# Patient Record
Sex: Female | Born: 1980 | Race: White | Hispanic: No | Marital: Single | Smoking: Former smoker
Health system: Southern US, Community
[De-identification: ages and names within clinical notes are randomized; demographics above are authoritative.]

## PROBLEM LIST (undated history)

## (undated) DIAGNOSIS — E669 Obesity, unspecified: Secondary | ICD-10-CM

## (undated) DIAGNOSIS — F419 Anxiety disorder, unspecified: Secondary | ICD-10-CM

## (undated) DIAGNOSIS — M1990 Unspecified osteoarthritis, unspecified site: Secondary | ICD-10-CM

## (undated) DIAGNOSIS — J449 Chronic obstructive pulmonary disease, unspecified: Secondary | ICD-10-CM

## (undated) DIAGNOSIS — J189 Pneumonia, unspecified organism: Secondary | ICD-10-CM

## (undated) DIAGNOSIS — E119 Type 2 diabetes mellitus without complications: Secondary | ICD-10-CM

## (undated) DIAGNOSIS — F319 Bipolar disorder, unspecified: Secondary | ICD-10-CM

## (undated) DIAGNOSIS — S2239XA Fracture of one rib, unspecified side, initial encounter for closed fracture: Secondary | ICD-10-CM

## (undated) DIAGNOSIS — F32A Depression, unspecified: Secondary | ICD-10-CM

## (undated) DIAGNOSIS — F329 Major depressive disorder, single episode, unspecified: Secondary | ICD-10-CM

## (undated) DIAGNOSIS — J439 Emphysema, unspecified: Secondary | ICD-10-CM

## (undated) HISTORY — PX: TUBAL LIGATION: SHX77

## (undated) HISTORY — PX: CHOLECYSTECTOMY: SHX55

---
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
## 2001-05-28 ENCOUNTER — Inpatient Hospital Stay (HOSPITAL_COMMUNITY): Admission: AD | Admit: 2001-05-28 | Discharge: 2001-05-28 | Payer: Self-pay | Admitting: *Deleted

## 2001-05-29 ENCOUNTER — Encounter: Admission: RE | Admit: 2001-05-29 | Discharge: 2001-05-29 | Payer: Self-pay | Admitting: *Deleted

## 2001-09-07 ENCOUNTER — Encounter: Admission: RE | Admit: 2001-09-07 | Discharge: 2001-09-07 | Payer: Self-pay | Admitting: Obstetrics and Gynecology

## 2001-10-22 ENCOUNTER — Inpatient Hospital Stay (HOSPITAL_COMMUNITY): Admission: AD | Admit: 2001-10-22 | Discharge: 2001-10-22 | Payer: Self-pay | Admitting: *Deleted

## 2001-11-26 ENCOUNTER — Ambulatory Visit (HOSPITAL_COMMUNITY): Admission: RE | Admit: 2001-11-26 | Discharge: 2001-11-26 | Payer: Self-pay | Admitting: *Deleted

## 2002-01-15 ENCOUNTER — Encounter: Admission: RE | Admit: 2002-01-15 | Discharge: 2002-01-15 | Payer: Self-pay | Admitting: *Deleted

## 2002-01-28 ENCOUNTER — Inpatient Hospital Stay (HOSPITAL_COMMUNITY): Admission: AD | Admit: 2002-01-28 | Discharge: 2002-01-28 | Payer: Self-pay | Admitting: *Deleted

## 2002-02-04 ENCOUNTER — Ambulatory Visit (HOSPITAL_COMMUNITY): Admission: RE | Admit: 2002-02-04 | Discharge: 2002-02-04 | Payer: Self-pay | Admitting: *Deleted

## 2002-03-07 ENCOUNTER — Encounter (INDEPENDENT_AMBULATORY_CARE_PROVIDER_SITE_OTHER): Payer: Self-pay

## 2002-03-07 ENCOUNTER — Other Ambulatory Visit: Admission: RE | Admit: 2002-03-07 | Discharge: 2002-03-07 | Payer: Self-pay | Admitting: *Deleted

## 2002-03-07 ENCOUNTER — Encounter: Admission: RE | Admit: 2002-03-07 | Discharge: 2002-03-07 | Payer: Self-pay | Admitting: Obstetrics and Gynecology

## 2002-04-12 ENCOUNTER — Inpatient Hospital Stay (HOSPITAL_COMMUNITY): Admission: AD | Admit: 2002-04-12 | Discharge: 2002-04-12 | Payer: Self-pay | Admitting: Family Medicine

## 2002-04-22 ENCOUNTER — Encounter (INDEPENDENT_AMBULATORY_CARE_PROVIDER_SITE_OTHER): Payer: Self-pay | Admitting: Specialist

## 2002-04-22 ENCOUNTER — Inpatient Hospital Stay (HOSPITAL_COMMUNITY): Admission: AD | Admit: 2002-04-22 | Discharge: 2002-04-25 | Payer: Self-pay | Admitting: Family Medicine

## 2002-04-28 ENCOUNTER — Inpatient Hospital Stay (HOSPITAL_COMMUNITY): Admission: AD | Admit: 2002-04-28 | Discharge: 2002-04-28 | Payer: Self-pay | Admitting: Obstetrics and Gynecology

## 2002-04-30 ENCOUNTER — Inpatient Hospital Stay (HOSPITAL_COMMUNITY): Admission: AD | Admit: 2002-04-30 | Discharge: 2002-04-30 | Payer: Self-pay | Admitting: Obstetrics and Gynecology

## 2002-11-14 ENCOUNTER — Encounter (INDEPENDENT_AMBULATORY_CARE_PROVIDER_SITE_OTHER): Payer: Self-pay | Admitting: *Deleted

## 2002-11-14 ENCOUNTER — Encounter: Admission: RE | Admit: 2002-11-14 | Discharge: 2002-11-14 | Payer: Self-pay | Admitting: Obstetrics and Gynecology

## 2002-11-14 ENCOUNTER — Other Ambulatory Visit: Admission: RE | Admit: 2002-11-14 | Discharge: 2002-11-14 | Payer: Self-pay | Admitting: Obstetrics and Gynecology

## 2002-11-14 ENCOUNTER — Encounter (INDEPENDENT_AMBULATORY_CARE_PROVIDER_SITE_OTHER): Payer: Self-pay

## 2002-11-22 ENCOUNTER — Emergency Department (HOSPITAL_COMMUNITY): Admission: EM | Admit: 2002-11-22 | Discharge: 2002-11-22 | Payer: Self-pay | Admitting: Emergency Medicine

## 2002-12-09 ENCOUNTER — Ambulatory Visit (HOSPITAL_COMMUNITY): Admission: RE | Admit: 2002-12-09 | Discharge: 2002-12-09 | Payer: Self-pay | Admitting: Orthopedic Surgery

## 2003-06-03 ENCOUNTER — Inpatient Hospital Stay (HOSPITAL_COMMUNITY): Admission: AD | Admit: 2003-06-03 | Discharge: 2003-06-03 | Payer: Self-pay | Admitting: *Deleted

## 2003-06-06 ENCOUNTER — Inpatient Hospital Stay (HOSPITAL_COMMUNITY): Admission: AD | Admit: 2003-06-06 | Discharge: 2003-06-06 | Payer: Self-pay | Admitting: *Deleted

## 2003-07-16 ENCOUNTER — Emergency Department (HOSPITAL_COMMUNITY): Admission: EM | Admit: 2003-07-16 | Discharge: 2003-07-16 | Payer: Self-pay | Admitting: Emergency Medicine

## 2003-09-04 ENCOUNTER — Encounter (INDEPENDENT_AMBULATORY_CARE_PROVIDER_SITE_OTHER): Payer: Self-pay | Admitting: Specialist

## 2003-09-04 ENCOUNTER — Encounter: Admission: RE | Admit: 2003-09-04 | Discharge: 2003-09-04 | Payer: Self-pay | Admitting: Obstetrics and Gynecology

## 2003-10-04 ENCOUNTER — Emergency Department (HOSPITAL_COMMUNITY): Admission: EM | Admit: 2003-10-04 | Discharge: 2003-10-04 | Payer: Self-pay | Admitting: Emergency Medicine

## 2004-01-04 ENCOUNTER — Emergency Department (HOSPITAL_COMMUNITY): Admission: EM | Admit: 2004-01-04 | Discharge: 2004-01-04 | Payer: Self-pay | Admitting: Family Medicine

## 2004-01-12 ENCOUNTER — Emergency Department (HOSPITAL_COMMUNITY): Admission: EM | Admit: 2004-01-12 | Discharge: 2004-01-12 | Payer: Self-pay | Admitting: Family Medicine

## 2005-03-15 ENCOUNTER — Ambulatory Visit: Payer: Self-pay | Admitting: *Deleted

## 2005-03-15 ENCOUNTER — Encounter (INDEPENDENT_AMBULATORY_CARE_PROVIDER_SITE_OTHER): Payer: Self-pay | Admitting: *Deleted

## 2005-04-27 ENCOUNTER — Other Ambulatory Visit: Admission: RE | Admit: 2005-04-27 | Discharge: 2005-04-27 | Payer: Self-pay | Admitting: Obstetrics & Gynecology

## 2005-04-27 ENCOUNTER — Ambulatory Visit: Payer: Self-pay | Admitting: Obstetrics & Gynecology

## 2005-05-12 ENCOUNTER — Ambulatory Visit: Payer: Self-pay | Admitting: Family Medicine

## 2005-05-13 ENCOUNTER — Ambulatory Visit (HOSPITAL_COMMUNITY): Admission: RE | Admit: 2005-05-13 | Discharge: 2005-05-13 | Payer: Self-pay | Admitting: *Deleted

## 2005-05-30 ENCOUNTER — Ambulatory Visit (HOSPITAL_COMMUNITY): Admission: RE | Admit: 2005-05-30 | Discharge: 2005-05-30 | Payer: Self-pay | Admitting: Family Medicine

## 2005-05-30 ENCOUNTER — Ambulatory Visit: Payer: Self-pay | Admitting: Family Medicine

## 2005-05-30 ENCOUNTER — Encounter (INDEPENDENT_AMBULATORY_CARE_PROVIDER_SITE_OTHER): Payer: Self-pay | Admitting: Specialist

## 2005-06-23 ENCOUNTER — Ambulatory Visit: Payer: Self-pay | Admitting: Obstetrics and Gynecology

## 2005-07-07 ENCOUNTER — Emergency Department (HOSPITAL_COMMUNITY): Admission: EM | Admit: 2005-07-07 | Discharge: 2005-07-07 | Payer: Self-pay | Admitting: Family Medicine

## 2005-10-31 ENCOUNTER — Inpatient Hospital Stay (HOSPITAL_COMMUNITY): Admission: AD | Admit: 2005-10-31 | Discharge: 2005-10-31 | Payer: Self-pay | Admitting: Gynecology

## 2005-11-16 ENCOUNTER — Emergency Department (HOSPITAL_COMMUNITY): Admission: EM | Admit: 2005-11-16 | Discharge: 2005-11-16 | Payer: Self-pay | Admitting: Emergency Medicine

## 2006-02-09 ENCOUNTER — Ambulatory Visit: Payer: Self-pay | Admitting: Obstetrics & Gynecology

## 2006-02-09 ENCOUNTER — Encounter: Payer: Self-pay | Admitting: Obstetrics and Gynecology

## 2006-02-26 ENCOUNTER — Emergency Department (HOSPITAL_COMMUNITY): Admission: EM | Admit: 2006-02-26 | Discharge: 2006-02-26 | Payer: Self-pay | Admitting: Family Medicine

## 2006-07-15 ENCOUNTER — Emergency Department (HOSPITAL_COMMUNITY): Admission: EM | Admit: 2006-07-15 | Discharge: 2006-07-15 | Payer: Self-pay | Admitting: Emergency Medicine

## 2006-07-20 ENCOUNTER — Ambulatory Visit: Payer: Self-pay | Admitting: Internal Medicine

## 2006-08-23 ENCOUNTER — Encounter: Payer: Self-pay | Admitting: Obstetrics and Gynecology

## 2006-08-23 ENCOUNTER — Ambulatory Visit: Payer: Self-pay | Admitting: Obstetrics and Gynecology

## 2006-09-22 ENCOUNTER — Emergency Department (HOSPITAL_COMMUNITY): Admission: EM | Admit: 2006-09-22 | Discharge: 2006-09-22 | Payer: Self-pay | Admitting: Emergency Medicine

## 2006-10-27 ENCOUNTER — Inpatient Hospital Stay (HOSPITAL_COMMUNITY): Admission: AD | Admit: 2006-10-27 | Discharge: 2006-10-27 | Payer: Self-pay | Admitting: Gynecology

## 2007-01-01 ENCOUNTER — Ambulatory Visit: Payer: Self-pay | Admitting: Internal Medicine

## 2007-02-07 ENCOUNTER — Ambulatory Visit: Payer: Self-pay | Admitting: Internal Medicine

## 2007-02-07 LAB — CONVERTED CEMR LAB
ALT: 12 units/L (ref 0–35)
AST: 11 units/L (ref 0–37)
Albumin: 4.4 g/dL (ref 3.5–5.2)
Alkaline Phosphatase: 69 units/L (ref 39–117)
BUN: 8 mg/dL (ref 6–23)
Basophils Absolute: 0 10*3/uL (ref 0.0–0.1)
Basophils Relative: 0 % (ref 0–1)
CO2: 24 meq/L (ref 19–32)
Calcium: 9.2 mg/dL (ref 8.4–10.5)
Chloride: 107 meq/L (ref 96–112)
Cholesterol: 145 mg/dL (ref 0–200)
Creatinine, Ser: 0.69 mg/dL (ref 0.40–1.20)
Eosinophils Absolute: 0.3 10*3/uL (ref 0.0–0.7)
Eosinophils Relative: 3 % (ref 0–5)
Glucose, Bld: 93 mg/dL (ref 70–99)
HCT: 42.3 % (ref 36.0–46.0)
HDL: 39 mg/dL — ABNORMAL LOW (ref >39)
Hemoglobin: 13.4 g/dL (ref 12.0–15.0)
LDL Cholesterol: 90 mg/dL (ref 0–99)
Lymphocytes Relative: 35 % (ref 12–46)
Lymphs Abs: 3.3 10*3/uL (ref 0.7–4.0)
MCHC: 31.7 g/dL (ref 30.0–36.0)
MCV: 90.2 fL (ref 78.0–100.0)
Monocytes Absolute: 0.6 10*3/uL (ref 0.1–1.0)
Monocytes Relative: 7 % (ref 3–12)
Neutro Abs: 5.3 10*3/uL (ref 1.7–7.7)
Neutrophils Relative %: 56 % (ref 43–77)
Platelets: 321 10*3/uL (ref 150–400)
Potassium: 4.3 meq/L (ref 3.5–5.3)
RBC: 4.69 M/uL (ref 3.87–5.11)
RDW: 13.9 % (ref 11.5–15.5)
Sodium: 140 meq/L (ref 135–145)
TSH: 2.118 microintl units/mL (ref 0.350–5.50)
Total Bilirubin: 0.5 mg/dL (ref 0.3–1.2)
Total CHOL/HDL Ratio: 3.7
Total Protein: 6.7 g/dL (ref 6.0–8.3)
Triglycerides: 79 mg/dL (ref <150)
VLDL: 16 mg/dL (ref 0–40)
WBC: 9.5 10*3/uL (ref 4.0–10.5)

## 2007-02-21 ENCOUNTER — Emergency Department (HOSPITAL_COMMUNITY): Admission: EM | Admit: 2007-02-21 | Discharge: 2007-02-21 | Payer: Self-pay | Admitting: Family Medicine

## 2007-05-31 ENCOUNTER — Ambulatory Visit: Payer: Self-pay | Admitting: Internal Medicine

## 2007-08-22 ENCOUNTER — Ambulatory Visit: Payer: Self-pay | Admitting: Internal Medicine

## 2007-11-09 ENCOUNTER — Encounter: Payer: Self-pay | Admitting: Obstetrics & Gynecology

## 2007-11-09 ENCOUNTER — Ambulatory Visit: Payer: Self-pay | Admitting: Obstetrics & Gynecology

## 2007-11-09 LAB — CONVERTED CEMR LAB

## 2007-11-14 ENCOUNTER — Emergency Department (HOSPITAL_COMMUNITY): Admission: EM | Admit: 2007-11-14 | Discharge: 2007-11-14 | Payer: Self-pay | Admitting: Family Medicine

## 2007-11-15 ENCOUNTER — Ambulatory Visit: Payer: Self-pay | Admitting: Internal Medicine

## 2008-03-27 ENCOUNTER — Emergency Department (HOSPITAL_COMMUNITY): Admission: EM | Admit: 2008-03-27 | Discharge: 2008-03-27 | Payer: Self-pay | Admitting: Emergency Medicine

## 2008-04-09 ENCOUNTER — Ambulatory Visit: Payer: Self-pay | Admitting: Internal Medicine

## 2008-04-15 ENCOUNTER — Ambulatory Visit: Payer: Self-pay | Admitting: Diagnostic Radiology

## 2008-04-15 ENCOUNTER — Emergency Department (HOSPITAL_BASED_OUTPATIENT_CLINIC_OR_DEPARTMENT_OTHER): Admission: EM | Admit: 2008-04-15 | Discharge: 2008-04-15 | Payer: Self-pay | Admitting: Emergency Medicine

## 2008-05-02 ENCOUNTER — Ambulatory Visit: Payer: Self-pay | Admitting: Family Medicine

## 2008-05-02 ENCOUNTER — Encounter: Payer: Self-pay | Admitting: Obstetrics and Gynecology

## 2008-05-02 LAB — CONVERTED CEMR LAB
ALT: <8 units/L (ref 0–35)
AST: 9 units/L (ref 0–37)
Albumin: 4.3 g/dL (ref 3.5–5.2)
Alkaline Phosphatase: 57 units/L (ref 39–117)
BUN: 5 mg/dL — ABNORMAL LOW (ref 6–23)
CO2: 21 meq/L (ref 19–32)
Calcium: 9 mg/dL (ref 8.4–10.5)
Chloride: 110 meq/L (ref 96–112)
Creatinine, Ser: 0.64 mg/dL (ref 0.40–1.20)
Glucose, Bld: 81 mg/dL (ref 70–99)
HCT: 37.7 % (ref 36.0–46.0)
HCV Ab: NEGATIVE
Hemoglobin: 12.1 g/dL (ref 12.0–15.0)
MCHC: 32.1 g/dL (ref 30.0–36.0)
MCV: 88.7 fL (ref 78.0–100.0)
Platelets: 292 10*3/uL (ref 150–400)
Potassium: 4 meq/L (ref 3.5–5.3)
RBC: 4.25 M/uL (ref 3.87–5.11)
RDW: 14.3 % (ref 11.5–15.5)
Sodium: 140 meq/L (ref 135–145)
TSH: 1.504 microintl units/mL (ref 0.350–4.500)
Total Bilirubin: 0.5 mg/dL (ref 0.3–1.2)
Total Protein: 6.3 g/dL (ref 6.0–8.3)
WBC: 9.1 10*3/uL (ref 4.0–10.5)

## 2008-05-05 ENCOUNTER — Ambulatory Visit (HOSPITAL_COMMUNITY): Admission: RE | Admit: 2008-05-05 | Discharge: 2008-05-05 | Payer: Self-pay | Admitting: Family Medicine

## 2008-05-20 ENCOUNTER — Ambulatory Visit: Payer: Self-pay | Admitting: Internal Medicine

## 2008-05-20 LAB — CONVERTED CEMR LAB
ALT: <8 units/L (ref 0–35)
AST: 9 units/L (ref 0–37)
Albumin: 4.3 g/dL (ref 3.5–5.2)
Alkaline Phosphatase: 52 units/L (ref 39–117)
BUN: 7 mg/dL (ref 6–23)
Basophils Absolute: 0 10*3/uL (ref 0.0–0.1)
Basophils Relative: 0 % (ref 0–1)
CO2: 22 meq/L (ref 19–32)
Calcium: 9.1 mg/dL (ref 8.4–10.5)
Chloride: 110 meq/L (ref 96–112)
Creatinine, Ser: 0.66 mg/dL (ref 0.40–1.20)
Eosinophils Absolute: 0.2 10*3/uL (ref 0.0–0.7)
Eosinophils Relative: 2 % (ref 0–5)
Glucose, Bld: 87 mg/dL (ref 70–99)
HCT: 37.7 % (ref 36.0–46.0)
Hemoglobin: 12.7 g/dL (ref 12.0–15.0)
Lymphocytes Relative: 30 % (ref 12–46)
Lymphs Abs: 3.1 10*3/uL (ref 0.7–4.0)
MCHC: 33.7 g/dL (ref 30.0–36.0)
MCV: 88.7 fL (ref 78.0–100.0)
Monocytes Absolute: 0.8 10*3/uL (ref 0.1–1.0)
Monocytes Relative: 7 % (ref 3–12)
Neutro Abs: 6.5 10*3/uL (ref 1.7–7.7)
Neutrophils Relative %: 61 % (ref 43–77)
Platelets: 245 10*3/uL (ref 150–400)
Potassium: 4.1 meq/L (ref 3.5–5.3)
RBC: 4.25 M/uL (ref 3.87–5.11)
RDW: 13.8 % (ref 11.5–15.5)
Sodium: 141 meq/L (ref 135–145)
Total Bilirubin: 0.6 mg/dL (ref 0.3–1.2)
Total Protein: 6.5 g/dL (ref 6.0–8.3)
WBC: 10.6 10*3/uL — ABNORMAL HIGH (ref 4.0–10.5)

## 2008-08-06 ENCOUNTER — Ambulatory Visit: Payer: Self-pay | Admitting: Internal Medicine

## 2008-10-29 ENCOUNTER — Telehealth (INDEPENDENT_AMBULATORY_CARE_PROVIDER_SITE_OTHER): Payer: Self-pay | Admitting: *Deleted

## 2008-10-30 ENCOUNTER — Ambulatory Visit: Payer: Self-pay | Admitting: Internal Medicine

## 2009-01-25 ENCOUNTER — Emergency Department (HOSPITAL_COMMUNITY): Admission: EM | Admit: 2009-01-25 | Discharge: 2009-01-25 | Payer: Self-pay | Admitting: Family Medicine

## 2009-03-17 ENCOUNTER — Ambulatory Visit: Payer: Self-pay | Admitting: Internal Medicine

## 2009-06-07 ENCOUNTER — Emergency Department (HOSPITAL_BASED_OUTPATIENT_CLINIC_OR_DEPARTMENT_OTHER): Admission: EM | Admit: 2009-06-07 | Discharge: 2009-06-07 | Payer: Self-pay | Admitting: Emergency Medicine

## 2009-06-09 ENCOUNTER — Telehealth (INDEPENDENT_AMBULATORY_CARE_PROVIDER_SITE_OTHER): Payer: Self-pay | Admitting: *Deleted

## 2009-06-09 ENCOUNTER — Emergency Department (HOSPITAL_BASED_OUTPATIENT_CLINIC_OR_DEPARTMENT_OTHER): Admission: EM | Admit: 2009-06-09 | Discharge: 2009-06-09 | Payer: Self-pay | Admitting: Emergency Medicine

## 2009-08-27 ENCOUNTER — Ambulatory Visit: Payer: Self-pay | Admitting: Internal Medicine

## 2009-09-22 ENCOUNTER — Emergency Department (HOSPITAL_BASED_OUTPATIENT_CLINIC_OR_DEPARTMENT_OTHER): Admission: EM | Admit: 2009-09-22 | Discharge: 2009-09-22 | Payer: Self-pay | Admitting: Emergency Medicine

## 2009-09-22 ENCOUNTER — Ambulatory Visit: Payer: Self-pay | Admitting: Diagnostic Radiology

## 2009-10-17 ENCOUNTER — Emergency Department (HOSPITAL_BASED_OUTPATIENT_CLINIC_OR_DEPARTMENT_OTHER): Admission: EM | Admit: 2009-10-17 | Discharge: 2009-10-17 | Payer: Self-pay | Admitting: Emergency Medicine

## 2009-10-17 ENCOUNTER — Ambulatory Visit: Payer: Self-pay | Admitting: Diagnostic Radiology

## 2009-12-08 ENCOUNTER — Ambulatory Visit: Payer: Self-pay | Admitting: Diagnostic Radiology

## 2009-12-08 ENCOUNTER — Emergency Department (HOSPITAL_BASED_OUTPATIENT_CLINIC_OR_DEPARTMENT_OTHER): Admission: EM | Admit: 2009-12-08 | Discharge: 2009-12-08 | Payer: Self-pay | Admitting: Emergency Medicine

## 2010-02-14 ENCOUNTER — Encounter: Payer: Self-pay | Admitting: Internal Medicine

## 2010-02-23 NOTE — Progress Notes (Signed)
Summary: triage/diarrhea  Phone Note Call from Patient   Caller: Patient Reason for Call: Talk to Nurse Summary of Call: patient states she went to [facility] ED on Sunday and they diagnosed a middle ear infection, pharynitis and upper lung resp. infection. They put her on amoxicillin. Her stomach started cramping on Sunday and then she started watery diarrhea 4-5 times a day. We do not have any appointments left today of tomorrow.Marland KitchenMarland KitchenDiscussed with Dr. Reche Dixon...patient can stop amoxicillin and see how she does for a couple of days.Marland KitchenMarland KitchenHe feels she is probably experiencing a virus.Marland KitchenMarland KitchenPatient advised, and states they had told her at ED it was a bacterial infection.Marland KitchenMarland KitchenAdvised patient she can either try Dr. Neita Carp suggestions or if not comfortable with that go to UC and we will give them authorization for her medicaid... Initial call taken by: Conchita Paris,  Jun 09, 2009 9:44 AM

## 2010-03-17 ENCOUNTER — Ambulatory Visit: Payer: Self-pay | Admitting: Family Medicine

## 2010-04-06 LAB — RAPID STREP SCREEN (MED CTR MEBANE ONLY): Streptococcus, Group A Screen (Direct): NEGATIVE

## 2010-04-07 ENCOUNTER — Ambulatory Visit: Payer: Self-pay | Admitting: Family Medicine

## 2010-04-08 LAB — URINALYSIS, ROUTINE W REFLEX MICROSCOPIC
Bilirubin Urine: NEGATIVE
Glucose, UA: NEGATIVE mg/dL
Hgb urine dipstick: NEGATIVE
Ketones, ur: NEGATIVE mg/dL
Nitrite: NEGATIVE
Protein, ur: NEGATIVE mg/dL
Specific Gravity, Urine: 1.004 — ABNORMAL LOW (ref 1.005–1.030)
Urobilinogen, UA: 0.2 mg/dL (ref 0.0–1.0)
pH: 6 (ref 5.0–8.0)

## 2010-04-08 LAB — PREGNANCY, URINE: Preg Test, Ur: NEGATIVE

## 2010-04-11 LAB — POCT URINALYSIS DIP (DEVICE)
Bilirubin Urine: NEGATIVE
Glucose, UA: NEGATIVE mg/dL
Hgb urine dipstick: NEGATIVE
Ketones, ur: NEGATIVE mg/dL
Nitrite: NEGATIVE
Protein, ur: NEGATIVE mg/dL
Specific Gravity, Urine: 1.02 (ref 1.005–1.030)
Urobilinogen, UA: 0.2 mg/dL (ref 0.0–1.0)
pH: 6 (ref 5.0–8.0)

## 2010-04-12 LAB — RAPID STREP SCREEN (MED CTR MEBANE ONLY): Streptococcus, Group A Screen (Direct): NEGATIVE

## 2010-05-05 LAB — POCT PREGNANCY, URINE: Preg Test, Ur: NEGATIVE

## 2010-05-21 ENCOUNTER — Emergency Department (HOSPITAL_COMMUNITY)
Admission: EM | Admit: 2010-05-21 | Discharge: 2010-05-21 | Disposition: A | Discharge status: Home / Self Care | Payer: Medicaid Other | Attending: Emergency Medicine | Admitting: Emergency Medicine

## 2010-05-21 DIAGNOSIS — F329 Major depressive disorder, single episode, unspecified: Secondary | ICD-10-CM | POA: Insufficient documentation

## 2010-05-21 DIAGNOSIS — F411 Generalized anxiety disorder: Secondary | ICD-10-CM | POA: Insufficient documentation

## 2010-05-21 DIAGNOSIS — R45851 Suicidal ideations: Secondary | ICD-10-CM | POA: Insufficient documentation

## 2010-05-21 DIAGNOSIS — F29 Unspecified psychosis not due to a substance or known physiological condition: Secondary | ICD-10-CM | POA: Insufficient documentation

## 2010-05-21 DIAGNOSIS — F39 Unspecified mood [affective] disorder: Secondary | ICD-10-CM

## 2010-05-21 DIAGNOSIS — F3289 Other specified depressive episodes: Secondary | ICD-10-CM | POA: Insufficient documentation

## 2010-05-21 LAB — DIFFERENTIAL
Basophils Relative: 0 % (ref 0–1)
Eosinophils Absolute: 0.3 10*3/uL (ref 0.0–0.7)
Eosinophils Relative: 2 % (ref 0–5)
Lymphs Abs: 4.2 10*3/uL — ABNORMAL HIGH (ref 0.7–4.0)
Monocytes Relative: 8 % (ref 3–12)

## 2010-05-21 LAB — URINALYSIS, ROUTINE W REFLEX MICROSCOPIC
Glucose, UA: NEGATIVE mg/dL
Hgb urine dipstick: NEGATIVE
Protein, ur: NEGATIVE mg/dL
Specific Gravity, Urine: 1.024 (ref 1.005–1.030)
pH: 5.5 (ref 5.0–8.0)

## 2010-05-21 LAB — BASIC METABOLIC PANEL
Calcium: 9.2 mg/dL (ref 8.4–10.5)
GFR calc Af Amer: >60 mL/min (ref >60)
GFR calc non Af Amer: >60 mL/min (ref >60)
Sodium: 141 mEq/L (ref 135–145)

## 2010-05-21 LAB — RAPID URINE DRUG SCREEN, HOSP PERFORMED
Amphetamines: NOT DETECTED
Tetrahydrocannabinol: NOT DETECTED

## 2010-05-21 LAB — CBC
HCT: 38.1 % (ref 36.0–46.0)
Hemoglobin: 12.9 g/dL (ref 12.0–15.0)
MCHC: 33.9 g/dL (ref 30.0–36.0)
RBC: 4.3 MIL/uL (ref 3.87–5.11)

## 2010-05-21 LAB — POCT PREGNANCY, URINE: Preg Test, Ur: NEGATIVE

## 2010-06-08 NOTE — Group Therapy Note (Signed)
[name], [name]           ACCOUNT NO.:  [id number]   MEDICAL RECORD NO.:  [id number]          PATIENT TYPE:  WOC   LOCATION:  [facility]                   FACILITY:  [facility]   PHYSICIAN:  Odie Sera, D.O.    DATE OF BIRTH:  10-13-1980   DATE OF SERVICE:  05/02/2008                                  CLINIC NOTE   REASON FOR VISIT:  Heavy, painful menstrual cycles.   HISTORY OF PRESENT ILLNESS:  Ms. Masullo is a 30 year old gravida 5,  para 3-0-2-3 who has had a bilateral tubal ligation procedure who  complaints of very heavy menstrual cycles typically lasting 7 days  associated with heavy bleeding clots as well as mild-to-moderate  cramping.  She notes this month, however, she has actually had 2 cycles  both similar in terms of duration and amount of bleeding.  She relates  that she feels fatigued quite regularly.  She also notes that she has a  history of approximately of 100-pound weight loss over the last 2 years  that has fairly been without significant efforts.  She denies doing any  vigorous exercise.  She notes that she has just had a decreased appetite  and only eat approximately once daily.   PAST MEDICAL HISTORY:  Significant for anxiety disorder.   PAST SURGICAL HISTORY:  Tubal sterilization.   PAST OBSTETRICAL HISTORY:  She has 3 term deliveries and 2 miscarriages.   PAST GYN HISTORY:  She has history of abnormal high-grade Pap smear with  a history of colposcopy.  Her last Pap smears have been negative.   SOCIAL HISTORY:  She is a smoker.  Occasional alcohol.  Denies drug use.   ALLERGIES:  No known drug allergies.   MEDICATIONS:  She takes Xanax 1 mg p.o. daily.   PHYSICAL EXAMINATION:  VITAL SIGNS:  Blood pressure of 111/78, heart  rate of 91, respiratory rate of 20, and temperature 97.8.  She is 5 feet  7 inches tall and weight is 166 pounds.  GENERAL:  She is healthy-appearing in no acute distress.  HEENT:  Oral exam is unremarkable except for  mildly enlarged right  tonsil.  There are no exudates or discharge noted from the tonsils.  NECK:  Supple.  She has no lymphadenopathy.  Her thyroid is normal in  size and no nodules are noted.  LUNGS:  Clear to auscultation.  CARDIOVASCULAR:  Regular rate without murmur.  ABDOMEN:  Soft.  She has some right upper quadrant tenderness to  palpation and the liver is palpated approximately 2 cm below the costal  margin.  She has normoactive bowel sounds.  There is no rebound or  guarding.  GU:  She has normal external female genitalia.  Her vaginal mucosa is  normal.  She has some mild amount of blood in the vault and obtained a  little bit of blood from the cervical os.  On bimanual exam, she has  normal-sized uterus.  No adnexal masses were palpated.  She did have  some tenderness with palpation of the right adnexa.  NEURO:  The patient has a flat affect.  She appears to have a depressed  mood.  Her mentation is normal.  Appearance is normal and her speech  flow is also normal.   ASSESSMENT AND PLAN:  1. Menometrorrhagia.  It is unclear the etiology of this; however, it      is potentially related to shift in estrogen related to her      significant weight loss.  However, thyroid abnormality could also      be a cause.  I opted the patient oral contraceptives as a method of      improving her bleeding and cramps associated with that.  She was      concerned about the weight gain associated with this; however, she      seemed willing to give this a try.  2. Fatigue.  We will check a CBC as well as a TSH today for further      evaluation of this.  However, I have a high suspicion that her      fatigue is most likely related to depression given my conversation      with the patient and her history of anxiety.  3. Anxiety disorder and most likely depression.  My interaction and      evaluation of her today, I feel that she probably has depression as      well as her anxiety.  I have asked  her if she has ever tried      medication like Zoloft or Paxil.  She relates she has been on for 2      days and then self-discontinued them because she did not like the      way that feel.  The high suspicion is that she most likely has a      combination of depression and anxiety disorder as well also      contribute to her fatigue.  She takes Xanax 1 mg daily, probably is      not the best therapy for this type of disorder.  However, I will      defer to her primary care Makensie Mulhall for further evaluation and      treatment of that.  Additionally, if her thyroid is markedly      abnormal that potentially could be contributing to her anxiety and      depression as well.  Labs would be drawn today a CBC, a CMP, TSH,      and a hepatitis C testing per the patient's request.  She will      return in 2 weeks for review of those results.  Additionally, she      would be given a prescription for Sprintec, oral contraceptive      pills, and I do not anticipate that there will be a marked      improvement 2 weeks, however, after 1 or 2 months, she will most      likely see some improvement.           ______________________________  Odie Sera, D.O.     MC/MEDQ  D:  05/02/2008  T:  05/03/2008  Job:  [id number]

## 2010-06-08 NOTE — Group Therapy Note (Signed)
[name], [name]           ACCOUNT NO.:  [id number]   MEDICAL RECORD NO.:  [id number]          PATIENT TYPE:  WOC   LOCATION:  [facility]                   FACILITY:  [facility]   PHYSICIAN:  Allie Bossier, MD        DATE OF BIRTH:  08-27-1980   DATE OF SERVICE:  11/09/2007                                  CLINIC NOTE   REASON FOR VISIT:  Complete physical exam with Pap.   HISTORY OF PRESENT ILLNESS:  Ms. Twaddell is a 30 year old G3, P3  female who comes in today for annual physical exam and Pap smear.  Of  note, she has lost approximately 60 pounds in the last year using diet  and exercise alone.  She also desires a sexually transmitted disease  check today including HIV and RPR.  She has tubal ligation for  contraception.  She endorses having regular periods.  Her last menstrual  period started on October 14, 2007, and she has no complaints today.   PAST MEDICAL HISTORY:  Anxiety.   MEDICATIONS:  Xanax 1 mg p.o. daily.   ALLERGIES:  No known drug allergies.   FAMILY HISTORY:  Significant for breast cancer in her grandmother and  cervical cancer in her mother.   SOCIAL HISTORY:  She does live with her husband and three daughters.  She is a smoker, occasional alcohol, and denies drugs.   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  Her last pap smear was negative.  She is just here  for followup.   PHYSICAL EXAMINATION:  VITAL SIGNS:  Temperature 98.2, pulse 89,  respirations 16, blood pressure 106/73, weight today is 184.8 pounds,  and height is 5 feet 8 inches tall.  GENERAL:  She is a alert, well-developed, well-nourished, well-hydrated  female in no acute distress.  NECK:  Supple with no masses appreciated.  CVS:  Regular rate and rhythm without murmur, rub, or gallop with 2+  pulses and 2+ radial and DP pulses bilaterally.  PULMONARY:  Lungs are clear to auscultation bilaterally without wheeze  or crackle and she  has normal work of breathing.  ABDOMEN:  Soft, nontender, and nondistended with positive bowel sounds.  GU:  Normal external genitalia. Vagina is pink and moist with normal  rugae.  Cervix is multiparous cervix visualized in the midline with no  obvious lesions and no friability.  Pap smear was obtained.  Perineal  exam revealed normal size of uterus and adnexa without mass or  tenderness.  EXTREMITIES:  Show no cyanosis, clubbing, or edema.  SKIN:  Shows no suspicious lesions.   ASSESSMENT AND PLAN:  This is a 30 year old female who is here for  annual physical exam and sexually transmitted disease check today.  1. Physical exam, Pap smear was obtained.  The patient was informed      that the results will be sent to her in the mail.  Rest of her      physical exam was normal, nothing  worrisome seen on exam today.  2. Sexually transmitted disease checking.  We will get  GC and      Chlamydia as well as draw HIV and RPR screening.  The patient will      be informed of each results.  She should follow up in 1 year for      her next physical exam.  She has had multiple normal Pap smear      since her last abnormal one.  She may now obtain annual pap smear.     ______________________________  Ardeen Garland, MD    ______________________________  Allie Bossier, MD    LM/MEDQ  D:  11/09/2007  T:  11/10/2007  Job:  [id number]

## 2010-06-11 NOTE — Op Note (Signed)
NAME:  [name], [name]                     ACCOUNT NO.:  [id number]   MEDICAL RECORD NO.:  [id number]                   PATIENT TYPE:  INP   LOCATION:  9198                                 [facility]   PHYSICIAN:  Phil D. Okey Dupre, M.D.                  DATE OF BIRTH:  04-Feb-1980   DATE OF PROCEDURE:  04/22/2002  DATE OF DISCHARGE:                                 OPERATIVE REPORT   PROCEDURE:  1. Low transverse cesarean section.  2. Bilateral tubal ligation, modified Pomeroy.   PREOPERATIVE DIAGNOSES:  1. Repeat cesarean section.  2. Term pregnancy.  3. Voluntary sterilization.   POSTOPERATIVE DIAGNOSES:  1. Repeat cesarean section.  2. Term pregnancy.  3. Voluntary sterilization.   SURGEON:  Javier Glazier. Okey Dupre, M.D.   ANESTHESIA:  Spinal.   PROCEDURE:  Under satisfactory spinal anesthesia with the patient in dorsal  supine position, a Foley catheter in the urinary bladder, the abdomen was  prepped and draped in the usual sterile manner and entered through a  transverse low abdominal incision through a previous surgical scar  approximately 4 cm above the symphysis pubis and extending for a total  length of 16 cm.  The abdomen was entered by layers.  On entering the  peritoneal cavity the visceroperitoneum and anterior surface of the uterus  opened transversely by sharp dissection.  Bladder pushed away from the lower  uterine segment which was entered by sharp and blunt dissection and from an  LOT presentation baby was easily delivered.  Female infant with Apgar 8 and  9.  Cord doubly clamped, divided, baby handed to pediatrician.  The placenta  was spontaneously removed and the uterus explored, closed with a continuous  running locked 0 Vicryl on an atraumatic needle.  Each fallopian tube was  then grasped in the mid portion and a hemostat placed through an avascular  portion of the meso beneath the tube and a 0 plain catgut suture was pulled  through this and tied around  the distal and proximal ends of the tube to  form a loop approximately 2 cm above the tie.  A second tie was placed just  below the first aforementioned tie and cut short and the section of tube  above the ties was excised and sent for pathological diagnosis.  The ends of  the tube within the ties were coagulated with hot cautery.  There seemed to  be a small hematoma forming in the meso beneath the tube on the left side  and a 0 suture was placed in that area to cut off the bleeding.  The area  was observed.  No further bleeding was noted and the fascia closed with  continuous running alternating locked 0 Vicryl on an atraumatic needle.  The  patient then transferred to  recovery room in satisfactory condition.  A dry sterile dressing was  applied.  Total blood loss during the  procedure was 700 mL.  The patient  tolerated the procedure well.  A Foley catheter was draining clear amber  urine at the end of the procedure.  Pathology was sent placenta as well as  two portions of fallopian tubes.                                               Phil D. Okey Dupre, M.D.    PDR/MEDQ  D:  04/22/2002  T:  04/22/2002  Job:  [id number]

## 2010-06-11 NOTE — Group Therapy Note (Signed)
NAMEMCKYNNA, VANLOAN           ACCOUNT NO.:  [id number]   MEDICAL RECORD NO.:  [id number]          PATIENT TYPE:  WOC   LOCATION:  [facility]                   FACILITY:  [facility]   PHYSICIAN:  Carolanne Grumbling, M.D.   DATE OF BIRTH:  [birth year]   DATE OF SERVICE:                                    CLINIC NOTE   A 30 year old G5, P3-0-2-3 here for a Pap smear.  The patient's mother was  recently diagnosed with cervical cancer that required her to have a  hysterectomy and it sounds like she had staging and one of her pelvic lymph  nodes were also removed.  The patient has a long history of abnormal Pap  smears.  According to review of the record, it looks her earliest abnormal 1  was in 2000, repeat 1 in 2001 showed inflammatory changes.  She also had a  colposcopy in 2003 that showed CIN-2 and a LEEP was ordered, but never done  because she was pregnant.  Her next Pap smear in 2003 showed low-grade cells  suspicious for high-grade cells.  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   The patient also complains of some menorrhagia and reports that she has a  period monthly  __________  and she is having to wear a pad and a tampon  simultaneously.  She changes her tampon approximately every 20 minutes.   PHYSICAL EXAMINATION:  VITAL SIGNS:  Temperature 98.6, blood pressure  116/80, pulse 109 and weight 248.8.  ABDOMEN:  Obese, nontender and nondistended.  GENITOURINARY:  Normal external genitalia.  The cervix is parous and no  lesions noted.  The uterus was within normal limits.  Adnexa free of masses.   IMPRESSION:  1.  Cervical dysplasia.  2.  Menorrhagia.  3.  Obesity.  4.  Question of history of hypothyroidism.   PLAN:  A Pap smear done today.  The patient will be notified of the results.  Encouraged her to consider getting the HPV vaccine since she is still a  candidate.  She can call the health department to find  out if this is  available.  We will check a TSH because she reports that was diagnosed with  hypothyroidism since her last visit, but she is out of her medicine.  This  could also attribute to her heavy vaginal bleeding, but it may also just be  simply because she is the estrogen effect of her fat tissue.           ______________________________  Carolanne Grumbling, M.D.     TW/MEDQ  D:  03/15/2005  T:  03/15/2005  Job:  [id number]

## 2010-06-11 NOTE — Op Note (Signed)
[facility]  Patient:    [name], [name]                  MRN: [id number] Proc. Date: 08/10/99 Adm. Date:  [id number] Attending:  Michaelle Copas                           Operative Report  PREOPERATIVE DIAGNOSIS:  Intrauterine pregnancy at term and breech in active labor.  POSTOPERATIVE DIAGNOSIS:  Intrauterine pregnancy at term and breech in active labor.  OPERATION:  Low transverse cesarean delivery.  SURGEON:  Conni Elliot, M.D.  ASSISTANT:  Talmage Nap, M.D.  OPERATIVE FINDINGS:  7 pound 3 ounce female with Apgars of 8 and 9.  Cord pH 7.33.  ANESTHESIA:  Spinal.  DESCRIPTION OF PROCEDURE:  After patient was given spinal anesthetic, patient supine left lateral tilt position, abdomen was prepped and draped in sterile fashion.  A low Pfannenstiel incision was made through the skin, subcutaneous, fascia, peritoneum was entered, bladder flap made, ____________ uterine incision made.  ____________ delivered breech ____________  presentation without difficulty and there was no intraoperative fetal head.  Cord doubly clamped and cut. Baby handed neonatologist in attendance.  Placenta ____________ Uterus, bladder flap, anterior peritoneum, fascia and skin closed ____________ .  ESTIMATED BLOOD LOSS:  Less than 800 cc. DD:  08/10/99 TD:  08/12/99 Job: [id number]

## 2010-06-11 NOTE — Group Therapy Note (Signed)
   NAME:  [name], [name]                     ACCOUNT NO.:  [id number]   MEDICAL RECORD NO.:  [id number]                   PATIENT TYPE:  OUT   LOCATION:  [facility]                           FACILITY:  [facility]   PHYSICIAN:  Tinnie Gens, MD                     DATE OF BIRTH:  [birth year]   DATE OF SERVICE:  11/14/2002                                    CLINIC NOTE   CHIEF COMPLAINT:  Abnormal Pap smear and amenorrhea.   HISTORY OF PRESENT ILLNESS:  The patient is a 30 year old gravida 2, para 2  who is status post C-section and BTL in March of this year.  Prior to that  time she had high-grade dysplasia on a Pap smear and a colposcopy biopsy.  She had been previously scheduled for a LEEP and came in and was pregnant at  the time.  So her LEEP was cancelled.  She had another abnormal Pap with low-  grade dysplasia and a colposcopy that suggested CIN 1 to 2 changes with no  biopsy done prior to delivery.  At this point, she returns and does need  follow up for her abnormal Pap.  She has not had a Pap since delivery.   The patient also complains today of amenorrhea over the last two months.  She does not know why she has not had a period.  She has had her tubes tied.  She has a negative urine pregnancy test this afternoon.   The patient was complaining of dysuria, vaginal itching and irritation, and  white discharge.   PHYSICAL EXAMINATION:  VITAL SIGNS:  Weight 257, blood pressure 115/74.  GENERAL:  She is an obese female in no acute distress.  GENITOURINARY:  She has normal external female genitalia.  The vagina has a  curdy white discharge consistent with yeast infection.  The cervix is parous  and the squamocolumnar junction is verified.  Acetic acid is placed.  Several acetowhite areas are noted, biopsies are taken, and an ECC as well.   IMPRESSION:  1. Abnormal Pap.  2. Amenorrhea unclear etiology.  3. Yeast vaginitis.   PLAN:  1. Pap and colposcopy biopsy results to  pathology.  2. Check TSH.  3. Diflucan 150 mg one p.o. daily x1 with one refill.  4. Return in two weeks for pathology results.                                               Tinnie Gens, MD    TP/MEDQ  D:  11/14/2002  T:  11/14/2002  Job:  [id number]

## 2010-06-11 NOTE — Discharge Summary (Signed)
[facility]  Patient:    [name], [name]                  MRN: [id number] Adm. Date:  [id number] Disc. Date: [id number] Attending:  Michaelle Copas Dictator:   Zella Ball, M.D.                           Discharge Summary  DATE OF BIRTH:                    July 19, 1980  ADMISSION DIAGNOSIS:              Term pregnancy with breach position.  DISCHARGE DIAGNOSIS:              Term pregnancy delivered by low transverse cesarean section.  PRESENTING HISTORY:               A 30 year old, G4, P1-0-2-1, at 37 weeks 5 days is sent from clinic for one night of uterine contractions without rupture of membranes as well as a nonreactive nonstress test in the clinic. There was also concern about breach presentation during the pregnancy because patients last ultrasound at 26-1/2 weeks was in breach presentation.  The patient was found to have a nonreactive nonstress test in the MAU, however, a normal BPP.  The patient was also found to have a cervix that was 2 cm dilated, 30% effaced, and fetal head of -3.  However, given the patients complaints of contraction and possible cervical change as well as breach presentation, the patient was admitted for observation to labor and delivery.  PROCEDURES:                       Low transverse C section performed 1:30 a.m. on August 11, 1999.  HOSPITAL COURSE:                  The patient was admitted to labor and delivery and was started on Unasyn 3 g IV q.6h. due to her GBS positive status.  The patient was taken back for low transverse C section due to the breach position of the fetus and was delivered of a viable female infant with Apgars of 8/9 at one and five minutes, respectively.  This report of the cesarean section was previously dictated by Dr. Gavin Potters.  The patients hospital recovery course was complicated by a temperature to 101.2 on postoperative day #2 as well as concomitant complaint of severe  headache and photophobia.  The patients temperature was believed due to endometritis, and the patient was continued on postoperative Unasyn dosing for 48 hours.  The headache and photophobia were believed to be a post spinal anesthesia headache.  After her fever, the patient recovered well and was afebrile for 48 hours prior to discharge.  The patient was give a shot of Depo-Provera prior to discharge and was bottle feeding her infant.  PERTINENT LABORATORY DATA:        The patient was GC and chlamydia negative, hepatitis B surface antigen negative, rubella immune, VDRL negative,  and was GBS positive.  Hemoglobin and creatinine on July 22, one day postoperative, showed hemoglobin 12.1 and hematocrit 35.3.  DISPOSITION:                      The patient was discharged to home.  DISCHARGE MEDICATIONS:  The patient was discharged on b.i.d. Augmentin for a total of a 14-day course of antibiotic treatment for endometritis and was also given ibuprofen 600 mg for pain control.  FOLLOWUP:                         The patient was instructed to follow up at the MAU in two days for staple removal as well as a six-week followup at [facility]. DD:  08/15/99 TD:  08/18/99 Job: [id number]

## 2010-06-11 NOTE — Discharge Summary (Signed)
   NAME:  [name], [name]                     ACCOUNT NO.:  [id number]   MEDICAL RECORD NO.:  [id number]                   PATIENT TYPE:  INP   LOCATION:  9113                                 [facility]ILITY[facility]   PHYSICIAN:  Phil D. Okey Dupre, M.D.                  DATE OF BIRTH:  11-Apr-1980   DATE OF ADMISSION:  04/22/2002  DATE OF DISCHARGE:  04/24/2002                                 DISCHARGE SUMMARY   REASON FOR HOSPITALIZATION:  A 30 year old gravida 5, para 3-0-2-3 with a 39  week intrauterine pregnancy presented for repeat cesarean section and  bilateral tubal ligation.  Cesarean section was performed on the day of  admission through previous surgical scar in low transverse abdomen.  The  baby had Apgars of 8 and 9.  Each fallopian tube was then tied in the usual  Pomeroy method.  The patient had an unremarkable postoperative course and  was discharged on third postoperative day.  She was bottle feeding.  Discharge instructions as to activity, follow-up, and diet were given to the  patient.  Skin staples were removed before discharge.  The wound was clean.  Physical examination was normal.  The patient will be followed up in the  maternal health in six weeks.                                               Phil D. Okey Dupre, M.D.    PDR/MEDQ  D:  05/17/2002  T:  05/17/2002  Job:  [id number]

## 2010-06-11 NOTE — Group Therapy Note (Signed)
NAME:  [name], [name]                     ACCOUNT NO.:  [id number]   MEDICAL RECORD NO.:  [id number]                   PATIENT TYPE:  OUT   LOCATION:  [facility]                           FACILITY:  [facility]   PHYSICIAN:  Tinnie Gens, MD                     DATE OF BIRTH:  1980/07/31   DATE OF SERVICE:  09/04/2003                                    CLINIC NOTE   CHIEF COMPLAINT:  Abnormal Pap.   HISTORY OF PRESENT ILLNESS:  The patient is a 30 year old gravida 2 para 2  who has a history of abnormal Pap smears.  She has low-grade on every one  with a negative colposcopic biopsy.  She is here today for follow-up on  that.   The patient also complains today of burning with urination, vaginal  discharge she reports has been happening since a condom broke during sex  several weeks ago.  She reports that she would like STD testing for all STDs  today.   PHYSICAL EXAMINATION:  GENERAL:  The patient is an obese female in no acute  distress.  VITAL SIGNS:  As noted in the chart.  GENITOURINARY:  She has normal external female genitalia.  The vagina is  rugated and pink.  The cervix is without lesion.  The uterus is small,  anteverted.  The adnexa are bilaterally tender.  Exam is significantly  limited by body habitus.   PLAN:  1. Repeat Pap smear today, follow up in 6 months.  2. STD check to include GC, chlamydia, HIV, and syphilis.                                               Tinnie Gens, MD    TP/MEDQ  D:  09/04/2003  T:  09/04/2003  Job:  [id number]

## 2010-06-11 NOTE — Op Note (Signed)
NAMELEVON, PENNING           ACCOUNT NO.:  [id number]   MEDICAL RECORD NO.:  [id number]          PATIENT TYPE:  AMB   LOCATION:  [facility]                           FACILITY:  [facility]   PHYSICIAN:  Tanya S. Shawnie Pons, M.D.   DATE OF BIRTH:  19-Oct-1980   DATE OF PROCEDURE:  05/30/2005  DATE OF DISCHARGE:                                 OPERATIVE REPORT   PREOPERATIVE DIAGNOSIS:  __________ .   POSTOPERATIVE DIAGNOSIS:  __________ .   PROCEDURE:  LEEP conization.   SURGEON:  Shelbie Proctor. Shawnie Pons, M.D.   ASSISTANT:  None.   ANESTHESIA:  MAC and local.   SPECIMENS:  Cervical cone.   ESTIMATED BLOOD LOSS:  50 cc.   REASON FOR PROCEDURE:  Briefly, the patient is a 30 year old para 3 who is  status post BTL, who has __________  and desired definitive treatment with a  LEEP conization.  She was unable to tolerate the procedure in the office and  was scheduled for the operating room.   PROCEDURE:  Patient was taken to the OR.  She was placed in dorsal lithotomy  with Allen stirrups.  She was prepped and draped in the usual sterile  fashion.  __________  with a Bovie pad.  A Teflon-coated speculum inside the  vagina.  The cervix was visualized.  Acetic acid was applied, and colposcopy  was performed.  There was acetyl white area between 11 and 1 o'clock.  The  edges could not be seen up into the endocervical canal.  The rest of the  __________  looked well.  A paracervical block was then performed with 10 cc  of 1% lidocaine with epinephrine.  A LEEP conization was done with a Fisher  cone biopsy excisor, a size large shallow.  The bed of the conization was  cauterized with electrocautery, and hemostasis was obtained using Monsel  solution.  At the end of the procedure, all instruments were removed from  the vagina, and hemostasis was good.  All instrument and lap counts were  correct x2.  The patient was awakened and taken to the recovery room in  stable condition.     ______________________________  Shelbie Proctor Shawnie Pons, M.D.     TSP/MEDQ  D:  05/30/2005  T:  05/31/2005  Job:  [id number]

## 2010-09-06 ENCOUNTER — Ambulatory Visit: Payer: Medicaid Other | Admitting: Family Medicine

## 2010-11-04 LAB — WET PREP, GENITAL
Trich, Wet Prep: NONE SEEN
Yeast Wet Prep HPF POC: NONE SEEN

## 2010-11-04 LAB — URINE MICROSCOPIC-ADD ON

## 2010-11-04 LAB — CBC
MCV: 87.4
Platelets: 309
RDW: 13.9
WBC: 16.6 — ABNORMAL HIGH

## 2010-11-04 LAB — URINALYSIS, ROUTINE W REFLEX MICROSCOPIC
Bilirubin Urine: NEGATIVE
Ketones, ur: NEGATIVE
Nitrite: NEGATIVE
Urobilinogen, UA: 0.2
pH: 5

## 2010-11-04 LAB — POCT PREGNANCY, URINE
Operator id: 113551
Preg Test, Ur: NEGATIVE

## 2010-11-04 LAB — GC/CHLAMYDIA PROBE AMP, GENITAL: Chlamydia, DNA Probe: NEGATIVE

## 2011-01-29 ENCOUNTER — Encounter: Payer: Self-pay | Admitting: *Deleted

## 2011-01-29 ENCOUNTER — Emergency Department (HOSPITAL_COMMUNITY)
Admission: EM | Admit: 2011-01-29 | Discharge: 2011-01-29 | Disposition: A | Discharge status: Home / Self Care | Payer: Medicaid Other | Attending: Emergency Medicine | Admitting: Emergency Medicine

## 2011-01-29 DIAGNOSIS — F411 Generalized anxiety disorder: Secondary | ICD-10-CM | POA: Insufficient documentation

## 2011-01-29 DIAGNOSIS — J4489 Other specified chronic obstructive pulmonary disease: Secondary | ICD-10-CM | POA: Insufficient documentation

## 2011-01-29 DIAGNOSIS — J449 Chronic obstructive pulmonary disease, unspecified: Secondary | ICD-10-CM | POA: Insufficient documentation

## 2011-01-29 DIAGNOSIS — R259 Unspecified abnormal involuntary movements: Secondary | ICD-10-CM | POA: Insufficient documentation

## 2011-01-29 DIAGNOSIS — F410 Panic disorder [episodic paroxysmal anxiety] without agoraphobia: Secondary | ICD-10-CM | POA: Insufficient documentation

## 2011-01-29 HISTORY — DX: Anxiety disorder, unspecified: F41.9

## 2011-01-29 HISTORY — DX: Chronic obstructive pulmonary disease, unspecified: J44.9

## 2011-01-29 MED ORDER — CLONAZEPAM 0.5 MG PO TABS
1.0000 mg | ORAL_TABLET | Freq: Every day | ORAL | Status: DC
Start: 2011-01-29 — End: 2011-01-29
  Administered 2011-01-29: 1 mg via ORAL
  Filled 2011-01-29: qty 2

## 2011-01-29 MED ORDER — LORAZEPAM 1 MG PO TABS
1.0000 mg | ORAL_TABLET | Freq: Once | ORAL | Status: AC
  Administered 2011-01-29: 1 mg via ORAL
  Filled 2011-01-29: qty 1

## 2011-01-29 MED ORDER — CLONAZEPAM 0.5 MG PO TABS: 1.0000 mg | ORAL_TABLET | Freq: Two times a day (BID) | ORAL | Status: AC | PRN

## 2011-01-29 NOTE — ED Provider Notes (Signed)
History     CSN: [id number]  Arrival date & time 01/29/11  7829   First MD Initiated Contact with Patient 01/29/11 [phone]      Chief Complaint  Patient presents with  . Anxiety    (Consider location/radiation/quality/duration/timing/severity/associated sxs/prior treatment) Patient is a 31 y.o. female presenting with anxiety. The history is provided by the patient. No language interpreter was used.  Anxiety This is a recurrent problem. The current episode started yesterday. The problem occurs constantly. The problem has been gradually worsening. Pertinent negatives include no abdominal pain, chest pain, coughing, fever, headaches, nausea, neck pain, numbness or weakness. The symptoms are aggravated by nothing. She has tried nothing for the symptoms. The treatment provided moderate relief.   31 year old patient presents via EMS to the ER today with complaint of anxiety attack. States that she ran out of her Klonopin and trazodone 2 days ago and has been nervous and having panic attacks since. States that she has been restless and trembling. And trembling presently and hyperventilating.  Past Medical History  Diagnosis Date  . COPD (chronic obstructive pulmonary disease)   . Asthma   . Anxiety     History reviewed. No pertinent past surgical history.  History reviewed. No pertinent family history.  History  Substance Use Topics  . Smoking status: Not on file  . Smokeless tobacco: Not on file  . Alcohol Use:     OB History    Grav Para Term Preterm Abortions TAB SAB Ect Mult Living                  Review of Systems  Constitutional: Negative for fever.  HENT: Negative for neck pain.   Respiratory: Negative for cough.   Cardiovascular: Negative for chest pain.  Gastrointestinal: Negative for nausea and abdominal pain.  Neurological: Negative for weakness, numbness and headaches.  All other systems reviewed and are negative.    Allergies  Review of patient's allergies  indicates no known allergies.  Home Medications   Current Outpatient Rx  Name Route Sig Dispense Refill  . ALBUTEROL SULFATE HFA 108 (90 BASE) MCG/ACT IN AERS Inhalation Inhale 2 puffs into the lungs every 6 (six) hours as needed. For shortness of breath.     . ARIPIPRAZOLE 10 MG PO TABS Oral Take 10 mg by mouth daily.     Marland Kitchen CLONAZEPAM 1 MG PO TABS Oral Take 1 mg by mouth 2 (two) times daily as needed. For anxiety.    . ESCITALOPRAM OXALATE 20 MG PO TABS Oral Take 20 mg by mouth daily.      Marland Kitchen GABAPENTIN 300 MG PO CAPS Oral Take 300 mg by mouth 3 (three) times daily.      . TRAZODONE HCL PO Oral Take 1 tablet by mouth at bedtime.        BP 113/43  Pulse 79  Temp(Src) 97.3 F (36.3 C) (Oral)  Resp 16  SpO2 100%  Physical Exam  Nursing note and vitals reviewed. Constitutional: She is oriented to person, place, and time. She appears well-developed and well-nourished. She appears distressed.  HENT:  Head: Normocephalic.  Eyes: Pupils are equal, round, and reactive to light.  Neck: Normal range of motion.  Cardiovascular: Normal rate.   Pulmonary/Chest: Effort normal and breath sounds normal.  Abdominal: Soft. She exhibits no distension. There is no tenderness. There is no rebound.  Musculoskeletal: Normal range of motion.  Neurological: She is alert and oriented to person, place, and time.  Skin: Skin is  warm and dry. No erythema.  Psychiatric: She has a normal mood and affect.    ED Course  Procedures (including critical care time)  Labs Reviewed - No data to display No results found.   No diagnosis found.    MDM  Patient presents today with severe anxiety and panic attack. She ran out of her Klonopin and trazodone 2 days ago. We will give her prescription for the Klonopin and trazodone and she will followup on the 14th that top priority with Dr. Henreitta Leber. She received 1 mg of Ativan and 1 mg of Klonopin in the ER today with good results and she would like to go home now.           Jethro Bastos, NP 01/29/11 [phone]

## 2011-01-29 NOTE — ED Notes (Signed)
Patient is resting comfortably. 

## 2011-01-29 NOTE — ED Notes (Signed)
Vital signs stable. 

## 2011-01-29 NOTE — ED Notes (Signed)
Patient denies pain and is resting comfortably.  

## 2011-01-29 NOTE — ED Notes (Signed)
MD at bedside / P.A. @ BEDSIDE

## 2011-01-29 NOTE — ED Notes (Signed)
EMS called to home.  Found patient sitting on couch.  She is complaining of anxiety due to out of medication Patient is stable and has no complaints of pain.

## 2011-01-30 NOTE — ED Provider Notes (Signed)
Medical screening examination/treatment/procedure(s) were performed by non-physician practitioner and as supervising physician I was immediately available for consultation/collaboration. Marissa Rawl Y.   [name]. Jaedin Regina, MD 01/30/11 1610

## 2012-05-06 ENCOUNTER — Emergency Department (HOSPITAL_BASED_OUTPATIENT_CLINIC_OR_DEPARTMENT_OTHER)
Admission: EM | Admit: 2012-05-06 | Discharge: 2012-05-06 | Disposition: A | Discharge status: Home / Self Care | Payer: Medicaid Other | Attending: Emergency Medicine | Admitting: Emergency Medicine

## 2012-05-06 ENCOUNTER — Emergency Department (HOSPITAL_BASED_OUTPATIENT_CLINIC_OR_DEPARTMENT_OTHER): Payer: Medicaid Other

## 2012-05-06 ENCOUNTER — Encounter (HOSPITAL_BASED_OUTPATIENT_CLINIC_OR_DEPARTMENT_OTHER): Payer: Self-pay

## 2012-05-06 DIAGNOSIS — J4489 Other specified chronic obstructive pulmonary disease: Secondary | ICD-10-CM | POA: Insufficient documentation

## 2012-05-06 DIAGNOSIS — Z87891 Personal history of nicotine dependence: Secondary | ICD-10-CM | POA: Insufficient documentation

## 2012-05-06 DIAGNOSIS — R042 Hemoptysis: Secondary | ICD-10-CM | POA: Insufficient documentation

## 2012-05-06 DIAGNOSIS — F411 Generalized anxiety disorder: Secondary | ICD-10-CM | POA: Insufficient documentation

## 2012-05-06 DIAGNOSIS — R110 Nausea: Secondary | ICD-10-CM | POA: Insufficient documentation

## 2012-05-06 DIAGNOSIS — J449 Chronic obstructive pulmonary disease, unspecified: Secondary | ICD-10-CM | POA: Insufficient documentation

## 2012-05-06 DIAGNOSIS — R079 Chest pain, unspecified: Secondary | ICD-10-CM | POA: Insufficient documentation

## 2012-05-06 DIAGNOSIS — Z79899 Other long term (current) drug therapy: Secondary | ICD-10-CM | POA: Insufficient documentation

## 2012-05-06 DIAGNOSIS — Z8709 Personal history of other diseases of the respiratory system: Secondary | ICD-10-CM | POA: Insufficient documentation

## 2012-05-06 HISTORY — DX: Emphysema, unspecified: J43.9

## 2012-05-06 LAB — CBC WITH DIFFERENTIAL/PLATELET
Eosinophils Relative: 2 % (ref 0–5)
Hemoglobin: 12.7 g/dL (ref 12.0–15.0)
Lymphocytes Relative: 21 % (ref 12–46)
Lymphs Abs: 2.5 10*3/uL (ref 0.7–4.0)
MCV: 90.1 fL (ref 78.0–100.0)
Platelets: 276 10*3/uL (ref 150–400)
RBC: 4.26 MIL/uL (ref 3.87–5.11)
WBC: 11.9 10*3/uL — ABNORMAL HIGH (ref 4.0–10.5)

## 2012-05-06 LAB — COMPREHENSIVE METABOLIC PANEL
ALT: 41 U/L — ABNORMAL HIGH (ref 0–35)
Alkaline Phosphatase: 69 U/L (ref 39–117)
CO2: 25 mEq/L (ref 19–32)
Calcium: 8.5 mg/dL (ref 8.4–10.5)
GFR calc Af Amer: >90 mL/min (ref >90)
GFR calc non Af Amer: >90 mL/min (ref >90)
Glucose, Bld: 99 mg/dL (ref 70–99)
Potassium: 4 mEq/L (ref 3.5–5.1)
Sodium: 138 mEq/L (ref 135–145)

## 2012-05-06 MED ORDER — IOHEXOL 350 MG/ML SOLN
100.0000 mL | Freq: Once | INTRAVENOUS | Status: AC | PRN
  Administered 2012-05-06: 100 mL via INTRAVENOUS

## 2012-05-06 MED ORDER — ONDANSETRON 4 MG PO TBDP
4.0000 mg | ORAL_TABLET | Freq: Once | ORAL | Status: AC
  Administered 2012-05-06: 4 mg via ORAL
  Filled 2012-05-06: qty 1

## 2012-05-06 NOTE — ED Notes (Signed)
Pt states that she has copd and emphysema pt states that she woke up coughing up blood today.  Pt states that it was a lot of blood and a slight amount of phlegm

## 2012-05-06 NOTE — ED Provider Notes (Addendum)
History     CSN: [id number]  Arrival date & time 05/06/12  1146   First MD Initiated Contact with Patient 05/06/12 1201      Chief Complaint  Patient presents with  . Cough    (Consider location/radiation/quality/duration/timing/severity/associated sxs/prior treatment) Patient is a 32 y.o. female presenting with cough.  Cough Cough characteristics:  Productive (this morning had multiple episodes of hemoptysis (6 in total) with frank blood and one episode with dark clot) Sputum characteristics:  Bloody Severity:  Moderate Onset quality:  Sudden Timing:  Intermittent Progression:  Unchanged Chronicity:  New Smoker: prior smoker quit 1 year ago.   Relieved by:  Nothing Worsened by:  Nothing tried Ineffective treatments:  None tried Associated symptoms: chest pain   Associated symptoms: no fever, no rash, no rhinorrhea, no shortness of breath, no sinus congestion and no wheezing   Associated symptoms comment:  Mild amt of chest pain last night that resolved.  No pain now but mild nausea Risk factors: no recent infection     Past Medical History  Diagnosis Date  . COPD (chronic obstructive pulmonary disease)   . Asthma   . Anxiety   . Emphysema     History reviewed. No pertinent past surgical history.  History reviewed. No pertinent family history.  History  Substance Use Topics  . Smoking status: Former Smoker    Quit date: 05/07/2011  . Smokeless tobacco: Not on file  . Alcohol Use: No    OB History   Grav Para Term Preterm Abortions TAB SAB Ect Mult Living                  Review of Systems  Constitutional: Negative for fever.  HENT: Negative for nosebleeds, congestion and rhinorrhea.   Respiratory: Positive for cough. Negative for shortness of breath and wheezing.   Cardiovascular: Positive for chest pain.  Skin: Negative for rash.  All other systems reviewed and are negative.    Allergies  Review of patient's allergies indicates no known  allergies.  Home Medications   Current Outpatient Rx  Name  Route  Sig  Dispense  Refill  . albuterol (PROVENTIL HFA;VENTOLIN HFA) 108 (90 BASE) MCG/ACT inhaler   Inhalation   Inhale 2 puffs into the lungs every 6 (six) hours as needed. For shortness of breath.          . ARIPiprazole (ABILIFY) 10 MG tablet   Oral   Take 10 mg by mouth daily.          . clonazePAM (KLONOPIN) 0.5 MG tablet   Oral   Take 2 tablets (1 mg total) by mouth 2 (two) times daily as needed for anxiety (take 1mg  every am and .5mg  every pm ).   15 tablet   0   . clonazePAM (KLONOPIN) 1 MG tablet   Oral   Take 1 mg by mouth 2 (two) times daily as needed. For anxiety.         Marland Kitchen escitalopram (LEXAPRO) 20 MG tablet   Oral   Take 20 mg by mouth daily.           Marland Kitchen gabapentin (NEURONTIN) 300 MG capsule   Oral   Take 300 mg by mouth 3 (three) times daily.           . Propranolol HCl (INDERAL PO)   Oral   Take by mouth daily.         . TRAZODONE HCL PO   Oral   Take 1  tablet by mouth at bedtime.             BP 126/81  Pulse 95  Temp(Src) 98.5 F (36.9 C) (Oral)  Resp 20  Ht 5\' 7"  (1.702 m)  Wt 255 lb 9.6 oz (115.939 kg)  BMI 40.02 kg/m2  SpO2 97%  LMP 04/29/2012  Physical Exam  Nursing note and vitals reviewed. Constitutional: She is oriented to person, place, and time. She appears well-developed and well-nourished. No distress.  Morbidly obese  HENT:  Head: Normocephalic and atraumatic.  Mouth/Throat: Oropharynx is clear and moist.  Eyes: Conjunctivae and EOM are normal. Pupils are equal, round, and reactive to light.  Neck: Normal range of motion. Neck supple.  Cardiovascular: Normal rate, regular rhythm and intact distal pulses.   No murmur heard. Pulmonary/Chest: Effort normal and breath sounds normal. No respiratory distress. She has no wheezes. She has no rales.  Abdominal: Soft. She exhibits no distension. There is no tenderness. There is no rebound and no guarding.   Musculoskeletal: Normal range of motion. She exhibits no edema and no tenderness.  Neurological: She is alert and oriented to person, place, and time.  Skin: Skin is warm and dry. No rash noted. No erythema.  Psychiatric: She has a normal mood and affect. Her behavior is normal.    ED Course  Procedures (including critical care time)  Labs Reviewed  CBC WITH DIFFERENTIAL - Abnormal; Notable for the following:    WBC 11.9 (*)    Neutro Abs 7.9 (*)    Monocytes Absolute 1.2 (*)    All other components within normal limits  COMPREHENSIVE METABOLIC PANEL - Abnormal; Notable for the following:    Albumin 3.3 (*)    ALT 41 (*)    Total Bilirubin 0.2 (*)    All other components within normal limits  D-DIMER, QUANTITATIVE - Abnormal; Notable for the following:    D-Dimer, Quant 0.75 (*)    All other components within normal limits   Dg Chest 2 View  05/06/2012  *RADIOLOGY REPORT*  Clinical Data: COPD. Hemoptysis.  CHEST - 2 VIEW  Comparison: PA and lateral chest 12/08/2009.  Findings: The lungs are clear.  Heart size is upper normal.  No pneumothorax or pleural effusion.  IMPRESSION: No acute disease.   Original Report Authenticated By: Holley Dexter, M.D.    Ct Angio Chest Pe W/cm &/or Wo Cm  05/06/2012  *RADIOLOGY REPORT*  Clinical Data: Hemoptysis.  Elevated D-dimer.  Leukocytosis with white blood count of 11,900.  Current history of COPD, emphysema, and asthma.  CT ANGIOGRAPHY CHEST  Technique:  Multidetector CT imaging of the chest using the standard protocol during bolus administration of intravenous contrast. Multiplanar reconstructed images including MIPs were obtained and reviewed to evaluate the vascular anatomy.  Contrast: OMNIPAQUE IOHEXOL 350 MG/ML SOLN  Comparison: None.  Findings: Contrast opacification of the pulmonary arteries is good. No filling defects within either main pulmonary artery or their branches in either lung to suggest pulmonary embolism.  Heart size  normal.  No visible coronary atherosclerosis.  No visible atherosclerosis involving the thoracic or upper abdominal aorta or their visualized proximal branches.  No pericardial effusion.  Expected dependent atelectasis posteriorly in the lower lobes. Lungs otherwise clear without localized airspace consolidation, interstitial disease, or parenchymal nodules or masses.  No pleural effusions.  Central airways patent without significant bronchial wall thickening.  No significant mediastinal, hilar, or axillary lymphadenopathy. Visualized lower thyroid gland normal in appearance.  Visualized extreme upper abdomen  unremarkable.  Bone window images demonstrate thoracic scoliosis convex right.  IMPRESSION:  1.  No evidence pulmonary embolism. 2.  No acute cardiopulmonary disease.  Expected dependent atelectasis posteriorly in the lower lobes.   Original Report Authenticated By: Hulan Saas, M.D.      1. Hemoptysis, unspecified       MDM    Pt here due to hemoptysis today x6 after waking up without other sx.  States no vomiting blood all from coughing and no recent infection, epistaxis or URI/congestion.  States she would feel fine today other than the bleeding.  No hx of similar.  Prior smoker but quit 1 year ago and hx of COPD but no wheezing.  Low suspicion for infection today.  Low risk well's but will send dimer.  CBC< CMP pending.  CXR pending.  VS wnl and O2 sats wnl.  States she has some mild CP last night but none currently and no hx of abd pain.  1:02 PM Dimer elevated and will get CT.  2:44 PM CT neg.  Will d/c home with non-specific hemoptysis     Gwyneth Sprout, MD 05/06/12 1302  Gwyneth Sprout, MD 05/06/12 1443  Gwyneth Sprout, MD 05/06/12 1444

## 2012-05-23 ENCOUNTER — Emergency Department (HOSPITAL_BASED_OUTPATIENT_CLINIC_OR_DEPARTMENT_OTHER): Payer: Medicaid Other

## 2012-05-23 ENCOUNTER — Emergency Department (HOSPITAL_BASED_OUTPATIENT_CLINIC_OR_DEPARTMENT_OTHER)
Admission: EM | Admit: 2012-05-23 | Discharge: 2012-05-23 | Disposition: A | Discharge status: Home / Self Care | Payer: Medicaid Other | Attending: Emergency Medicine | Admitting: Emergency Medicine

## 2012-05-23 ENCOUNTER — Encounter (HOSPITAL_BASED_OUTPATIENT_CLINIC_OR_DEPARTMENT_OTHER): Payer: Self-pay

## 2012-05-23 DIAGNOSIS — J4489 Other specified chronic obstructive pulmonary disease: Secondary | ICD-10-CM | POA: Insufficient documentation

## 2012-05-23 DIAGNOSIS — S199XXA Unspecified injury of neck, initial encounter: Secondary | ICD-10-CM | POA: Insufficient documentation

## 2012-05-23 DIAGNOSIS — Y92410 Unspecified street and highway as the place of occurrence of the external cause: Secondary | ICD-10-CM | POA: Insufficient documentation

## 2012-05-23 DIAGNOSIS — M549 Dorsalgia, unspecified: Secondary | ICD-10-CM

## 2012-05-23 DIAGNOSIS — J449 Chronic obstructive pulmonary disease, unspecified: Secondary | ICD-10-CM | POA: Insufficient documentation

## 2012-05-23 DIAGNOSIS — S4980XA Other specified injuries of shoulder and upper arm, unspecified arm, initial encounter: Secondary | ICD-10-CM | POA: Insufficient documentation

## 2012-05-23 DIAGNOSIS — Z8709 Personal history of other diseases of the respiratory system: Secondary | ICD-10-CM | POA: Insufficient documentation

## 2012-05-23 DIAGNOSIS — Z79899 Other long term (current) drug therapy: Secondary | ICD-10-CM | POA: Insufficient documentation

## 2012-05-23 DIAGNOSIS — S0993XA Unspecified injury of face, initial encounter: Secondary | ICD-10-CM | POA: Insufficient documentation

## 2012-05-23 DIAGNOSIS — IMO0002 Reserved for concepts with insufficient information to code with codable children: Secondary | ICD-10-CM | POA: Insufficient documentation

## 2012-05-23 DIAGNOSIS — Z87891 Personal history of nicotine dependence: Secondary | ICD-10-CM | POA: Insufficient documentation

## 2012-05-23 DIAGNOSIS — M542 Cervicalgia: Secondary | ICD-10-CM

## 2012-05-23 DIAGNOSIS — F411 Generalized anxiety disorder: Secondary | ICD-10-CM | POA: Insufficient documentation

## 2012-05-23 DIAGNOSIS — S46909A Unspecified injury of unspecified muscle, fascia and tendon at shoulder and upper arm level, unspecified arm, initial encounter: Secondary | ICD-10-CM | POA: Insufficient documentation

## 2012-05-23 DIAGNOSIS — Y9389 Activity, other specified: Secondary | ICD-10-CM | POA: Insufficient documentation

## 2012-05-23 MED ORDER — HYDROCODONE-ACETAMINOPHEN 5-325 MG PO TABS: 2.0000 | ORAL_TABLET | ORAL | Status: DC | PRN

## 2012-05-23 MED ORDER — OXYCODONE-ACETAMINOPHEN 5-325 MG PO TABS
1.0000 | ORAL_TABLET | Freq: Once | ORAL | Status: AC
  Administered 2012-05-23: 1 via ORAL
  Filled 2012-05-23 (×2): qty 1

## 2012-05-23 NOTE — ED Notes (Signed)
Care assumed
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
## 2012-05-23 NOTE — ED Provider Notes (Signed)
History/physical exam/procedure(s) were performed by non-physician practitioner and as supervising physician I was immediately available for consultation/collaboration. I have reviewed all notes and am in agreement with care and plan.   Kamica Florance S Camilia Caywood, MD 05/23/12 1401 
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
## 2012-05-28 ENCOUNTER — Ambulatory Visit: Payer: Medicaid Other | Admitting: Advanced Practice Midwife

## 2012-06-20 ENCOUNTER — Ambulatory Visit: Payer: Medicaid Other | Admitting: Obstetrics and Gynecology

## 2012-07-05 ENCOUNTER — Ambulatory Visit: Payer: Medicaid Other | Admitting: Obstetrics & Gynecology

## 2012-07-12 ENCOUNTER — Other Ambulatory Visit: Payer: Self-pay | Admitting: Specialist

## 2012-07-12 DIAGNOSIS — M545 Low back pain: Secondary | ICD-10-CM

## 2012-07-23 ENCOUNTER — Other Ambulatory Visit: Payer: Medicaid Other

## 2012-07-25 ENCOUNTER — Ambulatory Visit
Admission: RE | Admit: 2012-07-25 | Discharge: 2012-07-25 | Disposition: A | Discharge status: Home / Self Care | Payer: Medicaid Other | Source: Ambulatory Visit | Attending: Specialist | Admitting: Specialist

## 2012-07-25 DIAGNOSIS — M545 Low back pain: Secondary | ICD-10-CM

## 2012-07-27 ENCOUNTER — Emergency Department (HOSPITAL_BASED_OUTPATIENT_CLINIC_OR_DEPARTMENT_OTHER): Payer: Medicaid Other

## 2012-07-27 ENCOUNTER — Encounter (HOSPITAL_BASED_OUTPATIENT_CLINIC_OR_DEPARTMENT_OTHER): Payer: Self-pay | Admitting: *Deleted

## 2012-07-27 ENCOUNTER — Emergency Department (HOSPITAL_BASED_OUTPATIENT_CLINIC_OR_DEPARTMENT_OTHER)
Admission: EM | Admit: 2012-07-27 | Discharge: 2012-07-27 | Disposition: A | Discharge status: Home / Self Care | Payer: Medicaid Other | Attending: Emergency Medicine | Admitting: Emergency Medicine

## 2012-07-27 DIAGNOSIS — S93409A Sprain of unspecified ligament of unspecified ankle, initial encounter: Secondary | ICD-10-CM | POA: Insufficient documentation

## 2012-07-27 DIAGNOSIS — J4489 Other specified chronic obstructive pulmonary disease: Secondary | ICD-10-CM | POA: Insufficient documentation

## 2012-07-27 DIAGNOSIS — S92301A Fracture of unspecified metatarsal bone(s), right foot, initial encounter for closed fracture: Secondary | ICD-10-CM

## 2012-07-27 DIAGNOSIS — S8261XA Displaced fracture of lateral malleolus of right fibula, initial encounter for closed fracture: Secondary | ICD-10-CM

## 2012-07-27 DIAGNOSIS — S8263XA Displaced fracture of lateral malleolus of unspecified fibula, initial encounter for closed fracture: Secondary | ICD-10-CM | POA: Insufficient documentation

## 2012-07-27 DIAGNOSIS — J449 Chronic obstructive pulmonary disease, unspecified: Secondary | ICD-10-CM | POA: Insufficient documentation

## 2012-07-27 DIAGNOSIS — Y939 Activity, unspecified: Secondary | ICD-10-CM | POA: Insufficient documentation

## 2012-07-27 DIAGNOSIS — S93402A Sprain of unspecified ligament of left ankle, initial encounter: Secondary | ICD-10-CM

## 2012-07-27 DIAGNOSIS — Z87891 Personal history of nicotine dependence: Secondary | ICD-10-CM | POA: Insufficient documentation

## 2012-07-27 DIAGNOSIS — Z79899 Other long term (current) drug therapy: Secondary | ICD-10-CM | POA: Insufficient documentation

## 2012-07-27 DIAGNOSIS — Y9289 Other specified places as the place of occurrence of the external cause: Secondary | ICD-10-CM | POA: Insufficient documentation

## 2012-07-27 DIAGNOSIS — F411 Generalized anxiety disorder: Secondary | ICD-10-CM | POA: Insufficient documentation

## 2012-07-27 DIAGNOSIS — S92309A Fracture of unspecified metatarsal bone(s), unspecified foot, initial encounter for closed fracture: Secondary | ICD-10-CM | POA: Insufficient documentation

## 2012-07-27 DIAGNOSIS — W108XXA Fall (on) (from) other stairs and steps, initial encounter: Secondary | ICD-10-CM | POA: Insufficient documentation

## 2012-07-27 MED ORDER — HYDROCODONE-ACETAMINOPHEN 5-325 MG PO TABS
2.0000 | ORAL_TABLET | Freq: Once | ORAL | Status: AC
  Administered 2012-07-27: 2 via ORAL
  Filled 2012-07-27: qty 2

## 2012-07-27 MED ORDER — OXYCODONE-ACETAMINOPHEN 5-325 MG PO TABS: 2.0000 | ORAL_TABLET | ORAL | Status: DC | PRN
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
## 2012-07-27 NOTE — ED Notes (Signed)
I applied cam walker to patient's right foot. Patient barely able to tolerate application of cam walker, patient started crying and clinging to spouse. Patient states she cannot tolerate crutch fitting or training due to pain of both ankles. I notified nurse and taking patient out by wheelchair.

## 2012-07-27 NOTE — ED Notes (Signed)
Spoke with Pt. When in to check her vitals and explained to her she was very sleepy with the meds I had already given to her.  Pt. Asking for a med stronger than the vicodin.

## 2012-07-27 NOTE — ED Notes (Signed)
Pt. Has positive pedal pulses bilat.

## 2012-07-27 NOTE — ED Provider Notes (Signed)
History    CSN: [id number] Arrival date & time 07/27/12  1545  First MD Initiated Contact with Patient 07/27/12 1621     Chief Complaint  Patient presents with  . Fall   (Consider location/radiation/quality/duration/timing/severity/associated sxs/prior Treatment) Patient is a 32 y.o. female presenting with fall. The history is provided by the patient. No language interpreter was used.  Fall This is a new problem. The current episode started today. The problem occurs constantly. The problem has been gradually worsening. Associated symptoms include joint swelling. Nothing aggravates the symptoms. She has tried nothing for the symptoms. The treatment provided mild relief.  Pt fell down steps  And turned right ankle, left ankle and right foot  Past Medical History  Diagnosis Date  . COPD (chronic obstructive pulmonary disease)   . Asthma   . Anxiety   . Emphysema    Past Surgical History  Procedure Laterality Date  . Cholecystectomy     No family history on file. History  Substance Use Topics  . Smoking status: Former Smoker    Quit date: 05/07/2011  . Smokeless tobacco: Not on file  . Alcohol Use: No   OB History   Grav Para Term Preterm Abortions TAB SAB Ect Mult Living                 Review of Systems  Musculoskeletal: Positive for joint swelling.  Skin: Positive for wound.  All other systems reviewed and are negative.    Allergies  Review of patient's allergies indicates no known allergies.  Home Medications   Current Outpatient Rx  Name  Route  Sig  Dispense  Refill  . albuterol (PROVENTIL HFA;VENTOLIN HFA) 108 (90 BASE) MCG/ACT inhaler   Inhalation   Inhale 2 puffs into the lungs every 6 (six) hours as needed. For shortness of breath.          . EXPIRED: clonazePAM (KLONOPIN) 0.5 MG tablet   Oral   Take 2 tablets (1 mg total) by mouth 2 (two) times daily as needed for anxiety (take 1mg  every am and .5mg  every pm ).   15 tablet   0   . clonazePAM  (KLONOPIN) 1 MG tablet   Oral   Take 1 mg by mouth 2 (two) times daily as needed. For anxiety.         Marland Kitchen escitalopram (LEXAPRO) 20 MG tablet   Oral   Take 20 mg by mouth daily.           Marland Kitchen gabapentin (NEURONTIN) 300 MG capsule   Oral   Take 300 mg by mouth 3 (three) times daily.           Marland Kitchen HYDROcodone-acetaminophen (NORCO/VICODIN) 5-325 MG per tablet   Oral   Take 2 tablets by mouth every 4 (four) hours as needed for pain.   10 tablet   0   . Lurasidone HCl (LATUDA PO)   Oral   Take by mouth.         . Propranolol HCl (INDERAL PO)   Oral   Take by mouth daily.         . TRAZODONE HCL PO   Oral   Take 1 tablet by mouth at bedtime.            BP 122/77  Temp(Src) 99.1 F (37.3 C) (Oral)  Resp 26  Wt 248 lb (112.492 kg)  BMI 37.72 kg/m2  SpO2 98%  LMP 07/13/2012 Physical Exam  Nursing note and vitals reviewed.  Constitutional: She is oriented to person, place, and time. She appears well-developed and well-nourished.  HENT:  Head: Normocephalic.  Musculoskeletal: She exhibits tenderness.  Swelling left ankle,  Abrasion left leg,  Right ankle swollen,  Swollen right foot,   Healing abrasion right knee,  Neurological: She is alert and oriented to person, place, and time. She has normal reflexes.  Skin: Skin is warm.  Psychiatric: She has a normal mood and affect.    ED Course  Procedures (including critical care time) Labs Reviewed - No data to display Dg Ankle Complete Left  07/27/2012   *RADIOLOGY REPORT*  Clinical Data: Fall  LEFT ANKLE COMPLETE - 3+ VIEW  Comparison: None.  Findings: There is no evidence of fracture or dislocation.  There is no evidence of arthropathy or other focal bone abnormality. Soft tissues are unremarkable.  IMPRESSION: Negative exam.   Original Report Authenticated By: Signa Kell, M.D.   Dg Ankle Complete Right  07/27/2012   *RADIOLOGY REPORT*  Clinical Data: Fall, ankle laceration bilaterally  RIGHT ANKLE - COMPLETE 3+  VIEW  Comparison: 09/22/2009  Findings: Significant soft tissue swelling diffusely. Ankle mortise intact. Minimally displaced fracture at tip of lateral malleolus. No additional fracture, dislocation, or bone destruction.  IMPRESSION: Minimally displaced fracture at tip of lateral malleolus.   Original Report Authenticated By: Ulyses Southward, M.D.   1. Fx metatarsal, right, closed, initial encounter   2. Fracture of right ankle, lateral malleolus, closed, initial encounter   3. Sprain of left ankle, initial encounter     MDM  RN reports pt difficult to arouse after pain medication,   On re exam vitals normal,  Pt sleepy but alert  Xray of foot shows 5th metatarsal fx,   Pt placed in ace for left ankle,  Walker for right and crutches.   Pt advised to follow up with Dr. Rennis Chris.  Pt request stronger pain medication.   I discussed with pt and she admits to taking klonopin.  (Pt's speech is slurred)   I asked about alcohol.   Pt denies.  Pt advised not to take any more klonopin while taking narcotic pain medication.   Pt advised of possible respiratory suppression.    Lonia Skinner [place], PA-C 07/27/12 1949

## 2012-07-27 NOTE — ED Provider Notes (Signed)
Medical screening examination/treatment/procedure(s) were performed by non-physician practitioner and as supervising physician I was immediately available for consultation/collaboration.  Geoffery Lyons, MD 07/27/12 2000

## 2012-07-27 NOTE — ED Notes (Signed)
Slipped going down steps and injuried her left ankle and right ankle and foot. Abrasion to her left lower leg and posterior ankle.

## 2012-08-01 ENCOUNTER — Other Ambulatory Visit: Payer: Medicaid Other

## 2012-08-08 ENCOUNTER — Other Ambulatory Visit: Payer: Medicaid Other

## 2012-08-15 ENCOUNTER — Ambulatory Visit: Admission: RE | Admit: 2012-08-15 | Payer: Medicaid Other | Source: Ambulatory Visit

## 2012-08-15 ENCOUNTER — Ambulatory Visit
Admission: RE | Admit: 2012-08-15 | Discharge: 2012-08-15 | Disposition: A | Discharge status: Home / Self Care | Payer: Medicaid Other | Source: Ambulatory Visit | Attending: Specialist | Admitting: Specialist

## 2012-08-15 ENCOUNTER — Other Ambulatory Visit: Payer: Self-pay | Admitting: Specialist

## 2012-08-15 DIAGNOSIS — M545 Low back pain: Secondary | ICD-10-CM

## 2012-10-08 ENCOUNTER — Emergency Department (HOSPITAL_BASED_OUTPATIENT_CLINIC_OR_DEPARTMENT_OTHER)
Admission: EM | Admit: 2012-10-08 | Discharge: 2012-10-08 | Disposition: A | Discharge status: Other Acute Inpatient Hospital | Payer: Medicaid Other

## 2012-10-24 ENCOUNTER — Encounter (HOSPITAL_BASED_OUTPATIENT_CLINIC_OR_DEPARTMENT_OTHER): Payer: Self-pay | Admitting: Emergency Medicine

## 2012-10-24 ENCOUNTER — Emergency Department (HOSPITAL_BASED_OUTPATIENT_CLINIC_OR_DEPARTMENT_OTHER)
Admission: EM | Admit: 2012-10-24 | Discharge: 2012-10-24 | Disposition: A | Discharge status: Home / Self Care | Payer: Medicaid Other | Attending: Emergency Medicine | Admitting: Emergency Medicine

## 2012-10-24 DIAGNOSIS — J438 Other emphysema: Secondary | ICD-10-CM | POA: Insufficient documentation

## 2012-10-24 DIAGNOSIS — F411 Generalized anxiety disorder: Secondary | ICD-10-CM | POA: Insufficient documentation

## 2012-10-24 DIAGNOSIS — N644 Mastodynia: Secondary | ICD-10-CM | POA: Insufficient documentation

## 2012-10-24 DIAGNOSIS — Z87891 Personal history of nicotine dependence: Secondary | ICD-10-CM | POA: Insufficient documentation

## 2012-10-24 DIAGNOSIS — Z79899 Other long term (current) drug therapy: Secondary | ICD-10-CM | POA: Insufficient documentation

## 2012-10-24 DIAGNOSIS — J45909 Unspecified asthma, uncomplicated: Secondary | ICD-10-CM | POA: Insufficient documentation

## 2012-10-24 NOTE — ED Provider Notes (Signed)
Medical screening examination/treatment/procedure(s) were performed by non-physician practitioner and as supervising physician I was immediately available for consultation/collaboration.  Layla Maw Lennyn Gange, DO 10/24/12 1515

## 2012-10-24 NOTE — ED Provider Notes (Signed)
CSN: [id number]     Arrival date & time 10/24/12  1403 History   First MD Initiated Contact with Patient 10/24/12 1413     Chief Complaint  Patient presents with  . Breast Pain    R   (Consider location/radiation/quality/duration/timing/severity/associated sxs/prior Treatment) HPI Comments: Pt states that she has been having right breast pain for the last week:has taken ibuprofen without relief:denies redness or drainage:denies similar history  The history is provided by the patient. No language interpreter was used.    Past Medical History  Diagnosis Date  . COPD (chronic obstructive pulmonary disease)   . Asthma   . Anxiety   . Emphysema    Past Surgical History  Procedure Laterality Date  . Cholecystectomy     History reviewed. No pertinent family history. History  Substance Use Topics  . Smoking status: Former Smoker    Quit date: 05/07/2011  . Smokeless tobacco: Not on file  . Alcohol Use: No   OB History   Grav Para Term Preterm Abortions TAB SAB Ect Mult Living                 Review of Systems  Constitutional: Negative.   Respiratory: Negative.   Cardiovascular: Negative.     Allergies  Review of patient's allergies indicates no known allergies.  Home Medications   Current Outpatient Rx  Name  Route  Sig  Dispense  Refill  . albuterol (PROVENTIL HFA;VENTOLIN HFA) 108 (90 BASE) MCG/ACT inhaler   Inhalation   Inhale 2 puffs into the lungs every 6 (six) hours as needed. For shortness of breath.          . EXPIRED: clonazePAM (KLONOPIN) 0.5 MG tablet   Oral   Take 2 tablets (1 mg total) by mouth 2 (two) times daily as needed for anxiety (take 1mg  every am and .5mg  every pm ).   15 tablet   0   . clonazePAM (KLONOPIN) 1 MG tablet   Oral   Take 1 mg by mouth 2 (two) times daily as needed. For anxiety.         Marland Kitchen escitalopram (LEXAPRO) 20 MG tablet   Oral   Take 20 mg by mouth daily.           Marland Kitchen gabapentin (NEURONTIN) 300 MG capsule   Oral   Take 300 mg by mouth 3 (three) times daily.           . Lurasidone HCl (LATUDA PO)   Oral   Take by mouth.         . Propranolol HCl (INDERAL PO)   Oral   Take by mouth daily.         . TRAZODONE HCL PO   Oral   Take 1 tablet by mouth at bedtime.            BP 115/91  Pulse 85  Temp(Src) 98.7 F (37.1 C) (Oral)  Resp 18  Ht 5\' 8"  (1.727 m)  Wt 215 lb (97.523 kg)  BMI 32.7 kg/m2  SpO2 98%  LMP 10/23/2012 Physical Exam  Nursing note and vitals reviewed. Constitutional: She is oriented to person, place, and time. She appears well-developed and well-nourished.  Cardiovascular: Normal rate and regular rhythm.   Pulmonary/Chest: Effort normal and breath sounds normal.  Genitourinary:  No redness drainage or abnormal masses felt in the right breast  Musculoskeletal: Normal range of motion.  Neurological: She is alert and oriented to person, place, and time.  Skin: Skin  is warm and dry.    ED Course  Procedures (including critical care time) Labs Review Labs Reviewed - No data to display Imaging Review No results found.  MDM   1. Breast pain in female    No abnormality noted:pt just finished her cycle   Teressa Lower, NP 10/24/12 1444

## 2012-10-24 NOTE — ED Notes (Signed)
Pt c/o R breast pain. Taking ibuprofen x 1 week w/o relief.

## 2012-11-09 ENCOUNTER — Ambulatory Visit: Payer: Medicaid Other | Admitting: Obstetrics & Gynecology

## 2012-12-31 ENCOUNTER — Encounter (HOSPITAL_BASED_OUTPATIENT_CLINIC_OR_DEPARTMENT_OTHER): Payer: Self-pay | Admitting: Emergency Medicine

## 2012-12-31 ENCOUNTER — Emergency Department (HOSPITAL_BASED_OUTPATIENT_CLINIC_OR_DEPARTMENT_OTHER)
Admission: EM | Admit: 2012-12-31 | Discharge: 2012-12-31 | Disposition: A | Discharge status: Home / Self Care | Payer: Medicaid Other | Attending: Emergency Medicine | Admitting: Emergency Medicine

## 2012-12-31 DIAGNOSIS — Z23 Encounter for immunization: Secondary | ICD-10-CM | POA: Insufficient documentation

## 2012-12-31 DIAGNOSIS — F329 Major depressive disorder, single episode, unspecified: Secondary | ICD-10-CM | POA: Insufficient documentation

## 2012-12-31 DIAGNOSIS — J4489 Other specified chronic obstructive pulmonary disease: Secondary | ICD-10-CM | POA: Insufficient documentation

## 2012-12-31 DIAGNOSIS — F3289 Other specified depressive episodes: Secondary | ICD-10-CM | POA: Insufficient documentation

## 2012-12-31 DIAGNOSIS — F411 Generalized anxiety disorder: Secondary | ICD-10-CM | POA: Insufficient documentation

## 2012-12-31 DIAGNOSIS — Z87891 Personal history of nicotine dependence: Secondary | ICD-10-CM | POA: Insufficient documentation

## 2012-12-31 DIAGNOSIS — S301XXA Contusion of abdominal wall, initial encounter: Secondary | ICD-10-CM | POA: Insufficient documentation

## 2012-12-31 DIAGNOSIS — Y929 Unspecified place or not applicable: Secondary | ICD-10-CM | POA: Insufficient documentation

## 2012-12-31 DIAGNOSIS — S31109A Unspecified open wound of abdominal wall, unspecified quadrant without penetration into peritoneal cavity, initial encounter: Secondary | ICD-10-CM | POA: Insufficient documentation

## 2012-12-31 DIAGNOSIS — J449 Chronic obstructive pulmonary disease, unspecified: Secondary | ICD-10-CM | POA: Insufficient documentation

## 2012-12-31 DIAGNOSIS — Z79899 Other long term (current) drug therapy: Secondary | ICD-10-CM | POA: Insufficient documentation

## 2012-12-31 DIAGNOSIS — Y939 Activity, unspecified: Secondary | ICD-10-CM | POA: Insufficient documentation

## 2012-12-31 DIAGNOSIS — W540XXA Bitten by dog, initial encounter: Secondary | ICD-10-CM | POA: Insufficient documentation

## 2012-12-31 DIAGNOSIS — IMO0002 Reserved for concepts with insufficient information to code with codable children: Secondary | ICD-10-CM | POA: Insufficient documentation

## 2012-12-31 HISTORY — DX: Depression, unspecified: F32.A

## 2012-12-31 HISTORY — DX: Major depressive disorder, single episode, unspecified: F32.9

## 2012-12-31 MED ORDER — RABIES VACCINE, PCEC IM SUSR
1.0000 mL | Freq: Once | INTRAMUSCULAR | Status: AC
  Administered 2012-12-31: 1 mL via INTRAMUSCULAR
  Filled 2012-12-31: qty 1

## 2012-12-31 MED ORDER — ACETAMINOPHEN 500 MG PO TABS
1000.0000 mg | ORAL_TABLET | Freq: Once | ORAL | Status: AC
  Administered 2012-12-31: 1000 mg via ORAL
  Filled 2012-12-31: qty 2

## 2012-12-31 MED ORDER — AMOXICILLIN-POT CLAVULANATE 875-125 MG PO TABS: 1.0000 | ORAL_TABLET | Freq: Two times a day (BID) | ORAL | Status: DC

## 2012-12-31 MED ORDER — RABIES IMMUNE GLOBULIN 150 UNIT/ML IM INJ
INJECTION | INTRAMUSCULAR | Status: AC
  Administered 2012-12-31: 2250 [IU] via INTRAMUSCULAR
  Filled 2012-12-31: qty 12

## 2012-12-31 MED ORDER — TETANUS-DIPHTH-ACELL PERTUSSIS 5-2.5-18.5 LF-MCG/0.5 IM SUSP
0.5000 mL | Freq: Once | INTRAMUSCULAR | Status: AC
  Administered 2012-12-31: 0.5 mL via INTRAMUSCULAR
  Filled 2012-12-31: qty 0.5

## 2012-12-31 MED ORDER — RABIES IMMUNE GLOBULIN 150 UNIT/ML IM INJ
20.0000 [IU]/kg | INJECTION | Freq: Once | INTRAMUSCULAR | Status: AC
  Filled 2012-12-31: qty 4

## 2012-12-31 NOTE — ED Notes (Signed)
Animal bite to right side of abdomen.  Unknown dog.

## 2012-12-31 NOTE — ED Provider Notes (Signed)
CSN: [id number]     Arrival date & time 12/31/12  1106 History   First MD Initiated Contact with Patient 12/31/12 1124     Chief Complaint  Patient presents with  . Animal Bite   (Consider location/radiation/quality/duration/timing/severity/associated sxs/prior Treatment) Patient is a 32 y.o. female presenting with animal bite.  Animal Bite Contact animal:  Dog Animal bite location: right abdomen. Time since incident:  1 hour Pain details:    Quality:  Sharp   Severity:  Moderate   Timing:  Constant   Progression:  Unchanged Incident location: Pt pulled over to the side of the road to get an address from her trunk. Provoked: unprovoked   Animal's rabies vaccination status:  Unknown Animal in possession: no   Tetanus status:  Unknown Relieved by:  Nothing Worsened by:  Nothing tried Ineffective treatments:  None tried Associated symptoms: swelling   Associated symptoms: no fever and no numbness     Past Medical History  Diagnosis Date  . COPD (chronic obstructive pulmonary disease)   . Asthma   . Anxiety   . Emphysema   . Depression    Past Surgical History  Procedure Laterality Date  . Cholecystectomy     No family history on file. History  Substance Use Topics  . Smoking status: Former Smoker    Quit date: 05/07/2011  . Smokeless tobacco: Not on file  . Alcohol Use: No   OB History   Grav Para Term Preterm Abortions TAB SAB Ect Mult Living                 Review of Systems  Constitutional: Negative for fever.  Neurological: Negative for numbness.  All other systems reviewed and are negative.    Allergies  Review of patient's allergies indicates no known allergies.  Home Medications   Current Outpatient Rx  Name  Route  Sig  Dispense  Refill  . albuterol (PROVENTIL HFA;VENTOLIN HFA) 108 (90 BASE) MCG/ACT inhaler   Inhalation   Inhale 2 puffs into the lungs every 6 (six) hours as needed. For shortness of breath.          . EXPIRED: clonazePAM  (KLONOPIN) 0.5 MG tablet   Oral   Take 2 tablets (1 mg total) by mouth 2 (two) times daily as needed for anxiety (take 1mg  every am and .5mg  every pm ).   15 tablet   0   . clonazePAM (KLONOPIN) 1 MG tablet   Oral   Take 1 mg by mouth 2 (two) times daily as needed. For anxiety.         Marland Kitchen escitalopram (LEXAPRO) 20 MG tablet   Oral   Take 20 mg by mouth daily.           Marland Kitchen gabapentin (NEURONTIN) 300 MG capsule   Oral   Take 300 mg by mouth 3 (three) times daily.           . Lurasidone HCl (LATUDA PO)   Oral   Take by mouth.         . Propranolol HCl (INDERAL PO)   Oral   Take by mouth daily.         . TRAZODONE HCL PO   Oral   Take 1 tablet by mouth at bedtime.            BP 113/88  Pulse 104  Temp(Src) 98.2 F (36.8 C) (Oral)  Resp 20  Ht 5\' 7"  (1.702 m)  Wt 250 lb (113.399  kg)  BMI 39.15 kg/m2  SpO2 98%  LMP 11/27/2012 Physical Exam  Nursing note and vitals reviewed. Constitutional: She is oriented to person, place, and time. She appears well-developed and well-nourished. No distress.  HENT:  Head: Normocephalic and atraumatic.  Eyes: Conjunctivae are normal. No scleral icterus.  Neck: Neck supple.  Cardiovascular: Normal rate and intact distal pulses.   Pulmonary/Chest: Effort normal. No stridor. No respiratory distress.  Abdominal: Normal appearance. She exhibits no distension. There is no tenderness. There is no rigidity, no rebound and no guarding.  Several abrasions and mild ecchymosis on skin overlying pannus of right lower abdomen.    Neurological: She is alert and oriented to person, place, and time.  Skin: Skin is warm and dry. No rash noted.  Psychiatric: She has a normal mood and affect. Her behavior is normal.    ED Course  Procedures (including critical care time) Labs Review Labs Reviewed - No data to display Imaging Review No results found.  EKG Interpretation   None       MDM   1. Dog bite, initial encounter    Pt  bit by an unknown dog in an unfamiliar neighborhood.  Unprovoked attack.  She will require rabies vaccination series.  Will also update tDap.  Wounds do not require any repair.  Will give short dose of augmentin for prophylaxis.      Candyce Churn, MD 12/31/12 1415

## 2013-03-15 ENCOUNTER — Emergency Department (HOSPITAL_BASED_OUTPATIENT_CLINIC_OR_DEPARTMENT_OTHER)
Admission: EM | Admit: 2013-03-15 | Discharge: 2013-03-15 | Disposition: A | Discharge status: Home / Self Care | Payer: Medicaid Other | Attending: Emergency Medicine | Admitting: Emergency Medicine

## 2013-03-15 ENCOUNTER — Emergency Department (HOSPITAL_BASED_OUTPATIENT_CLINIC_OR_DEPARTMENT_OTHER): Payer: Medicaid Other

## 2013-03-15 ENCOUNTER — Encounter (HOSPITAL_BASED_OUTPATIENT_CLINIC_OR_DEPARTMENT_OTHER): Payer: Self-pay | Admitting: Emergency Medicine

## 2013-03-15 DIAGNOSIS — F411 Generalized anxiety disorder: Secondary | ICD-10-CM | POA: Insufficient documentation

## 2013-03-15 DIAGNOSIS — Z8739 Personal history of other diseases of the musculoskeletal system and connective tissue: Secondary | ICD-10-CM | POA: Insufficient documentation

## 2013-03-15 DIAGNOSIS — F3130 Bipolar disorder, current episode depressed, mild or moderate severity, unspecified: Secondary | ICD-10-CM | POA: Insufficient documentation

## 2013-03-15 DIAGNOSIS — J40 Bronchitis, not specified as acute or chronic: Secondary | ICD-10-CM

## 2013-03-15 DIAGNOSIS — J438 Other emphysema: Secondary | ICD-10-CM | POA: Insufficient documentation

## 2013-03-15 DIAGNOSIS — F17200 Nicotine dependence, unspecified, uncomplicated: Secondary | ICD-10-CM | POA: Insufficient documentation

## 2013-03-15 DIAGNOSIS — J45901 Unspecified asthma with (acute) exacerbation: Secondary | ICD-10-CM | POA: Insufficient documentation

## 2013-03-15 DIAGNOSIS — Z79899 Other long term (current) drug therapy: Secondary | ICD-10-CM | POA: Insufficient documentation

## 2013-03-15 HISTORY — DX: Unspecified osteoarthritis, unspecified site: M19.90

## 2013-03-15 HISTORY — DX: Bipolar disorder, unspecified: F31.9

## 2013-03-15 MED ORDER — PREDNISONE 20 MG PO TABS: 40.0000 mg | ORAL_TABLET | Freq: Every day | ORAL | Status: DC

## 2013-03-15 MED ORDER — AZITHROMYCIN 250 MG PO TABS: 250.0000 mg | ORAL_TABLET | Freq: Every day | ORAL | Status: DC

## 2013-03-15 MED ORDER — HYDROCODONE-HOMATROPINE 5-1.5 MG/5ML PO SYRP: 5.0000 mL | ORAL_SOLUTION | Freq: Four times a day (QID) | ORAL | Status: DC | PRN

## 2013-03-15 NOTE — Discharge Instructions (Signed)
Bronchitis °Bronchitis is swelling (inflammation) of the air tubes leading to your lungs (bronchi). This causes mucus and a cough. If the swelling gets bad, you may have trouble breathing. °HOME CARE  °· Rest. °· Drink enough fluids to keep your pee (urine) clear or pale yellow (unless you have a condition where you have to watch how much you drink). °· Only take medicine as told by your doctor. If you were given antibiotic medicines, finish them even if you start to feel better. °· Avoid smoke, irritating chemicals, and strong smells. These make the problem worse. Quit smoking if you smoke. This helps your lungs heal faster. °· Use a cool mist humidifier. Change the water in the humidifier every day. You can also sit in the bathroom with hot shower running for 5 10 minutes. Keep the door closed. °· See your health care provider as told. °· Wash your hands often. °GET HELP IF: °Your problems do not get better after 1 week. °GET HELP RIGHT AWAY IF:  °· Your fever gets worse. °· You have chills. °· Your chest hurts. °· Your problems breathing get worse. °· You have blood in your mucus. °· You pass out (faint). °· You feel lightheaded. °· You have a bad headache. °· You throw up (vomit) again and again. °MAKE SURE YOU: °· Understand these instructions. °· Will watch your condition. °· Will get help right away if you are not doing well or get worse. °Document Released: 06/29/2007 Document Revised: 10/31/2012 Document Reviewed: 09/04/2012 °[employer]® Patient Information ©2014 [employer]. ° °

## 2013-03-15 NOTE — ED Provider Notes (Addendum)
CSN: [id number]     Arrival date & time 03/15/13  1054 History   First MD Initiated Contact with Patient 03/15/13 1118     Chief Complaint  Patient presents with  . Cough  . Hemoptysis  . Nasal Congestion  . Fever  . Emesis     (Consider location/radiation/quality/duration/timing/severity/associated sxs/prior Treatment) Patient is a 33 y.o. female presenting with cough, fever, and vomiting. The history is provided by the patient.  Cough Cough characteristics:  Productive Sputum characteristics:  Yellow, green and bloody Severity:  Moderate Onset quality:  Gradual Duration:  6 days Timing:  Constant Progression:  Worsening Chronicity:  New Smoker: yes   Context: upper respiratory infection   Relieved by:  Nothing Worsened by:  Lying down and activity Ineffective treatments:  Beta-agonist inhaler and cough suppressants Associated symptoms: chills, fever, myalgias, rhinorrhea, shortness of breath, sinus congestion and wheezing   Wheezing:    Severity:  Mild   Onset quality:  Gradual   Timing:  Intermittent   Progression:  Resolved Risk factors: no recent infection and no recent travel   Risk factors comment:  All 3 of her children with recent illness that is similar Fever Associated symptoms: chills, cough, myalgias, rhinorrhea and vomiting   Emesis Associated symptoms: chills and myalgias     Past Medical History  Diagnosis Date  . COPD (chronic obstructive pulmonary disease)   . Asthma   . Anxiety   . Emphysema   . Depression   . Bipolar 1 disorder   . Arthritis    Past Surgical History  Procedure Laterality Date  . Cholecystectomy     No family history on file. History  Substance Use Topics  . Smoking status: Current Every Day Smoker -- 0.50 packs/day    Types: Cigarettes    Last Attempt to Quit: 05/07/2011  . Smokeless tobacco: Not on file  . Alcohol Use: No   OB History   Grav Para Term Preterm Abortions TAB SAB Ect Mult Living                  Review of Systems  Constitutional: Positive for fever and chills.  HENT: Positive for rhinorrhea.   Respiratory: Positive for cough, shortness of breath and wheezing.   Gastrointestinal: Positive for vomiting.       Post-tussive emesis only  Musculoskeletal: Positive for myalgias.  All other systems reviewed and are negative.      Allergies  Review of patient's allergies indicates no known allergies.  Home Medications   Current Outpatient Rx  Name  Route  Sig  Dispense  Refill  . albuterol (PROVENTIL HFA;VENTOLIN HFA) 108 (90 BASE) MCG/ACT inhaler   Inhalation   Inhale 2 puffs into the lungs every 6 (six) hours as needed. For shortness of breath.          Marland Kitchen amoxicillin-clavulanate (AUGMENTIN) 875-125 MG per tablet   Oral   Take 1 tablet by mouth every 12 (twelve) hours.   6 tablet   0   . EXPIRED: clonazePAM (KLONOPIN) 0.5 MG tablet   Oral   Take 2 tablets (1 mg total) by mouth 2 (two) times daily as needed for anxiety (take 1mg  every am and .5mg  every pm ).   15 tablet   0   . clonazePAM (KLONOPIN) 1 MG tablet   Oral   Take 1 mg by mouth 2 (two) times daily as needed. For anxiety.         Marland Kitchen escitalopram (LEXAPRO) 20 MG  tablet   Oral   Take 20 mg by mouth daily.           Marland Kitchen gabapentin (NEURONTIN) 300 MG capsule   Oral   Take 300 mg by mouth 3 (three) times daily.           . Lurasidone HCl (LATUDA PO)   Oral   Take by mouth.         . Propranolol HCl (INDERAL PO)   Oral   Take by mouth daily.         . TRAZODONE HCL PO   Oral   Take 1 tablet by mouth at bedtime.            BP 124/85  Pulse 108  Temp(Src) 98.7 F (37.1 C) (Oral)  Resp 20  Ht 5\' 7"  (1.702 m)  Wt 250 lb (113.399 kg)  BMI 39.15 kg/m2  SpO2 100%  LMP 02/24/2013 Physical Exam  Nursing note and vitals reviewed. Constitutional: She is oriented to person, place, and time. She appears well-developed and well-nourished. No distress.  HENT:  Head: Normocephalic and  atraumatic.  Right Ear: Tympanic membrane and ear canal normal.  Left Ear: Tympanic membrane and ear canal normal.  Nose: Mucosal edema and rhinorrhea present.  Mouth/Throat: Oropharynx is clear and moist.  Eyes: Conjunctivae and EOM are normal. Pupils are equal, round, and reactive to light.  Neck: Normal range of motion. Neck supple.  Cardiovascular: Normal rate, regular rhythm and intact distal pulses.   No murmur heard. Pulmonary/Chest: Effort normal and breath sounds normal. No respiratory distress. She has no wheezes. She has no rales.  Abdominal: Soft. She exhibits no distension. There is no tenderness. There is no rebound and no guarding.  Musculoskeletal: Normal range of motion. She exhibits no edema and no tenderness.  Neurological: She is alert and oriented to person, place, and time.  Skin: Skin is warm and dry. No rash noted. No erythema.  Psychiatric: She has a normal mood and affect. Her behavior is normal.    ED Course  Procedures (including critical care time) Labs Review Labs Reviewed - No data to display Imaging Review Dg Chest 2 View  03/15/2013   CLINICAL DATA:  Cough, hemoptysis, fever and congestion  EXAM: CHEST  2 VIEW  COMPARISON:  CT angio chest of 05/06/2012 and chest x-ray of the same day  FINDINGS: No active infiltrate or effusion is seen. Mediastinal contours are stable, and heart size is stable. No acute bony abnormality seen, with slight thoracolumbar scoliosis again noted.  IMPRESSION: Stable chest x-ray.  No active lung disease.   Electronically Signed   By: Dwyane Dee M.D.   On: 03/15/2013 11:23    EKG Interpretation   None       MDM   Final diagnoses:  Bronchitis   Pt with symptoms consistent with viral URI and bronchitis.  Well appearing here.  No signs of breathing difficulty or wheezing but pt does have hx of COPD and has reported intermittent wheezing at home.  No signs of pharyngitis, otitis or abnormal abdominal findings.   CXR wnl and  pt to return with any further problems.  Will cover with azithro and prednisone given hx of COPD.  Hemoptysis felt to be from bronchitis and infection.  No prior hx of PE or cancer and pt had CTA of chest <1 year ago for similar sx and it was neg.  No risk factors for PE.     Gwyneth Sprout, MD 03/15/13  1134  Gwyneth SproutWhitney Jelesa Mangini, MD 03/15/13 1135

## 2013-03-15 NOTE — ED Notes (Signed)
Pt reports coughing up blood, congestion, SHOB, and fever that worsens at night since 03/09/2013.

## 2013-03-15 NOTE — ED Notes (Signed)
RRT informed and at bedside

## 2013-04-08 ENCOUNTER — Encounter: Payer: Self-pay | Admitting: Medical

## 2013-04-08 ENCOUNTER — Other Ambulatory Visit (HOSPITAL_COMMUNITY)
Admission: RE | Admit: 2013-04-08 | Discharge: 2013-04-08 | Disposition: A | Discharge status: Home / Self Care | Payer: Medicaid Other | Source: Ambulatory Visit | Attending: Medical | Admitting: Medical

## 2013-04-08 ENCOUNTER — Ambulatory Visit (INDEPENDENT_AMBULATORY_CARE_PROVIDER_SITE_OTHER): Payer: Medicaid Other | Admitting: Medical

## 2013-04-08 VITALS — BP 137/96 | HR 118 | Temp 97.8°F | Ht 67.0 in | Wt 297.1 lb

## 2013-04-08 DIAGNOSIS — Z01419 Encounter for gynecological examination (general) (routine) without abnormal findings: Secondary | ICD-10-CM | POA: Insufficient documentation

## 2013-04-08 DIAGNOSIS — Z1151 Encounter for screening for human papillomavirus (HPV): Secondary | ICD-10-CM | POA: Insufficient documentation

## 2013-04-08 DIAGNOSIS — Z113 Encounter for screening for infections with a predominantly sexual mode of transmission: Secondary | ICD-10-CM | POA: Insufficient documentation

## 2013-04-08 LAB — CBC
HCT: 39 % (ref 36.0–46.0)
HEMOGLOBIN: 13.6 g/dL (ref 12.0–15.0)
MCH: 28.7 pg (ref 26.0–34.0)
MCHC: 34.9 g/dL (ref 30.0–36.0)
MCV: 82.3 fL (ref 78.0–100.0)
Platelets: 389 10*3/uL (ref 150–400)
RBC: 4.74 MIL/uL (ref 3.87–5.11)
RDW: 14.7 % (ref 11.5–15.5)
WBC: 20.8 10*3/uL — ABNORMAL HIGH (ref 4.0–10.5)

## 2013-04-08 NOTE — Patient Instructions (Signed)

## 2013-04-08 NOTE — Progress Notes (Signed)
Subjective:    [name] is a 33 y.o. female who presents for an annual exam. The patient complains of heavy periods. Periods are regular and last ~ 8 days. The patient is sexually active. GYN screening history: last pap: approximate date 2009 and was normal. 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 She has had normal pap results x 3 since then with last pap in 2009. The patient wears seatbelts: yes. The patient participates in regular exercise: not asked. Has the patient ever been transfused or tattooed?: not asked. The patient reports that there is not domestic violence in her life.   Menstrual History: OB History   Grav Para Term Preterm Abortions TAB SAB Ect Mult Living   6 3 3  0 3 0 3 0 0 3      Patient's last menstrual period was 03/27/2013.    The following portions of the patient's history were reviewed and updated as appropriate: allergies, current medications, past family history, past medical history, past social history, past surgical history and problem list.  Review of Systems Pertinent items are noted in HPI.    Objective:     BP 137/96  Pulse 118  Temp(Src) 97.8 F (36.6 C) (Oral)  Ht 5\' 7"  (1.702 m)  Wt 297 lb 1.6 oz (134.764 kg)  BMI 46.52 kg/m2  LMP 03/27/2013 GENERAL: Well-developed, well-nourished female in no acute distress.  HEENT: Normocephalic, atraumatic. Sclerae anicteric.  LUNGS: Clear to auscultation bilaterally.  HEART: Regular rate and rhythm. BREASTS: Symmetric in size. No masses, skin changes, nipple drainage, or lymphadenopathy. ABDOMEN: Soft, nontender, nondistended. No organomegaly. Normal bowel sounds in all 4 quadrants PELVIC: Normal external female genitalia. Vagina is pink and rugated.  Normal discharge. Normal cervix contour. Pap smear and wet prep obtained. Uterus is normal in size. No adnexal mass or tenderness. Exam limited by maternal body habitus EXTREMITIES: No cyanosis, clubbing, or edema  .    Assessment:    Healthy female exam.  Menorrhagia STD testing   Plan:    1. Pap smear, GC/chlamydia, Wet prep, HIV, RPR, Hep B, Hep C and CBC today 2. Patient will be contacted with results. She is concerned that her husband may be unfaithful 3. Discussed Mirena IUD with patient for control of menorrhagia. Educational material given 4. [facility] for [facility] insertion if desired otherwise in 1 year for annual exam or sooner PRN

## 2013-04-09 ENCOUNTER — Other Ambulatory Visit: Payer: Self-pay | Admitting: Medical

## 2013-04-09 ENCOUNTER — Telehealth: Payer: Self-pay | Admitting: *Deleted

## 2013-04-09 DIAGNOSIS — A599 Trichomoniasis, unspecified: Secondary | ICD-10-CM

## 2013-04-09 LAB — HEPATITIS C ANTIBODY: HCV Ab: NEGATIVE

## 2013-04-09 LAB — WET PREP, GENITAL
CLUE CELLS WET PREP: NONE SEEN
WBC WET PREP: NONE SEEN
YEAST WET PREP: NONE SEEN

## 2013-04-09 LAB — HEPATITIS B SURFACE ANTIGEN: Hepatitis B Surface Ag: NEGATIVE

## 2013-04-09 LAB — RPR

## 2013-04-09 LAB — HIV ANTIBODY (ROUTINE TESTING W REFLEX): HIV: NONREACTIVE

## 2013-04-09 MED ORDER — METRONIDAZOLE 500 MG PO TABS: 2000.0000 mg | ORAL_TABLET | Freq: Once | ORAL | Status: DC

## 2013-04-09 NOTE — Telephone Encounter (Addendum)
Message copied by Jill SideAY, DIANE L on Tue Apr 09, 2013  9:44 AM ------      Message from: Freddi StarrETHIER, JULIE N      Created: Tue Apr 09, 2013  8:48 AM       Patient has Trichomonas. I will send Rx for Flagyl to her pharmacy. Please call patient and inform her of the diagnosis and Rx. Advise partner treatment.             Thanks!      Raynelle FanningJulie  ------ Called pt and informed her of trichomonas infection. She may pick up her prescription today. Her partner will require treatment also- he may receive from his doctor or the HD.  She should not have sex for 1 week after her treatment and for 1 week after her partner's treatment. Pt voiced understanding.

## 2013-04-17 ENCOUNTER — Telehealth: Payer: Self-pay | Admitting: *Deleted

## 2013-04-17 NOTE — Telephone Encounter (Addendum)
Pt left message requesting results.  I returned her call and left a message on her personal voice mail of all test results (Pap, GC/Chlam, HIV, Hep B, Hep C, RPR) from her recent visit on 3/16.  Pt was already aware of +trich.

## 2013-05-13 ENCOUNTER — Emergency Department (HOSPITAL_BASED_OUTPATIENT_CLINIC_OR_DEPARTMENT_OTHER): Payer: Medicaid Other

## 2013-05-13 ENCOUNTER — Encounter (HOSPITAL_BASED_OUTPATIENT_CLINIC_OR_DEPARTMENT_OTHER): Payer: Self-pay | Admitting: Emergency Medicine

## 2013-05-13 ENCOUNTER — Emergency Department (HOSPITAL_BASED_OUTPATIENT_CLINIC_OR_DEPARTMENT_OTHER)
Admission: EM | Admit: 2013-05-13 | Discharge: 2013-05-13 | Disposition: A | Discharge status: Home / Self Care | Payer: Medicaid Other | Attending: Emergency Medicine | Admitting: Emergency Medicine

## 2013-05-13 DIAGNOSIS — F319 Bipolar disorder, unspecified: Secondary | ICD-10-CM | POA: Insufficient documentation

## 2013-05-13 DIAGNOSIS — Z87891 Personal history of nicotine dependence: Secondary | ICD-10-CM | POA: Insufficient documentation

## 2013-05-13 DIAGNOSIS — J449 Chronic obstructive pulmonary disease, unspecified: Secondary | ICD-10-CM | POA: Insufficient documentation

## 2013-05-13 DIAGNOSIS — Y9389 Activity, other specified: Secondary | ICD-10-CM | POA: Insufficient documentation

## 2013-05-13 DIAGNOSIS — Z792 Long term (current) use of antibiotics: Secondary | ICD-10-CM | POA: Insufficient documentation

## 2013-05-13 DIAGNOSIS — Y9289 Other specified places as the place of occurrence of the external cause: Secondary | ICD-10-CM | POA: Insufficient documentation

## 2013-05-13 DIAGNOSIS — F411 Generalized anxiety disorder: Secondary | ICD-10-CM | POA: Insufficient documentation

## 2013-05-13 DIAGNOSIS — J4489 Other specified chronic obstructive pulmonary disease: Secondary | ICD-10-CM | POA: Insufficient documentation

## 2013-05-13 DIAGNOSIS — Z8739 Personal history of other diseases of the musculoskeletal system and connective tissue: Secondary | ICD-10-CM | POA: Insufficient documentation

## 2013-05-13 DIAGNOSIS — S0993XA Unspecified injury of face, initial encounter: Secondary | ICD-10-CM | POA: Insufficient documentation

## 2013-05-13 DIAGNOSIS — IMO0002 Reserved for concepts with insufficient information to code with codable children: Secondary | ICD-10-CM | POA: Insufficient documentation

## 2013-05-13 DIAGNOSIS — Z79899 Other long term (current) drug therapy: Secondary | ICD-10-CM | POA: Insufficient documentation

## 2013-05-13 DIAGNOSIS — S199XXA Unspecified injury of neck, initial encounter: Principal | ICD-10-CM

## 2013-05-13 MED ORDER — IBUPROFEN 800 MG PO TABS
800.0000 mg | ORAL_TABLET | Freq: Once | ORAL | Status: AC
  Administered 2013-05-13: 800 mg via ORAL
  Filled 2013-05-13: qty 1
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
## 2013-05-13 NOTE — ED Notes (Signed)
Patient transported to X-ray 

## 2013-05-13 NOTE — ED Provider Notes (Signed)
Medical screening examination/treatment/procedure(s) were performed by non-physician practitioner and as supervising physician I was immediately available for consultation/collaboration.   EKG Interpretation None        Courtney F Horton, MD 05/13/13 1708 
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
## 2013-05-31 ENCOUNTER — Emergency Department (HOSPITAL_COMMUNITY): Payer: Medicaid Other

## 2013-05-31 ENCOUNTER — Emergency Department (HOSPITAL_COMMUNITY)
Admission: EM | Admit: 2013-05-31 | Discharge: 2013-05-31 | Disposition: A | Discharge status: Home / Self Care | Payer: Medicaid Other | Attending: Emergency Medicine | Admitting: Emergency Medicine

## 2013-05-31 ENCOUNTER — Encounter (HOSPITAL_COMMUNITY): Payer: Self-pay | Admitting: Emergency Medicine

## 2013-05-31 DIAGNOSIS — R071 Chest pain on breathing: Secondary | ICD-10-CM | POA: Insufficient documentation

## 2013-05-31 DIAGNOSIS — R0789 Other chest pain: Secondary | ICD-10-CM

## 2013-05-31 DIAGNOSIS — J45901 Unspecified asthma with (acute) exacerbation: Secondary | ICD-10-CM

## 2013-05-31 DIAGNOSIS — Z3202 Encounter for pregnancy test, result negative: Secondary | ICD-10-CM | POA: Insufficient documentation

## 2013-05-31 DIAGNOSIS — Z79899 Other long term (current) drug therapy: Secondary | ICD-10-CM | POA: Insufficient documentation

## 2013-05-31 DIAGNOSIS — G8929 Other chronic pain: Secondary | ICD-10-CM | POA: Insufficient documentation

## 2013-05-31 DIAGNOSIS — F411 Generalized anxiety disorder: Secondary | ICD-10-CM | POA: Insufficient documentation

## 2013-05-31 DIAGNOSIS — F319 Bipolar disorder, unspecified: Secondary | ICD-10-CM | POA: Insufficient documentation

## 2013-05-31 DIAGNOSIS — J441 Chronic obstructive pulmonary disease with (acute) exacerbation: Secondary | ICD-10-CM | POA: Diagnosis not present

## 2013-05-31 DIAGNOSIS — Z87891 Personal history of nicotine dependence: Secondary | ICD-10-CM | POA: Diagnosis not present

## 2013-05-31 DIAGNOSIS — R109 Unspecified abdominal pain: Secondary | ICD-10-CM | POA: Diagnosis present

## 2013-05-31 DIAGNOSIS — Z8739 Personal history of other diseases of the musculoskeletal system and connective tissue: Secondary | ICD-10-CM | POA: Insufficient documentation

## 2013-05-31 DIAGNOSIS — IMO0002 Reserved for concepts with insufficient information to code with codable children: Secondary | ICD-10-CM | POA: Insufficient documentation

## 2013-05-31 LAB — URINALYSIS, ROUTINE W REFLEX MICROSCOPIC
Bilirubin Urine: NEGATIVE
GLUCOSE, UA: NEGATIVE mg/dL
Hgb urine dipstick: NEGATIVE
KETONES UR: NEGATIVE mg/dL
LEUKOCYTES UA: NEGATIVE
Nitrite: NEGATIVE
Protein, ur: NEGATIVE mg/dL
Specific Gravity, Urine: 1.024 (ref 1.005–1.030)
UROBILINOGEN UA: 0.2 mg/dL (ref 0.0–1.0)
pH: 5 (ref 5.0–8.0)

## 2013-05-31 LAB — PREGNANCY, URINE: PREG TEST UR: NEGATIVE

## 2013-05-31 MED ORDER — HYDROCODONE-ACETAMINOPHEN 5-325 MG PO TABS: 1.0000 | ORAL_TABLET | Freq: Four times a day (QID) | ORAL | Status: DC | PRN

## 2013-05-31 MED ORDER — KETOROLAC TROMETHAMINE 60 MG/2ML IM SOLN
60.0000 mg | Freq: Once | INTRAMUSCULAR | Status: AC
  Administered 2013-05-31: 60 mg via INTRAMUSCULAR
  Filled 2013-05-31: qty 2

## 2013-05-31 MED ORDER — IBUPROFEN 800 MG PO TABS: 800.0000 mg | ORAL_TABLET | Freq: Three times a day (TID) | ORAL | Status: DC

## 2013-05-31 NOTE — Discharge Instructions (Signed)

## 2013-05-31 NOTE — ED Provider Notes (Signed)
CSN: [id number]     Arrival date & time 05/31/13  1921 History   First MD Initiated Contact with Patient 05/31/13 1953     Chief Complaint  Patient presents with  . Abdominal Pain  . Flank Pain     (Consider location/radiation/quality/duration/timing/severity/associated sxs/prior Treatment) Patient is a 33 y.o. female presenting with chest pain. The history is provided by the patient.  Chest Pain Pain location:  L lateral chest Pain quality: sharp   Pain radiates to:  Does not radiate Pain radiates to the back: no   Pain severity:  Moderate Onset quality:  Sudden Duration:  3 days Timing:  Constant Progression:  Unchanged Chronicity:  New Context: at rest   Relieved by:  Nothing Worsened by:  Certain positions, deep breathing, coughing and movement (L arm movements) Associated symptoms: cough (chronic, no changes) and shortness of breath (due to pain)   Associated symptoms: no fever     Past Medical History  Diagnosis Date  . COPD (chronic obstructive pulmonary disease)   . Asthma   . Anxiety   . Emphysema   . Depression   . Bipolar 1 disorder   . Arthritis    Past Surgical History  Procedure Laterality Date  . Cholecystectomy     History reviewed. No pertinent family history. History  Substance Use Topics  . Smoking status: Former Smoker -- 0.50 packs/day    Types: Cigarettes    Quit date: 05/07/2011  . Smokeless tobacco: Not on file  . Alcohol Use: No   OB History   Grav Para Term Preterm Abortions TAB SAB Ect Mult Living   6 3 3  0 3 0 3 0 0 3     Review of Systems  Constitutional: Negative for fever and chills.  Respiratory: Positive for cough (chronic, no changes) and shortness of breath (due to pain).   Cardiovascular: Positive for chest pain. Negative for leg swelling.  All other systems reviewed and are negative.     Allergies  Review of patient's allergies indicates no known allergies.  Home Medications   Prior to Admission medications    Medication Sig Start Date End Date Taking? Authorizing Provider  albuterol (PROVENTIL HFA;VENTOLIN HFA) 108 (90 BASE) MCG/ACT inhaler Inhale 2 puffs into the lungs every 6 (six) hours as needed. For shortness of breath.    Yes Historical Provider, MD  beclomethasone (QVAR) 80 MCG/ACT inhaler Inhale 2 puffs into the lungs 2 (two) times daily.   Yes Historical Provider, MD  clonazePAM (KLONOPIN) 1 MG tablet Take 1 mg by mouth 2 (two) times daily as needed. For anxiety.   Yes Historical Provider, MD  escitalopram (LEXAPRO) 20 MG tablet Take 20 mg by mouth daily.     Yes Historical Provider, MD  gabapentin (NEURONTIN) 300 MG capsule Take 300 mg by mouth 3 (three) times daily.     Yes Historical Provider, MD  lurasidone (LATUDA) 80 MG TABS tablet Take 80 mg by mouth daily with breakfast.   Yes Historical Provider, MD  traZODone (DESYREL) 150 MG tablet Take 150 mg by mouth at bedtime.   Yes Historical Provider, MD  clonazePAM (KLONOPIN) 0.5 MG tablet Take 2 tablets (1 mg total) by mouth 2 (two) times daily as needed for anxiety (take 1mg  every am and .5mg  every pm ). 01/29/11 05/06/12  Remi HaggardAnne Crawford, NP   BP 113/68  Pulse 103  Temp(Src) 99 F (37.2 C) (Oral)  Resp 20  SpO2 98%  LMP 04/24/2013 Physical Exam  Nursing note  and vitals reviewed. Constitutional: She is oriented to person, place, and time. She appears well-developed and well-nourished. No distress.  HENT:  Head: Normocephalic and atraumatic.  Mouth/Throat: Oropharynx is clear and moist.  Eyes: EOM are normal. Pupils are equal, round, and reactive to light.  Neck: Normal range of motion. Neck supple.  Cardiovascular: Normal rate and regular rhythm.  Exam reveals no friction rub.   No murmur heard. Pulmonary/Chest: Effort normal and breath sounds normal. No respiratory distress. She has no wheezes. She has no rales. She exhibits tenderness (L posterior chest wall, L lateral chest wall - around 4-6 ribs).  Abdominal: Soft. She exhibits no  distension. There is no tenderness. There is no rebound.  Musculoskeletal: Normal range of motion. She exhibits no edema.  Neurological: She is alert and oriented to person, place, and time.  Skin: No rash noted. She is not diaphoretic.    ED Course  Procedures (including critical care time) Labs Review Labs Reviewed  URINALYSIS, ROUTINE W REFLEX MICROSCOPIC - Abnormal; Notable for the following:    APPearance CLOUDY (*)    All other components within normal limits  PREGNANCY, URINE    Imaging Review No results found.   EKG Interpretation None      MDM   Final diagnoses:  Left-sided chest wall pain    83F presents with chest pain. L sided, sharp, hurts with movement, deep breathing, constant. Hurts with L arm movement. Cannot raise L arm overhead due to chest wall pain. No fevers, cough, N/V/D.  Patient has chest wall tenderness to palpation. No abdominal pain, no flank pain. Exam and history c/w musculoskeletal chest wall pain. Will start with CXR, toradol shot. Patient's clinical exam is not c/w PE. Mild relief with toradol. CXR negative. Given vicodin, NSAIDs, instructed to f/u with PCP.  Dagmar HaitWilliam Ellizabeth Dacruz, MD 05/31/13 [phone]

## 2013-05-31 NOTE — ED Notes (Signed)
Pt arrived with a complaint of abdominal pain.  Pt has had a tubal ligation but has not had a period since March.  Pt also states she has left sided flank pain.  Pt denies and pain radiation.  Pt states symptoms have been present for 3 days

## 2013-06-05 ENCOUNTER — Emergency Department (HOSPITAL_BASED_OUTPATIENT_CLINIC_OR_DEPARTMENT_OTHER)
Admission: EM | Admit: 2013-06-05 | Discharge: 2013-06-05 | Disposition: A | Discharge status: Home / Self Care | Payer: Medicaid Other | Attending: Emergency Medicine | Admitting: Emergency Medicine

## 2013-06-05 ENCOUNTER — Emergency Department (HOSPITAL_BASED_OUTPATIENT_CLINIC_OR_DEPARTMENT_OTHER): Payer: Medicaid Other

## 2013-06-05 ENCOUNTER — Encounter (HOSPITAL_BASED_OUTPATIENT_CLINIC_OR_DEPARTMENT_OTHER): Payer: Self-pay | Admitting: Emergency Medicine

## 2013-06-05 DIAGNOSIS — J449 Chronic obstructive pulmonary disease, unspecified: Secondary | ICD-10-CM | POA: Insufficient documentation

## 2013-06-05 DIAGNOSIS — Z79899 Other long term (current) drug therapy: Secondary | ICD-10-CM | POA: Insufficient documentation

## 2013-06-05 DIAGNOSIS — M129 Arthropathy, unspecified: Secondary | ICD-10-CM | POA: Insufficient documentation

## 2013-06-05 DIAGNOSIS — R0789 Other chest pain: Secondary | ICD-10-CM

## 2013-06-05 DIAGNOSIS — F411 Generalized anxiety disorder: Secondary | ICD-10-CM | POA: Insufficient documentation

## 2013-06-05 DIAGNOSIS — J4489 Other specified chronic obstructive pulmonary disease: Secondary | ICD-10-CM | POA: Insufficient documentation

## 2013-06-05 DIAGNOSIS — IMO0002 Reserved for concepts with insufficient information to code with codable children: Secondary | ICD-10-CM | POA: Insufficient documentation

## 2013-06-05 DIAGNOSIS — Z87891 Personal history of nicotine dependence: Secondary | ICD-10-CM | POA: Insufficient documentation

## 2013-06-05 DIAGNOSIS — F319 Bipolar disorder, unspecified: Secondary | ICD-10-CM | POA: Insufficient documentation

## 2013-06-05 DIAGNOSIS — R071 Chest pain on breathing: Secondary | ICD-10-CM | POA: Insufficient documentation

## 2013-06-05 DIAGNOSIS — Z791 Long term (current) use of non-steroidal anti-inflammatories (NSAID): Secondary | ICD-10-CM | POA: Insufficient documentation

## 2013-06-05 MED ORDER — KETOROLAC TROMETHAMINE 60 MG/2ML IM SOLN
60.0000 mg | Freq: Once | INTRAMUSCULAR | Status: AC
  Administered 2013-06-05: 60 mg via INTRAMUSCULAR
  Filled 2013-06-05: qty 2

## 2013-06-05 MED ORDER — OXYCODONE-ACETAMINOPHEN 5-325 MG PO TABS: 1.0000 | ORAL_TABLET | ORAL | Status: DC | PRN

## 2013-06-05 NOTE — Discharge Instructions (Signed)
Ibuprofen 600 mg 3 times daily for the next 5 days. Percocet as needed for pain not relieved with ibuprofen.  Followup with your primary Dr. if not improving in the next week.  You should also discuss smoking cessation with your primary physician.   Chest Wall Pain Chest wall pain is pain in or around the bones and muscles of your chest. It may take up to 6 weeks to get better. It may take longer if you must stay physically active in your work and activities.  CAUSES  Chest wall pain may happen on its own. However, it may be caused by:  A viral illness like the flu.  Injury.  Coughing.  Exercise.  Arthritis.  Fibromyalgia.  Shingles. HOME CARE INSTRUCTIONS   Avoid overtiring physical activity. Try not to strain or perform activities that cause pain. This includes any activities using your chest or your abdominal and side muscles, especially if heavy weights are used.  Put ice on the sore area.  Put ice in a plastic bag.  Place a towel between your skin and the bag.  Leave the ice on for 15-20 minutes per hour while awake for the first 2 days.  Only take over-the-counter or prescription medicines for pain, discomfort, or fever as directed by your caregiver. SEEK IMMEDIATE MEDICAL CARE IF:   Your pain increases, or you are very uncomfortable.  You have a fever.  Your chest pain becomes worse.  You have new, unexplained symptoms.  You have nausea or vomiting.  You feel sweaty or lightheaded.  You have a cough with phlegm (sputum), or you cough up blood. MAKE SURE YOU:   Understand these instructions.  Will watch your condition.  Will get help right away if you are not doing well or get worse. Document Released: 01/10/2005 Document Revised: 04/04/2011 Document Reviewed: 09/06/2010 [facility] Patient Information 2014 [place], [place].

## 2013-06-05 NOTE — ED Notes (Signed)
Left rib pain that developed Saturday after coughing.  [facility]  Pain worse this am.

## 2013-06-05 NOTE — ED Provider Notes (Signed)
CSN: [id number]     Arrival date & time 06/05/13  0900 History   First MD Initiated Contact with Patient 06/05/13 0915     Chief Complaint  Patient presents with  . Rib Injury     (Consider location/radiation/quality/duration/timing/severity/associated sxs/prior Treatment) HPI Comments: Patient is a 33 year old female with history of COPD who continues to smoke. She presents with complaints of left chest wall pain. She states that she coughed last week and felt a pop in her ribs. [facility] and had a chest x-ray performed last week which was negative. She returns today with complaints of continued pain. She denies any productive cough, fever. She states that her pain is worse with movement and palpation and taking a deep breath. She denies any shortness of breath.  The history is provided by the patient.    Past Medical History  Diagnosis Date  . COPD (chronic obstructive pulmonary disease)   . Asthma   . Anxiety   . Emphysema   . Depression   . Bipolar 1 disorder   . Arthritis    Past Surgical History  Procedure Laterality Date  . Cholecystectomy     No family history on file. History  Substance Use Topics  . Smoking status: Former Smoker -- 0.50 packs/day    Types: Cigarettes    Quit date: 05/07/2011  . Smokeless tobacco: Not on file  . Alcohol Use: No   OB History   Grav Para Term Preterm Abortions TAB SAB Ect Mult Living   6 3 3  0 3 0 3 0 0 3     Review of Systems  All other systems reviewed and are negative.     Allergies  Review of patient's allergies indicates no known allergies.  Home Medications   Prior to Admission medications   Medication Sig Start Date End Date Taking? Authorizing Provider  albuterol (PROVENTIL HFA;VENTOLIN HFA) 108 (90 BASE) MCG/ACT inhaler Inhale 2 puffs into the lungs every 6 (six) hours as needed. For shortness of breath.     Historical Provider, MD  beclomethasone (QVAR) 80 MCG/ACT inhaler Inhale 2 puffs into  the lungs 2 (two) times daily.    Historical Provider, MD  clonazePAM (KLONOPIN) 0.5 MG tablet Take 2 tablets (1 mg total) by mouth 2 (two) times daily as needed for anxiety (take 1mg  every am and .5mg  every pm ). 01/29/11 05/06/12  Remi HaggardAnne Crawford, NP  clonazePAM (KLONOPIN) 1 MG tablet Take 1 mg by mouth 2 (two) times daily as needed. For anxiety.    Historical Provider, MD  escitalopram (LEXAPRO) 20 MG tablet Take 20 mg by mouth daily.      Historical Provider, MD  gabapentin (NEURONTIN) 300 MG capsule Take 300 mg by mouth 3 (three) times daily.      Historical Provider, MD  HYDROcodone-acetaminophen (NORCO/VICODIN) 5-325 MG per tablet Take 1 tablet by mouth every 6 (six) hours as needed for moderate pain. 05/31/13   Dagmar HaitWilliam Blair Walden, MD  ibuprofen (ADVIL,MOTRIN) 800 MG tablet Take 1 tablet (800 mg total) by mouth 3 (three) times daily. 05/31/13   Dagmar HaitWilliam Blair Walden, MD  lurasidone (LATUDA) 80 MG TABS tablet Take 80 mg by mouth daily with breakfast.    Historical Provider, MD  traZODone (DESYREL) 150 MG tablet Take 150 mg by mouth at bedtime.    Historical Provider, MD   BP 152/105  Pulse 114  Temp(Src) 98.7 F (37.1 C) (Oral)  Resp 28  Ht 5\' 7"  (1.702 m)  Wt 250 lb (113.399 kg)  BMI 39.15 kg/m2  SpO2 100%  LMP 04/22/2013 Physical Exam  Nursing note and vitals reviewed. Constitutional: She is oriented to person, place, and time. She appears well-developed and well-nourished. No distress.  HENT:  Head: Normocephalic and atraumatic.  Neck: Normal range of motion. Neck supple.  Cardiovascular: Normal rate and regular rhythm.  Exam reveals no gallop and no friction rub.   No murmur heard. Pulmonary/Chest: Effort normal and breath sounds normal. No respiratory distress. She has no wheezes. She exhibits tenderness.  There is tenderness to palpation in the left lateral chest wall. There is no crepitus or palpable abnormality.  Abdominal: Soft. Bowel sounds are normal. She exhibits no  distension. There is no tenderness.  Musculoskeletal: Normal range of motion.  Neurological: She is alert and oriented to person, place, and time.  Skin: Skin is warm and dry. She is not diaphoretic.    ED Course  Procedures (including critical care time) Labs Review Labs Reviewed - No data to display  Imaging Review No results found.   EKG Interpretation None      MDM   Final diagnoses:  None    X-rays show no evidence for fracture or pneumothorax. Her history, exam are inconsistent with pulmonary embolism and I do not feel as though further workup in that this is indicated. This appears very musculoskeletal in nature. She states that Percocet works better for her pain and is requesting this. I will prescribe a short quantity of these and she needs to followup with her primary Dr. for refills.    Geoffery Lyonsouglas Anzlee Hinesley, MD 06/05/13 1030

## 2013-06-05 NOTE — ED Notes (Signed)
Pt returned from X-ray.  

## 2013-06-05 NOTE — ED Provider Notes (Signed)
CSN: [id number]     Arrival date & time 06/05/13  0900 History   First MD Initiated Contact with Patient 06/05/13 0915     Chief Complaint  Patient presents with  . Rib Injury     (Consider location/radiation/quality/duration/timing/severity/associated sxs/prior Treatment) HPI  Past Medical History  Diagnosis Date  . COPD (chronic obstructive pulmonary disease)   . Asthma   . Anxiety   . Emphysema   . Depression   . Bipolar 1 disorder   . Arthritis    Past Surgical History  Procedure Laterality Date  . Cholecystectomy     No family history on file. History  Substance Use Topics  . Smoking status: Former Smoker -- 0.50 packs/day    Types: Cigarettes    Quit date: 05/07/2011  . Smokeless tobacco: Not on file  . Alcohol Use: No   OB History   Grav Para Term Preterm Abortions TAB SAB Ect Mult Living   6 3 3  0 3 0 3 0 0 3     Review of Systems    Allergies  Review of patient's allergies indicates no known allergies.  Home Medications   Prior to Admission medications   Medication Sig Start Date End Date Taking? Authorizing Provider  albuterol (PROVENTIL HFA;VENTOLIN HFA) 108 (90 BASE) MCG/ACT inhaler Inhale 2 puffs into the lungs every 6 (six) hours as needed. For shortness of breath.     Historical Provider, MD  beclomethasone (QVAR) 80 MCG/ACT inhaler Inhale 2 puffs into the lungs 2 (two) times daily.    Historical Provider, MD  clonazePAM (KLONOPIN) 0.5 MG tablet Take 2 tablets (1 mg total) by mouth 2 (two) times daily as needed for anxiety (take 1mg  every am and .5mg  every pm ). 01/29/11 05/06/12  Remi HaggardAnne Crawford, NP  clonazePAM (KLONOPIN) 1 MG tablet Take 1 mg by mouth 2 (two) times daily as needed. For anxiety.    Historical Provider, MD  escitalopram (LEXAPRO) 20 MG tablet Take 20 mg by mouth daily.      Historical Provider, MD  gabapentin (NEURONTIN) 300 MG capsule Take 300 mg by mouth 3 (three) times daily.      Historical Provider, MD  HYDROcodone-acetaminophen  (NORCO/VICODIN) 5-325 MG per tablet Take 1 tablet by mouth every 6 (six) hours as needed for moderate pain. 05/31/13   Dagmar HaitWilliam Blair Walden, MD  ibuprofen (ADVIL,MOTRIN) 800 MG tablet Take 1 tablet (800 mg total) by mouth 3 (three) times daily. 05/31/13   Dagmar HaitWilliam Blair Walden, MD  lurasidone (LATUDA) 80 MG TABS tablet Take 80 mg by mouth daily with breakfast.    Historical Provider, MD  traZODone (DESYREL) 150 MG tablet Take 150 mg by mouth at bedtime.    Historical Provider, MD   BP 152/105  Pulse 114  Temp(Src) 98.7 F (37.1 C) (Oral)  Resp 28  Ht 5\' 7"  (1.702 m)  Wt 250 lb (113.399 kg)  BMI 39.15 kg/m2  SpO2 100%  LMP 04/22/2013 Physical Exam  ED Course  Procedures (including critical care time) Labs Review Labs Reviewed - No data to display  Imaging Review No results found.   EKG Interpretation None      MDM   Final diagnoses:  None    Patient is a 33 year old female who presents with complaint of pain in the left ribs after coughing. This occurred last week. [facility] and had a chest x-ray which was unremarkable. She is having no relief with the medications prescribed at  her pain actually worsened this morning. There is tenderness to palpation in the left lateral ribs with no crepitus and equal breath sounds. Chest x-ray does not reveal pneumothorax and x-rays of the ribs do not reveal any obvious fracture or other abnormality. She is given an IM injection of Toradol and will be discharged.  She was given a prescription for percocet as she states over-the-counter medications have offered no relief.    Geoffery Lyonsouglas Nadeen Shipman, MD 06/06/13 [phone]

## 2013-06-05 NOTE — ED Notes (Signed)
Patient transported to X-ray 

## 2013-11-25 ENCOUNTER — Encounter (HOSPITAL_BASED_OUTPATIENT_CLINIC_OR_DEPARTMENT_OTHER): Payer: Self-pay | Admitting: Emergency Medicine

## 2014-05-02 ENCOUNTER — Encounter (HOSPITAL_BASED_OUTPATIENT_CLINIC_OR_DEPARTMENT_OTHER): Payer: Self-pay | Admitting: *Deleted

## 2014-05-02 ENCOUNTER — Emergency Department (HOSPITAL_BASED_OUTPATIENT_CLINIC_OR_DEPARTMENT_OTHER): Payer: Medicare Other

## 2014-05-02 ENCOUNTER — Emergency Department (HOSPITAL_BASED_OUTPATIENT_CLINIC_OR_DEPARTMENT_OTHER)
Admission: EM | Admit: 2014-05-02 | Discharge: 2014-05-02 | Disposition: A | Discharge status: Home / Self Care | Payer: Medicare Other | Attending: Emergency Medicine | Admitting: Emergency Medicine

## 2014-05-02 DIAGNOSIS — S2231XA Fracture of one rib, right side, initial encounter for closed fracture: Secondary | ICD-10-CM | POA: Insufficient documentation

## 2014-05-02 DIAGNOSIS — F319 Bipolar disorder, unspecified: Secondary | ICD-10-CM | POA: Insufficient documentation

## 2014-05-02 DIAGNOSIS — Z87891 Personal history of nicotine dependence: Secondary | ICD-10-CM | POA: Diagnosis not present

## 2014-05-02 DIAGNOSIS — J189 Pneumonia, unspecified organism: Secondary | ICD-10-CM

## 2014-05-02 DIAGNOSIS — Y998 Other external cause status: Secondary | ICD-10-CM | POA: Insufficient documentation

## 2014-05-02 DIAGNOSIS — R000 Tachycardia, unspecified: Secondary | ICD-10-CM | POA: Diagnosis not present

## 2014-05-02 DIAGNOSIS — X58XXXA Exposure to other specified factors, initial encounter: Secondary | ICD-10-CM | POA: Insufficient documentation

## 2014-05-02 DIAGNOSIS — Z79899 Other long term (current) drug therapy: Secondary | ICD-10-CM | POA: Insufficient documentation

## 2014-05-02 DIAGNOSIS — F419 Anxiety disorder, unspecified: Secondary | ICD-10-CM | POA: Insufficient documentation

## 2014-05-02 DIAGNOSIS — J449 Chronic obstructive pulmonary disease, unspecified: Secondary | ICD-10-CM | POA: Insufficient documentation

## 2014-05-02 DIAGNOSIS — M1990 Unspecified osteoarthritis, unspecified site: Secondary | ICD-10-CM | POA: Insufficient documentation

## 2014-05-02 DIAGNOSIS — S299XXA Unspecified injury of thorax, initial encounter: Secondary | ICD-10-CM | POA: Diagnosis present

## 2014-05-02 DIAGNOSIS — J159 Unspecified bacterial pneumonia: Secondary | ICD-10-CM | POA: Insufficient documentation

## 2014-05-02 DIAGNOSIS — Y9389 Activity, other specified: Secondary | ICD-10-CM | POA: Diagnosis not present

## 2014-05-02 DIAGNOSIS — Z7951 Long term (current) use of inhaled steroids: Secondary | ICD-10-CM | POA: Diagnosis not present

## 2014-05-02 DIAGNOSIS — Y9289 Other specified places as the place of occurrence of the external cause: Secondary | ICD-10-CM | POA: Insufficient documentation

## 2014-05-02 MED ORDER — OXYCODONE-ACETAMINOPHEN 5-325 MG PO TABS: 1.0000 | ORAL_TABLET | Freq: Four times a day (QID) | ORAL | Status: DC | PRN

## 2014-05-02 MED ORDER — BENZONATATE 100 MG PO CAPS: 100.0000 mg | ORAL_CAPSULE | Freq: Three times a day (TID) | ORAL | Status: DC

## 2014-05-02 MED ORDER — LEVOFLOXACIN 750 MG PO TABS: 750.0000 mg | ORAL_TABLET | Freq: Every day | ORAL | Status: DC

## 2014-05-02 NOTE — ED Notes (Signed)
Pt sts she has a chronic cough and 4 days ago, she was coughing and felt a pop in her right ribs. Pt sts pain has been constant since and she is coughing up dark mucous.

## 2014-05-02 NOTE — ED Provider Notes (Signed)
CSN: [id number]     Arrival date & time 05/02/14  1418 History   First MD Initiated Contact with Patient 05/02/14 1433     Chief Complaint  Patient presents with  . Chest Pain     (Consider location/radiation/quality/duration/timing/severity/associated sxs/prior Treatment) HPI Comments: 34 year old morbidly obese female complaining of right-sided rib pain 4 days. Reports having a chronic cough "because I have COPD/emphysema", and 4 days ago, felt a "pop" on the right side of her ribs. Pain has remained constant since, worse with movement and coughing. Cough is productive with dark mucus. Denies fever or chills. Denies shortness of breath. No alleviating factors.  Patient is a 34 y.o. female presenting with chest pain. The history is provided by the patient.  Chest Pain Associated symptoms: cough     Past Medical History  Diagnosis Date  . COPD (chronic obstructive pulmonary disease)   . Asthma   . Anxiety   . Emphysema   . Depression   . Bipolar 1 disorder   . Arthritis    Past Surgical History  Procedure Laterality Date  . Cholecystectomy     No family history on file. History  Substance Use Topics  . Smoking status: Former Smoker -- 0.50 packs/day    Types: Cigarettes    Quit date: 05/07/2011  . Smokeless tobacco: Not on file  . Alcohol Use: No   OB History    Gravida Para Term Preterm AB TAB SAB Ectopic Multiple Living   0 3 0 3 0 0 3     Review of Systems  Respiratory: Positive for cough.   Musculoskeletal:       +R sided rib pain.  All other systems reviewed and are negative.     Allergies  Review of patient's allergies indicates no known allergies.  Home Medications   Prior to Admission medications   Medication Sig Start Date End Date Taking? Authorizing Provider  albuterol (PROVENTIL HFA;VENTOLIN HFA) 108 (90 BASE) MCG/ACT inhaler Inhale 2 puffs into the lungs every 6 (six) hours as needed. For shortness of breath.    Yes Historical Provider,  MD  beclomethasone (QVAR) 80 MCG/ACT inhaler Inhale 2 puffs into the lungs 2 (two) times daily.   Yes Historical Provider, MD  clonazePAM (KLONOPIN) 1 MG tablet Take 1 mg by mouth 2 (two) times daily as needed. For anxiety.   Yes Historical Provider, MD  escitalopram (LEXAPRO) 20 MG tablet Take 20 mg by mouth daily.     Yes Historical Provider, MD  gabapentin (NEURONTIN) 300 MG capsule Take 300 mg by mouth 3 (three) times daily.     Yes Historical Provider, MD  lurasidone (LATUDA) 80 MG TABS tablet Take 80 mg by mouth daily with breakfast.   Yes Historical Provider, MD  traZODone (DESYREL) 150 MG tablet Take 150 mg by mouth at bedtime.   Yes Historical Provider, MD  benzonatate (TESSALON) 100 MG capsule Take 1 capsule (100 mg total) by mouth every 8 (eight) hours. 05/02/14   Kathrynn Speed, PA-C  clonazePAM (KLONOPIN) 0.5 MG tablet Take 2 tablets (1 mg total) by mouth 2 (two) times daily as needed for anxiety (take  every am and .  every pm ). 01/29/11 05/06/12  Jethro Bastos, NP  HYDROcodone-acetaminophen (NORCO/VICODIN) 5-325 MG per tablet Take 1 tablet by mouth every 6 (six) hours as needed for moderate pain. 05/31/13   Elwin Mocha, MD  ibuprofen (ADVIL,MOTRIN) 800 MG tablet Take 1 tablet (800 mg total) by mouth 3 (  three) times daily. 05/31/13   Elwin MochaBlair Walden, MD  levofloxacin (LEVAQUIN) 750 MG tablet Take 1 tablet (750 mg total) by mouth daily. X 7 days 05/02/14   Kathrynn Speedobyn M Ia Leeb, PA-C  oxyCODONE-acetaminophen (PERCOCET) 5-325 MG per tablet Take 1-2 tablets by mouth every 6 (six) hours as needed for severe pain. 05/02/14   Catalina Salasar M Delbert Vu, PA-C   BP 117/81 mmHg  Pulse 110  Temp(Src) 98.4 F (36.9 C) (Oral)  Resp 26  Ht 5\' 7"  (1.702 m)  Wt 315 lb (142.883 kg)  BMI 49.32 kg/m2  SpO2 97%  LMP 05/22/2013 Physical Exam  Constitutional: She is oriented to person, place, and time. She appears well-developed and well-nourished. No distress.  HENT:  Head: Normocephalic and atraumatic.  Mouth/Throat:  Oropharynx is clear and moist.  Eyes: Conjunctivae and EOM are normal.  Neck: Normal range of motion. Neck supple.  Cardiovascular: Regular rhythm and normal heart sounds.  Tachycardia present.   Pulmonary/Chest: Effort normal and breath sounds normal. No respiratory distress. She has no decreased breath sounds.  TTP R lower anterior, lateral and posterior ribs. No bruising, crepitus or step-off.  Musculoskeletal: Normal range of motion. She exhibits no edema.  Neurological: She is alert and oriented to person, place, and time. No sensory deficit.  Skin: Skin is warm and dry.  Psychiatric: She has a normal mood and affect. Her behavior is normal.  Nursing note and vitals reviewed.   ED Course  Procedures (including critical care time) Labs Review Labs Reviewed - No data to display  Imaging Review Dg Ribs Unilateral W/chest Right  05/02/2014   CLINICAL DATA:  Chronic cough, emphysema, felt a pop in right lower ribs 4 days ago  EXAM: RIGHT RIBS AND CHEST - 3+ VIEW  COMPARISON:  06/05/2013  FINDINGS: Four views right ribs submitted. There is triangular shaped atelectasis or infiltrate in right lower lobe. Follow-up to resolution is recommended. Streaky atelectasis or scarring in left lower lobe. No pneumothorax. There is subtle deformity of the right eighth rib. Nondisplaced fracture cannot be excluded. Clinical correlation is necessary.  IMPRESSION: There is triangular shaped atelectasis or infiltrate in right lower lobe. Follow-up to resolution is recommended. Streaky atelectasis or scarring in left lower lobe. No pneumothorax. There is subtle deformity of the right eighth rib. Nondisplaced fracture cannot be excluded. Clinical correlation is necessary.   Electronically Signed   By: Natasha MeadLiviu  Pop M.D.   On: 05/02/2014 15:16     EKG Interpretation None      MDM   Final diagnoses:  CAP (community acquired pneumonia)  Right rib fracture, closed, initial encounter   Nontoxic appearing, NAD.  Afebrile. Tachycardic, vitals otherwise stable. Tenderness on the right side of ribs without crepitus or step-off. Lungs clear. O2 sat 97% on RA. Chest x-ray showing triangular shaped atelectasis or infiltrate in right lower lobe, subtle deformity of the right eighth rib, not excluding a nondisplaced fracture. Will start patient on Levaquin, Percocet for pain and Tessalon. Advised follow-up with PCP within one week. She is stable for outpatient treatment. Stable for discharge. Return precautions given. Patient states understanding of treatment care plan and is agreeable.  Kathrynn SpeedRobyn M Samari Bittinger, PA-C 05/02/14 1528  Shon Batonourtney F Horton, MD 05/05/14 [phone]

## 2014-05-02 NOTE — Discharge Instructions (Signed)
Take antibiotic to completion. Take percocet for severe pain only. No driving or operating heavy machinery while taking percocet. This medication may cause drowsiness. Take tessalon as directed for cough.  Rib Fracture A rib fracture is a break or crack in one of the bones of the ribs. The ribs are a group of long, curved bones that wrap around your chest and attach to your spine. They protect your lungs and other organs in the chest cavity. A broken or cracked rib is often painful, but most do not cause other problems. Most rib fractures heal on their own over time. However, rib fractures can be more serious if multiple ribs are broken or if broken ribs move out of place and push against other structures. CAUSES   A direct blow to the chest. For example, this could happen during contact sports, a car accident, or a fall against a hard object.  Repetitive movements with high force, such as pitching a baseball or having severe coughing spells. SYMPTOMS   Pain when you breathe in or cough.  Pain when someone presses on the injured area. DIAGNOSIS  Your caregiver will perform a physical exam. Various imaging tests may be ordered to confirm the diagnosis and to look for related injuries. These tests may include a chest X-ray, computed tomography (CT), magnetic resonance imaging (MRI), or a bone scan. TREATMENT  Rib fractures usually heal on their own in 1-3 months. The longer healing period is often associated with a continued cough or other aggravating activities. During the healing period, pain control is very important. Medication is usually given to control pain. Hospitalization or surgery may be needed for more severe injuries, such as those in which multiple ribs are broken or the ribs have moved out of place.  HOME CARE INSTRUCTIONS   Avoid strenuous activity and any activities or movements that cause pain. Be careful during activities and avoid bumping the injured rib.  Gradually increase  activity as directed by your caregiver.  Only take over-the-counter or prescription medications as directed by your caregiver. Do not take other medications without asking your caregiver first.  Apply ice to the injured area for the first 1-2 days after you have been treated or as directed by your caregiver. Applying ice helps to reduce inflammation and pain.  Put ice in a plastic bag.  Place a towel between your skin and the bag.   Leave the ice on for 15-20 minutes at a time, every 2 hours while you are awake.  Perform deep breathing as directed by your caregiver. This will help prevent pneumonia, which is a common complication of a broken rib. Your caregiver may instruct you to:  Take deep breaths several times a day.  Try to cough several times a day, holding a pillow against the injured area.  Use a device called an incentive spirometer to practice deep breathing several times a day.  Drink enough fluids to keep your urine clear or pale yellow. This will help you avoid constipation.   Do not wear a rib belt or binder. These restrict breathing, which can lead to pneumonia.  SEEK IMMEDIATE MEDICAL CARE IF:   You have a fever.   You have difficulty breathing or shortness of breath.   You develop a continual cough, or you cough up thick or bloody sputum.  You feel sick to your stomach (nausea), throw up (vomit), or have abdominal pain.   You have worsening pain not controlled with medications.  MAKE SURE YOU:  Understand these instructions.  Will watch your condition.  Will get help right away if you are not doing well or get worse. Document Released: 01/10/2005 Document Revised: 09/12/2012 Document Reviewed: 03/14/2012 [facility] Patient Information 2015 [place], [place]. This information is not intended to replace advice given to you by your health care provider. Make sure you discuss any questions you have with your health care provider.  Pneumonia Pneumonia is an  infection of the lungs.  CAUSES Pneumonia may be caused by bacteria or a virus. Usually, these infections are caused by breathing infectious particles into the lungs (respiratory tract). SIGNS AND SYMPTOMS   Cough.  Fever.  Chest pain.  Increased rate of breathing.  Wheezing.  Mucus production. DIAGNOSIS  If you have the common symptoms of pneumonia, your health care provider will typically confirm the diagnosis with a chest X-ray. The X-ray will show an abnormality in the lung (pulmonary infiltrate) if you have pneumonia. Other tests of your blood, urine, or sputum may be done to find the specific cause of your pneumonia. Your health care provider may also do tests (blood gases or pulse oximetry) to see how well your lungs are working. TREATMENT  Some forms of pneumonia may be spread to other people when you cough or sneeze. You may be asked to wear a mask before and during your exam. Pneumonia that is caused by bacteria is treated with antibiotic medicine. Pneumonia that is caused by the influenza virus may be treated with an antiviral medicine. Most other viral infections must run their course. These infections will not respond to antibiotics.  HOME CARE INSTRUCTIONS   Cough suppressants may be used if you are losing too much rest. However, coughing protects you by clearing your lungs. You should avoid using cough suppressants if you can.  Your health care provider may have prescribed medicine if he or she thinks your pneumonia is caused by bacteria or influenza. Finish your medicine even if you start to feel better.  Your health care provider may also prescribe an expectorant. This loosens the mucus to be coughed up.  Take medicines only as directed by your health care provider.  Do not smoke. Smoking is a common cause of bronchitis and can contribute to pneumonia. If you are a smoker and continue to smoke, your cough may last several weeks after your pneumonia has cleared.  A  cold steam vaporizer or humidifier in your room or home may help loosen mucus.  Coughing is often worse at night. Sleeping in a semi-upright position in a recliner or using a couple pillows under your head will help with this.  Get rest as you feel it is needed. Your body will usually let you know when you need to rest. PREVENTION A pneumococcal shot (vaccine) is available to prevent a common bacterial cause of pneumonia. This is usually suggested for:  People over 90 years old.  Patients on chemotherapy.  People with chronic lung problems, such as bronchitis or emphysema.  People with immune system problems. If you are over 65 or have a high risk condition, you may receive the pneumococcal vaccine if you have not received it before. In some countries, a routine influenza vaccine is also recommended. This vaccine can help prevent some cases of pneumonia.You may be offered the influenza vaccine as part of your care. If you smoke, it is time to quit. You may receive instructions on how to stop smoking. Your health care provider can provide medicines and counseling to help you quit.  SEEK MEDICAL CARE IF: You have a fever. SEEK IMMEDIATE MEDICAL CARE IF:   Your illness becomes worse. This is especially true if you are elderly or weakened from any other disease.  You cannot control your cough with suppressants and are losing sleep.  You begin coughing up blood.  You develop pain which is getting worse or is uncontrolled with medicines.  Any of the symptoms which initially brought you in for treatment are getting worse rather than better.  You develop shortness of breath or chest pain. MAKE SURE YOU:   Understand these instructions.  Will watch your condition.  Will get help right away if you are not doing well or get worse. Document Released: 01/10/2005 Document Revised: 05/27/2013 Document Reviewed: 04/01/2010 [facility] Patient Information 2015 [place]. This information is  not intended to replace advice given to you by your health care provider. Make sure you discuss any questions you have with your health care provider.

## 2014-06-06 ENCOUNTER — Ambulatory Visit (HOSPITAL_BASED_OUTPATIENT_CLINIC_OR_DEPARTMENT_OTHER): Payer: Medicaid Other | Attending: Internal Medicine

## 2014-07-01 ENCOUNTER — Ambulatory Visit (HOSPITAL_BASED_OUTPATIENT_CLINIC_OR_DEPARTMENT_OTHER): Payer: Medicare Other | Attending: Internal Medicine

## 2014-09-01 ENCOUNTER — Encounter (HOSPITAL_BASED_OUTPATIENT_CLINIC_OR_DEPARTMENT_OTHER): Payer: Self-pay

## 2014-09-01 ENCOUNTER — Emergency Department (HOSPITAL_BASED_OUTPATIENT_CLINIC_OR_DEPARTMENT_OTHER): Payer: Medicare Other

## 2014-09-01 ENCOUNTER — Emergency Department (HOSPITAL_BASED_OUTPATIENT_CLINIC_OR_DEPARTMENT_OTHER)
Admission: EM | Admit: 2014-09-01 | Discharge: 2014-09-01 | Disposition: A | Discharge status: Home / Self Care | Payer: Medicare Other | Attending: Emergency Medicine | Admitting: Emergency Medicine

## 2014-09-01 DIAGNOSIS — Z8701 Personal history of pneumonia (recurrent): Secondary | ICD-10-CM | POA: Insufficient documentation

## 2014-09-01 DIAGNOSIS — R058 Other specified cough: Secondary | ICD-10-CM

## 2014-09-01 DIAGNOSIS — F329 Major depressive disorder, single episode, unspecified: Secondary | ICD-10-CM | POA: Insufficient documentation

## 2014-09-01 DIAGNOSIS — F419 Anxiety disorder, unspecified: Secondary | ICD-10-CM | POA: Diagnosis not present

## 2014-09-01 DIAGNOSIS — R05 Cough: Secondary | ICD-10-CM | POA: Diagnosis present

## 2014-09-01 DIAGNOSIS — Z8781 Personal history of (healed) traumatic fracture: Secondary | ICD-10-CM | POA: Diagnosis not present

## 2014-09-01 DIAGNOSIS — Z7951 Long term (current) use of inhaled steroids: Secondary | ICD-10-CM | POA: Insufficient documentation

## 2014-09-01 DIAGNOSIS — Z8739 Personal history of other diseases of the musculoskeletal system and connective tissue: Secondary | ICD-10-CM | POA: Insufficient documentation

## 2014-09-01 DIAGNOSIS — Z87891 Personal history of nicotine dependence: Secondary | ICD-10-CM | POA: Diagnosis not present

## 2014-09-01 DIAGNOSIS — R0789 Other chest pain: Secondary | ICD-10-CM | POA: Insufficient documentation

## 2014-09-01 DIAGNOSIS — Z3202 Encounter for pregnancy test, result negative: Secondary | ICD-10-CM | POA: Diagnosis not present

## 2014-09-01 DIAGNOSIS — Z79899 Other long term (current) drug therapy: Secondary | ICD-10-CM | POA: Diagnosis not present

## 2014-09-01 DIAGNOSIS — J449 Chronic obstructive pulmonary disease, unspecified: Secondary | ICD-10-CM | POA: Diagnosis not present

## 2014-09-01 HISTORY — DX: Fracture of one rib, unspecified side, initial encounter for closed fracture: S22.39XA

## 2014-09-01 HISTORY — DX: Pneumonia, unspecified organism: J18.9

## 2014-09-01 LAB — BASIC METABOLIC PANEL
ANION GAP: 8 (ref 5–15)
BUN: 6 mg/dL (ref 6–20)
CALCIUM: 8.6 mg/dL — AB (ref 8.9–10.3)
CHLORIDE: 105 mmol/L (ref 101–111)
CO2: 27 mmol/L (ref 22–32)
CREATININE: 0.76 mg/dL (ref 0.44–1.00)
GFR calc Af Amer: >60 mL/min (ref >60)
GLUCOSE: 96 mg/dL (ref 65–99)
Potassium: 3.6 mmol/L (ref 3.5–5.1)
Sodium: 140 mmol/L (ref 135–145)

## 2014-09-01 LAB — CBC WITH DIFFERENTIAL/PLATELET
BASOS PCT: 0 % (ref 0–1)
Basophils Absolute: 0 10*3/uL (ref 0.0–0.1)
EOS ABS: 0.3 10*3/uL (ref 0.0–0.7)
EOS PCT: 2 % (ref 0–5)
HCT: 42.9 % (ref 36.0–46.0)
Hemoglobin: 13.5 g/dL (ref 12.0–15.0)
LYMPHS ABS: 4.2 10*3/uL — AB (ref 0.7–4.0)
Lymphocytes Relative: 26 % (ref 12–46)
MCH: 27.6 pg (ref 26.0–34.0)
MCHC: 31.5 g/dL (ref 30.0–36.0)
MCV: 87.6 fL (ref 78.0–100.0)
MONO ABS: 1.2 10*3/uL — AB (ref 0.1–1.0)
Monocytes Relative: 7 % (ref 3–12)
Neutro Abs: 10.2 10*3/uL — ABNORMAL HIGH (ref 1.7–7.7)
Neutrophils Relative %: 65 % (ref 43–77)
Platelets: 404 10*3/uL — ABNORMAL HIGH (ref 150–400)
RBC: 4.9 MIL/uL (ref 3.87–5.11)
RDW: 14.8 % (ref 11.5–15.5)
WBC: 15.9 10*3/uL — AB (ref 4.0–10.5)

## 2014-09-01 LAB — D-DIMER, QUANTITATIVE: D-Dimer, Quant: 0.37 ug/mL-FEU (ref 0.00–0.48)

## 2014-09-01 LAB — PREGNANCY, URINE: PREG TEST UR: NEGATIVE

## 2014-09-01 MED ORDER — HYDROCODONE-HOMATROPINE 5-1.5 MG/5ML PO SYRP: 5.0000 mL | ORAL_SOLUTION | Freq: Four times a day (QID) | ORAL | Status: DC | PRN

## 2014-09-01 MED ORDER — METHOCARBAMOL 500 MG PO TABS: 500.0000 mg | ORAL_TABLET | Freq: Two times a day (BID) | ORAL | Status: DC | PRN

## 2014-09-01 MED ORDER — HYDROCODONE-ACETAMINOPHEN 5-325 MG PO TABS
1.0000 | ORAL_TABLET | Freq: Once | ORAL | Status: DC
Start: 2014-09-01 — End: 2014-09-01

## 2014-09-01 NOTE — ED Notes (Signed)
Pa  at bedside. 

## 2014-09-01 NOTE — ED Notes (Signed)
Prod cough, right rib pain x 3 days-denies injury-hx of same with dx of "cracked rib and walking pneumonia"-pt NAD

## 2014-09-01 NOTE — ED Provider Notes (Signed)
CSN: [id number]     Arrival date & time 09/01/14  1247 History   First MD Initiated Contact with Patient 09/01/14 1258     Chief Complaint  Patient presents with  . Cough    HPI   [name] is a 34 year old female with a PMH of COPD, asthma, emphysema, tobacco abuse, anxiety, and depression who presents with 3 days of right rib pain and cough productive of brownish sputum. She reports her pain is exacerbated by moving, coughing, and deep inspiration. She has tried ibuprofen for symptom relief, which has not been effective. She denies fever, chills, chest pain, palpitations, shortness of breath, recent travel, leg swelling, history of cancer. She states she is not currently smoking.  Past Medical History  Diagnosis Date  . COPD (chronic obstructive pulmonary disease)   . Asthma   . Anxiety   . Emphysema   . Depression   . Bipolar 1 disorder   . Arthritis   . Pneumonia   . Rib fracture    Past Surgical History  Procedure Laterality Date  . Cholecystectomy    . Tubal ligation     No family history on file. History  Substance Use Topics  . Smoking status: Former Smoker -- 0.00 packs/day    Quit date: 05/07/2011  . Smokeless tobacco: Not on file  . Alcohol Use: No   OB History    Gravida Para Term Preterm AB TAB SAB Ectopic Multiple Living   0 3 0 3 0 0 3     Review of Systems  Constitutional: Positive for fatigue. Negative for fever and chills.       Reports fatigue over the past few days.  HENT: Negative for congestion and rhinorrhea.   Respiratory: Positive for cough. Negative for shortness of breath.   Cardiovascular: Negative for chest pain, palpitations and leg swelling.  Gastrointestinal: Negative for nausea, vomiting, abdominal pain, diarrhea, constipation and abdominal distention.  Musculoskeletal: Negative for back pain, neck pain and neck stiffness.       Reports right sided pain over ribs.  Neurological: Negative for dizziness, weakness,  light-headedness and headaches.     Allergies  Review of patient's allergies indicates no known allergies.  Home Medications   Prior to Admission medications   Medication Sig Start Date End Date Taking? Authorizing Provider  albuterol (PROVENTIL HFA;VENTOLIN HFA) 108 (90 BASE) MCG/ACT inhaler Inhale 2 puffs into the lungs every 6 (six) hours as needed. For shortness of breath.     Historical Provider, MD  beclomethasone (QVAR) 80 MCG/ACT inhaler Inhale 2 puffs into the lungs 2 (two) times daily.    Historical Provider, MD  clonazePAM (KLONOPIN) 0.5 MG tablet Take 2 tablets (1 mg total) by mouth 2 (two) times daily as needed for anxiety (take  every am and .  every pm ). 01/29/11 05/06/12  Jethro Bastos, NP  clonazePAM (KLONOPIN) 1 MG tablet Take 1 mg by mouth 2 (two) times daily as needed. For anxiety.    Historical Provider, MD  escitalopram (LEXAPRO) 20 MG tablet Take 20 mg by mouth daily.      Historical Provider, MD  gabapentin (NEURONTIN) 300 MG capsule Take 300 mg by mouth 3 (three) times daily.      Historical Provider, MD  ibuprofen (ADVIL,MOTRIN) 800 MG tablet Take 1 tablet (800 mg total) by mouth 3 (three) times daily. 05/31/13   Elwin Mocha, MD  lurasidone (LATUDA) 80 MG TABS tablet Take 80 mg by mouth  daily with breakfast.    Historical Provider, MD  traZODone (DESYREL) 150 MG tablet Take 150 mg by mouth at bedtime.    Historical Provider, MD    BP 146/92 mmHg  Pulse 106  Temp(Src) 98.8 F (37.1 C) (Oral)  Resp 20  Ht 5\' 7"  (1.702 m)  Wt 348 lb 3.2 oz (157.942 kg)  BMI 54.52 kg/m2  SpO2 98%  LMP 08/27/2014 Physical Exam  Constitutional: She is oriented to person, place, and time. She appears well-developed and well-nourished. No distress.  HENT:  Head: Normocephalic and atraumatic.  Right Ear: External ear normal.  Left Ear: External ear normal.  Mouth/Throat: Oropharynx is clear and moist.  Eyes: Conjunctivae and EOM are normal. Pupils are equal, round, and  reactive to light.  Neck: Normal range of motion. Neck supple.  Cardiovascular: Normal rate, regular rhythm, normal heart sounds and intact distal pulses.   Pulmonary/Chest: Effort normal and breath sounds normal. No respiratory distress.  Abdominal: Soft. Bowel sounds are normal. She exhibits no distension and no mass. There is no tenderness. There is no rebound and no guarding.  Musculoskeletal: Normal range of motion. She exhibits tenderness.  Tenderness to palpation over posterior and lateral aspect of right ribs  Lymphadenopathy:    She has no cervical adenopathy.  Neurological: She is alert and oriented to person, place, and time.  Skin: Skin is warm and dry. No rash noted. She is not diaphoretic. No erythema. No pallor.  Psychiatric: She has a normal mood and affect. Her behavior is normal.  Nursing note and vitals reviewed.   ED Course  Procedures (including critical care time)  Labs Review Labs Reviewed  CBC WITH DIFFERENTIAL/PLATELET - Abnormal; Notable for the following:    WBC 15.9 (*)    Platelets 404 (*)    Neutro Abs 10.2 (*)    Lymphs Abs 4.2 (*)    Monocytes Absolute 1.2 (*)    All other components within normal limits  BASIC METABOLIC PANEL - Abnormal; Notable for the following:    Calcium 8.6 (*)    All other components within normal limits  D-DIMER, QUANTITATIVE (NOT AT [facility])  PREGNANCY, URINE    Imaging Review Dg Chest 2 View  09/01/2014   CLINICAL DATA:  Productive cough and right rib pain for 3 days.  EXAM: CHEST  2 VIEW  COMPARISON:  Single view of the chest 06/05/2013.  FINDINGS: The lungs are clear. Heart size is normal. There is no pneumothorax or pleural effusion. Thoracolumbar scoliosis is noted.  IMPRESSION: No acute disease.   Electronically Signed   By: Drusilla Kanner M.D.   On: 09/01/2014 14:13     EKG Interpretation None      MDM   Final diagnoses:  Cough with sputum  Right-sided chest wall pain   34 year old female with PMH of  tobacco abuse, COPD, asthma, emphysema presents with 3 days of right sided rib pain and cough productive of brown sputum. Chest x-ray negative for acute disease. No fever, chills, chest pain, shortness of breath. Doubt pnuemonia. No history of DVT, no recent travel or immobility, no history of malignancy, no estrogen use. Reports she is not currently smoking. No lower extremity edema on exam. O2 sat 95%. Tachycardic to 106 in the ED. D-dimer was obtained, which was negative. No evidence of PE. Productive cough consistent with viral etiology. WBC count on CBC elevated at 15.9. Right sided rib pain likely due to musculoskeletal strain given coughing. Pain controlled in the  ED. Patient to be discharged home with hycodan and robaxin. Return precautions discussed. Follow up with PCP this week.   BP 115/72 mmHg  Pulse 100  Temp(Src) 98.3 F (36.8 C) (Oral)  Resp 18  Ht 5\' 7"  (1.702 m)  Wt 348 lb 3.2 oz (157.942 kg)  BMI 54.52 kg/m2  SpO2 95%  LMP 08/27/2014    Mady Gemma, PA-C 09/01/14 1810  Mirian Mo, MD 09/03/14 2250

## 2014-09-01 NOTE — Discharge Instructions (Signed)
1. Medications: hycodan, robaxin, usual home medications 2. Treatment: rest, drink plenty of fluids 3. Follow Up: please followup with your primary doctor this week for discussion of your diagnoses and further evaluation after today's visit; please return to the ER for new or worsening symptoms   Chest Wall Pain Chest wall pain is pain in or around the bones and muscles of your chest. It may take up to 6 weeks to get better. It may take longer if you must stay physically active in your work and activities.  CAUSES  Chest wall pain may happen on its own. However, it may be caused by:  A viral illness like the flu.  Injury.  Coughing.  Exercise.  Arthritis.  Fibromyalgia.  Shingles. HOME CARE INSTRUCTIONS   Avoid overtiring physical activity. Try not to strain or perform activities that cause pain. This includes any activities using your chest or your abdominal and side muscles, especially if heavy weights are used.  Put ice on the sore area.  Put ice in a plastic bag.  Place a towel between your skin and the bag.  Leave the ice on for 15-20 minutes per hour while awake for the first 2 days.  Only take over-the-counter or prescription medicines for pain, discomfort, or fever as directed by your caregiver. SEEK IMMEDIATE MEDICAL CARE IF:   Your pain increases, or you are very uncomfortable.  You have a fever.  Your chest pain becomes worse.  You have new, unexplained symptoms.  You have nausea or vomiting.  You feel sweaty or lightheaded.  You have a cough with phlegm (sputum), or you cough up blood. MAKE SURE YOU:   Understand these instructions.  Will watch your condition.  Will get help right away if you are not doing well or get worse. Document Released: 01/10/2005 Document Revised: 04/04/2011 Document Reviewed: 09/06/2010 [facility] Patient Information 2015 [place], [place]. This information is not intended to replace advice given to you by your health care  provider. Make sure you discuss any questions you have with your health care provider.  Cough, Adult  A cough is a reflex that helps clear your throat and airways. It can help heal the body or may be a reaction to an irritated airway. A cough may only last 2 or 3 weeks (acute) or may last more than 8 weeks (chronic).  CAUSES Acute cough:  Viral or bacterial infections. Chronic cough:  Infections.  Allergies.  Asthma.  Post-nasal drip.  Smoking.  Heartburn or acid reflux.  Some medicines.  Chronic lung problems (COPD).  Cancer. SYMPTOMS   Cough.  Fever.  Chest pain.  Increased breathing rate.  High-pitched whistling sound when breathing (wheezing).  Colored mucus that you cough up (sputum). TREATMENT   A bacterial cough may be treated with antibiotic medicine.  A viral cough must run its course and will not respond to antibiotics.  Your caregiver may recommend other treatments if you have a chronic cough. HOME CARE INSTRUCTIONS   Only take over-the-counter or prescription medicines for pain, discomfort, or fever as directed by your caregiver. Use cough suppressants only as directed by your caregiver.  Use a cold steam vaporizer or humidifier in your bedroom or home to help loosen secretions.  Sleep in a semi-upright position if your cough is worse at night.  Rest as needed.  Stop smoking if you smoke. SEEK IMMEDIATE MEDICAL CARE IF:   You have pus in your sputum.  Your cough starts to worsen.  You cannot control your  cough with suppressants and are losing sleep.  You begin coughing up blood.  You have difficulty breathing.  You develop pain which is getting worse or is uncontrolled with medicine.  You have a fever. MAKE SURE YOU:   Understand these instructions.  Will watch your condition.  Will get help right away if you are not doing well or get worse. Document Released: 07/09/2010 Document Revised: 04/04/2011 Document Reviewed:  07/09/2010 [facility] Patient Information 2015 [place], [place]. This information is not intended to replace advice given to you by your health care provider. Make sure you discuss any questions you have with your health care provider.

## 2014-09-01 NOTE — ED Notes (Signed)
Pt states she drove self and will get a ride, pt will inform nurse when ride arrives to receive pain meds

## 2014-09-03 ENCOUNTER — Encounter (HOSPITAL_BASED_OUTPATIENT_CLINIC_OR_DEPARTMENT_OTHER): Payer: Medicaid Other

## 2014-09-05 ENCOUNTER — Ambulatory Visit: Payer: Medicare Other | Admitting: Family Medicine

## 2014-09-26 ENCOUNTER — Ambulatory Visit: Payer: Medicare Other | Admitting: Obstetrics & Gynecology

## 2014-10-01 ENCOUNTER — Ambulatory Visit: Payer: Medicare Other | Admitting: Obstetrics & Gynecology

## 2014-10-18 ENCOUNTER — Emergency Department (HOSPITAL_BASED_OUTPATIENT_CLINIC_OR_DEPARTMENT_OTHER)
Admission: EM | Admit: 2014-10-18 | Discharge: 2014-10-18 | Disposition: A | Discharge status: Home / Self Care | Payer: Medicare Other | Attending: Physician Assistant | Admitting: Physician Assistant

## 2014-10-18 ENCOUNTER — Emergency Department (HOSPITAL_BASED_OUTPATIENT_CLINIC_OR_DEPARTMENT_OTHER): Payer: Medicare Other

## 2014-10-18 ENCOUNTER — Encounter (HOSPITAL_BASED_OUTPATIENT_CLINIC_OR_DEPARTMENT_OTHER): Payer: Self-pay | Admitting: Emergency Medicine

## 2014-10-18 DIAGNOSIS — F319 Bipolar disorder, unspecified: Secondary | ICD-10-CM | POA: Insufficient documentation

## 2014-10-18 DIAGNOSIS — R079 Chest pain, unspecified: Secondary | ICD-10-CM | POA: Diagnosis not present

## 2014-10-18 DIAGNOSIS — Z7951 Long term (current) use of inhaled steroids: Secondary | ICD-10-CM | POA: Insufficient documentation

## 2014-10-18 DIAGNOSIS — J441 Chronic obstructive pulmonary disease with (acute) exacerbation: Secondary | ICD-10-CM | POA: Insufficient documentation

## 2014-10-18 DIAGNOSIS — Z79899 Other long term (current) drug therapy: Secondary | ICD-10-CM | POA: Insufficient documentation

## 2014-10-18 DIAGNOSIS — J189 Pneumonia, unspecified organism: Secondary | ICD-10-CM

## 2014-10-18 DIAGNOSIS — J159 Unspecified bacterial pneumonia: Secondary | ICD-10-CM | POA: Diagnosis not present

## 2014-10-18 DIAGNOSIS — Z87891 Personal history of nicotine dependence: Secondary | ICD-10-CM | POA: Diagnosis not present

## 2014-10-18 DIAGNOSIS — Z8781 Personal history of (healed) traumatic fracture: Secondary | ICD-10-CM | POA: Diagnosis not present

## 2014-10-18 DIAGNOSIS — M1990 Unspecified osteoarthritis, unspecified site: Secondary | ICD-10-CM | POA: Insufficient documentation

## 2014-10-18 DIAGNOSIS — R0682 Tachypnea, not elsewhere classified: Secondary | ICD-10-CM

## 2014-10-18 DIAGNOSIS — F419 Anxiety disorder, unspecified: Secondary | ICD-10-CM | POA: Diagnosis not present

## 2014-10-18 DIAGNOSIS — R0781 Pleurodynia: Secondary | ICD-10-CM

## 2014-10-18 DIAGNOSIS — R0602 Shortness of breath: Secondary | ICD-10-CM | POA: Diagnosis present

## 2014-10-18 LAB — BASIC METABOLIC PANEL
Anion gap: 8 (ref 5–15)
BUN: 7 mg/dL (ref 6–20)
CHLORIDE: 106 mmol/L (ref 101–111)
CO2: 25 mmol/L (ref 22–32)
Calcium: 9 mg/dL (ref 8.9–10.3)
Creatinine, Ser: 0.86 mg/dL (ref 0.44–1.00)
GFR calc non Af Amer: >60 mL/min (ref >60)
Glucose, Bld: 157 mg/dL — ABNORMAL HIGH (ref 65–99)
POTASSIUM: 3.4 mmol/L — AB (ref 3.5–5.1)
SODIUM: 139 mmol/L (ref 135–145)

## 2014-10-18 LAB — CBC
HEMATOCRIT: 42.9 % (ref 36.0–46.0)
Hemoglobin: 13.7 g/dL (ref 12.0–15.0)
MCH: 27.2 pg (ref 26.0–34.0)
MCHC: 31.9 g/dL (ref 30.0–36.0)
MCV: 85.1 fL (ref 78.0–100.0)
PLATELETS: 451 10*3/uL — AB (ref 150–400)
RBC: 5.04 MIL/uL (ref 3.87–5.11)
RDW: 14.9 % (ref 11.5–15.5)
WBC: 13.4 10*3/uL — AB (ref 4.0–10.5)

## 2014-10-18 MED ORDER — IOHEXOL 350 MG/ML SOLN
125.0000 mL | Freq: Once | INTRAVENOUS | Status: AC | PRN
  Administered 2014-10-18: 125 mL via INTRAVENOUS

## 2014-10-18 MED ORDER — SODIUM CHLORIDE 0.9 % IV BOLUS (SEPSIS)
1000.0000 mL | Freq: Once | INTRAVENOUS | Status: AC
  Administered 2014-10-18: 1000 mL via INTRAVENOUS

## 2014-10-18 MED ORDER — AZITHROMYCIN 250 MG PO TABS
500.0000 mg | ORAL_TABLET | Freq: Once | ORAL | Status: AC
  Administered 2014-10-18: 500 mg via ORAL
  Filled 2014-10-18: qty 2

## 2014-10-18 MED ORDER — AZITHROMYCIN 250 MG PO TABS: 250.0000 mg | ORAL_TABLET | Freq: Once | ORAL | Status: DC

## 2014-10-18 NOTE — Discharge Instructions (Signed)
Chest x-ray looks clear. Your CT shows no pulmonary embolism. We will treat you in case there is a small pneumonia on the right side. Will give you antibiotics for this. Please follow up with your regular provider this week.

## 2014-10-18 NOTE — ED Notes (Signed)
Patient reports that she has been "sick" for the last 2 weeks. The patient reports that today she is having pain to her right ribs, and cant lay down. Very SOB and having chest tightness and dizziness

## 2014-10-18 NOTE — ED Provider Notes (Signed)
CSN: [id number]     Arrival date & time 10/18/14  2017 History  This chart was scribed for Courteney Randall An, MD by Lyndel Safe, ED Scribe. This patient was seen in room MH08/MH08 and the patient's care was started 9:16 PM.  Chief Complaint  Patient presents with  . Shortness of Breath   The history is provided by the patient. No language interpreter was used.   HPI Comments: [name] is a 34 y.o. female, with a PMhx of COPD, asthma, emphysema, and obesity, who presents to the Emergency Department complaining of gradually worsening URI symptoms onset 2 weeks ago. The pt reports associated right-sided rib cage pain that she attributes to her worsening cough, a productive cough with brown sputum, SOB, and chest tightness. Her SOB is exacerbated with lying supine.The pt was seen in the ED over a month ago for c/o productive cough with brown sputum and right-sided rib cage pain. On this prior ED visit the pt was discharged with Hydocan and robaxin after an unremarkable workup that included a chest Xray and d-dimer labs.  Pt not taking oral estrogen medication; PShx of tubal ligation. Denies current smoking, fevers, recent long travel or recent surgery, or PMhx of DVT/PE.   Past Medical History  Diagnosis Date  . COPD (chronic obstructive pulmonary disease)   . Asthma   . Anxiety   . Emphysema   . Depression   . Bipolar 1 disorder   . Arthritis   . Pneumonia   . Rib fracture    Past Surgical History  Procedure Laterality Date  . Cholecystectomy    . Tubal ligation     History reviewed. No pertinent family history. Social History  Substance Use Topics  . Smoking status: Former Smoker -- 0.00 packs/day    Quit date: 05/07/2011  . Smokeless tobacco: None  . Alcohol Use: No   OB History    Gravida Para Term Preterm AB TAB SAB Ectopic Multiple Living   0 3 0 3 0 0 3     Review of Systems  Constitutional: Negative for fever.  Respiratory: Positive for cough,  chest tightness and shortness of breath.   Musculoskeletal: Positive for arthralgias ( right rib cage).  All other systems reviewed and are negative.  Allergies  Review of patient's allergies indicates no known allergies.  Home Medications   Prior to Admission medications   Medication Sig Start Date End Date Taking? Authorizing Provider  albuterol (PROVENTIL HFA;VENTOLIN HFA) 108 (90 BASE) MCG/ACT inhaler Inhale 2 puffs into the lungs every 6 (six) hours as needed. For shortness of breath.     Historical Provider, MD  beclomethasone (QVAR) 80 MCG/ACT inhaler Inhale 2 puffs into the lungs 2 (two) times daily.    Historical Provider, MD  clonazePAM (KLONOPIN) 0.5 MG tablet Take 2 tablets (1 mg total) by mouth 2 (two) times daily as needed for anxiety (take  every am and .  every pm ). 01/29/11 05/06/12  Jethro Bastos, NP  clonazePAM (KLONOPIN) 1 MG tablet Take 1 mg by mouth 2 (two) times daily as needed. For anxiety.    Historical Provider, MD  escitalopram (LEXAPRO) 20 MG tablet Take 20 mg by mouth daily.      Historical Provider, MD  gabapentin (NEURONTIN) 300 MG capsule Take 300 mg by mouth 3 (three) times daily.      Historical Provider, MD  HYDROcodone-homatropine (HYCODAN) 5-1.5 MG/5ML syrup Take 5 mLs by mouth every 6 (six) hours as  needed for cough. 09/01/14   Mady Gemma, PA-C  ibuprofen (ADVIL,MOTRIN) 800 MG tablet Take 1 tablet (800 mg total) by mouth 3 (three) times daily. 05/31/13   Elwin Mocha, MD  lurasidone (LATUDA) 80 MG TABS tablet Take 80 mg by mouth daily with breakfast.    Historical Provider, MD  methocarbamol (ROBAXIN) 500 MG tablet Take 1 tablet (500 mg total) by mouth 2 (two) times daily as needed for muscle spasms. 09/01/14   Mady Gemma, PA-C  traZODone (DESYREL) 150 MG tablet Take 150 mg by mouth at bedtime.    Historical Provider, MD   BP 103/66 mmHg  Pulse 113  Temp(Src) 98.1 F (36.7 C) (Oral)  Resp 28  Ht  (1.702 m)  Wt 350 lb (158.759  kg)  BMI 54.80 kg/m2  SpO2 100%  LMP 10/11/2014 Physical Exam  Constitutional: She is oriented to person, place, and time. She appears well-developed and well-nourished. No distress.  NAD.  HENT:  Head: Normocephalic.  Mouth/Throat: Oropharynx is clear and moist. No oropharyngeal exudate.  Eyes: Conjunctivae are normal.  Neck: Neck supple.  Cardiovascular: Normal rate, regular rhythm and normal heart sounds.   Pulmonary/Chest: Effort normal and breath sounds normal. No respiratory distress.  Abdominal: Soft. There is no tenderness.  Neurological: She is alert and oriented to person, place, and time. No cranial nerve deficit.  Alert and oriented X 3; MAE X 4.     ED Course  Procedures  DIAGNOSTIC STUDIES: Oxygen Saturation is 100% on [place] 2L, normal by my interpretation.    COORDINATION OF CARE: 9:24 PM Discussed treatment plan with pt at bedside and pt agreed to plan.   Labs Review Labs Reviewed  CBC - Abnormal; Notable for the following:    WBC 13.4 (*)    Platelets 451 (*)    All other components within normal limits  BASIC METABOLIC PANEL    Imaging Review No results found. I have personally reviewed and evaluated these images and lab results as part of my medical decision-making.   EKG Interpretation   Date/Time:  Saturday October 18 2014 20:35:06 EDT Ventricular Rate:  107 PR Interval:  144 QRS Duration: 104 QT Interval:  362 QTC Calculation: 483 R Axis:   -50 Text Interpretation:  Sinus tachycardia Left anterior fascicular block  Possible Anterior infarct , age undetermined Abnormal ECG no acute  ischemia Confirmed by Kandis Mannan ([id number]) on 10/18/2014 8:36:27 PM      MDM   Final diagnoses:  None  Patient is a morbidly obese 34 year old female with history of COPD asthma emphysema presenting today with shortness of breath. Patient was treated for community acquired pneumonia in the last year and for rib fracture year ago. Patient saying that she  feels like she has pneumonia and rib fractures again.  Unfortunately rad tech called me and patient is too obese to be able to get a rib fracture study done.  Patient does have cough. Tachycardia, white count.  Will have to get CT PE given unexplained symptoms (normal chest xray)  CT PE negative. Treating with azithro for CAP- R pleural effusion. Will have her follow up with PCP   I personally performed the services described in this documentation, which was scribed in my presence. The recorded information has been reviewed and is accurate.    Courteney Randall An, MD 10/18/14 2322

## 2014-10-18 NOTE — ED Notes (Signed)
MD at bedside. 

## 2015-01-22 ENCOUNTER — Other Ambulatory Visit (HOSPITAL_COMMUNITY)
Admission: RE | Admit: 2015-01-22 | Discharge: 2015-01-22 | Disposition: A | Discharge status: Home / Self Care | Payer: Medicare Other | Source: Ambulatory Visit | Attending: Obstetrics & Gynecology | Admitting: Obstetrics & Gynecology

## 2015-01-22 ENCOUNTER — Ambulatory Visit (INDEPENDENT_AMBULATORY_CARE_PROVIDER_SITE_OTHER): Payer: Medicare Other | Admitting: Obstetrics & Gynecology

## 2015-01-22 ENCOUNTER — Encounter: Payer: Self-pay | Admitting: Obstetrics & Gynecology

## 2015-01-22 VITALS — BP 134/86 | HR 121 | Temp 98.1°F | Ht 65.0 in | Wt 344.9 lb

## 2015-01-22 DIAGNOSIS — Z202 Contact with and (suspected) exposure to infections with a predominantly sexual mode of transmission: Secondary | ICD-10-CM

## 2015-01-22 DIAGNOSIS — Z0000 Encounter for general adult medical examination without abnormal findings: Secondary | ICD-10-CM

## 2015-01-22 DIAGNOSIS — Z01419 Encounter for gynecological examination (general) (routine) without abnormal findings: Secondary | ICD-10-CM | POA: Diagnosis present

## 2015-01-22 DIAGNOSIS — Z124 Encounter for screening for malignant neoplasm of cervix: Secondary | ICD-10-CM | POA: Diagnosis not present

## 2015-01-22 DIAGNOSIS — Z113 Encounter for screening for infections with a predominantly sexual mode of transmission: Secondary | ICD-10-CM

## 2015-01-22 DIAGNOSIS — Z1151 Encounter for screening for human papillomavirus (HPV): Secondary | ICD-10-CM | POA: Diagnosis present

## 2015-01-22 LAB — POCT URINALYSIS DIP (DEVICE)
Glucose, UA: NEGATIVE mg/dL
Hgb urine dipstick: NEGATIVE
LEUKOCYTES UA: NEGATIVE
Nitrite: NEGATIVE
PROTEIN: 30 mg/dL — AB
Urobilinogen, UA: 0.2 mg/dL (ref 0.0–1.0)
pH: 5.5 (ref 5.0–8.0)

## 2015-01-22 NOTE — Addendum Note (Signed)
Addended by: Jill SideAY, Tyrell Seifer L on: 01/22/2015 03:52 PM   Modules accepted: Orders

## 2015-01-22 NOTE — Progress Notes (Signed)
Subjective:    [name] is a 34 y.o. female who presents for an annual exam. The patient has no complaints today. The patient is sexually active. GYN screening history: last pap: was normal. The patient wears seatbelts: yes. The patient participates in regular exercise: no. Has the patient ever been transfused or tattooed?: not asked. The patient reports that there is not domestic violence in her life.   Menstrual History: OB History    Gravida Para Term Preterm AB TAB SAB Ectopic Multiple Living   6 3 3  0 3 0 3 0 0 3      Menarche age: [age]  Patient's last menstrual period was 01/18/2015 (exact date).    The following portions of the patient's history were reviewed and updated as appropriate: allergies, current medications, past family history, past medical history, past social history, past surgical history and problem list.  Review of Systems Pertinent items are noted in HPI.    Objective:    BP 134/86 mmHg  Pulse 121  Temp(Src) 98.1 F (36.7 C) (Oral)  Ht 5\' 5"  (1.651 m)  Wt 344 lb 14.4 oz (156.446 kg)  BMI 57.39 kg/m2  LMP 01/18/2015 (Exact Date)  General Appearance:    Alert, cooperative, no distress, appears stated age  Head:    Normocephalic, without obvious abnormality, atraumatic  Eyes:    PERRL, conjunctiva/corneas clear, EOM's intact, fundi    benign, both eyes  Ears:    Normal TM's and external ear canals, both ears  Nose:   Nares normal, septum midline, mucosa normal, no drainage    or sinus tenderness  Throat:   Lips, mucosa, and tongue normal; teeth and gums normal  Neck:   Supple, symmetrical, trachea midline, no adenopathy;    thyroid:  no enlargement/tenderness/nodules; no carotid   bruit or JVD  Back:     Symmetric, no curvature, ROM normal, no CVA tenderness  Lungs:     Clear to auscultation bilaterally, respirations unlabored  Chest Wall:    No tenderness or deformity   Heart:    Regular rate and rhythm, S1 and S2 normal, no murmur, rub   or  gallop  Breast Exam:    No tenderness, masses, or nipple abnormality  Abdomen:     Soft, non-tender, bowel sounds active all four quadrants,    no masses, no organomegaly, obese, benign, hydrenditis suppurativa  Genitalia:    Normal female without lesion, discharge or tenderness, cervix appears normal, no masses palpable     Extremities:   Extremities normal, atraumatic, no cyanosis or edema  Pulses:   2+ and symmetric all extremities  Skin:   Skin color, texture, turgor normal, no rashes or lesions  Lymph nodes:   Cervical, supraclavicular, and axillary nodes normal  Neurologic:   CNII-XII intact, normal strength, sensation and reflexes    throughout  .    Assessment:    Healthy female exam.    Plan:     Breast self exam technique reviewed and patient encouraged to perform self-exam monthly. STI testing per patient request   Thin prep pap with cotesting

## 2015-01-23 LAB — HIV ANTIBODY (ROUTINE TESTING W REFLEX): HIV: NONREACTIVE

## 2015-01-23 LAB — CYTOLOGY - PAP

## 2015-01-23 LAB — HEPATITIS C ANTIBODY: HCV Ab: NEGATIVE

## 2015-01-23 LAB — HEPATITIS B SURFACE ANTIGEN: HEP B S AG: NEGATIVE

## 2015-01-23 LAB — RPR

## 2015-01-24 LAB — GC/CHLAMYDIA PROBE AMP (~~LOC~~) NOT AT ARMC
Chlamydia: NEGATIVE
NEISSERIA GONORRHEA: NEGATIVE

## 2015-02-04 ENCOUNTER — Telehealth: Payer: Self-pay

## 2015-02-04 NOTE — Telephone Encounter (Signed)
Pt call transferred from the front desk.  Pt stated that she wanted to know what her results where.  I informed pt that all her results resulted negative.  Pt stated thank you no further questions.

## 2015-04-02 ENCOUNTER — Encounter (HOSPITAL_BASED_OUTPATIENT_CLINIC_OR_DEPARTMENT_OTHER): Payer: Self-pay | Admitting: *Deleted

## 2015-04-26 ENCOUNTER — Ambulatory Visit (HOSPITAL_BASED_OUTPATIENT_CLINIC_OR_DEPARTMENT_OTHER): Payer: Medicare Other

## 2015-08-01 ENCOUNTER — Emergency Department (HOSPITAL_BASED_OUTPATIENT_CLINIC_OR_DEPARTMENT_OTHER)
Admission: EM | Admit: 2015-08-01 | Discharge: 2015-08-01 | Disposition: A | Discharge status: Home / Self Care | Payer: Medicare Other | Attending: Emergency Medicine | Admitting: Emergency Medicine

## 2015-08-01 ENCOUNTER — Encounter (HOSPITAL_BASED_OUTPATIENT_CLINIC_OR_DEPARTMENT_OTHER): Payer: Self-pay | Admitting: Emergency Medicine

## 2015-08-01 DIAGNOSIS — K0889 Other specified disorders of teeth and supporting structures: Secondary | ICD-10-CM

## 2015-08-01 DIAGNOSIS — Z87891 Personal history of nicotine dependence: Secondary | ICD-10-CM | POA: Diagnosis not present

## 2015-08-01 DIAGNOSIS — J45909 Unspecified asthma, uncomplicated: Secondary | ICD-10-CM | POA: Diagnosis not present

## 2015-08-01 DIAGNOSIS — F329 Major depressive disorder, single episode, unspecified: Secondary | ICD-10-CM | POA: Diagnosis not present

## 2015-08-01 DIAGNOSIS — H9202 Otalgia, left ear: Secondary | ICD-10-CM | POA: Diagnosis present

## 2015-08-01 DIAGNOSIS — K148 Other diseases of tongue: Secondary | ICD-10-CM

## 2015-08-01 DIAGNOSIS — K089 Disorder of teeth and supporting structures, unspecified: Secondary | ICD-10-CM | POA: Insufficient documentation

## 2015-08-01 DIAGNOSIS — J449 Chronic obstructive pulmonary disease, unspecified: Secondary | ICD-10-CM | POA: Insufficient documentation

## 2015-08-01 LAB — RAPID STREP SCREEN (MED CTR MEBANE ONLY): STREPTOCOCCUS, GROUP A SCREEN (DIRECT): NEGATIVE

## 2015-08-01 MED ORDER — PENICILLIN V POTASSIUM 500 MG PO TABS: 500.0000 mg | ORAL_TABLET | Freq: Four times a day (QID) | ORAL | Status: AC

## 2015-08-01 NOTE — ED Notes (Signed)
Pt given d/c instructions as per chart. Verbalizes understanding. No questions. Rx x 1 

## 2015-08-01 NOTE — ED Notes (Signed)
PA at bedside.

## 2015-08-01 NOTE — ED Provider Notes (Signed)
CSN: [id number]     Arrival date & time 08/01/15  2048 History   By signing my name below, I, [name], attest that this documentation has been prepared under the direction and in the presence of Sealed Air CorporationHeather Winnell Bento, PA-C. Electronically Signed: Evon Slackerrance Cole, ED Scribe. 08/01/2015. 9:34 PM.     Chief Complaint  Patient presents with  . Otalgia    The history is provided by the patient. No language interpreter was used.   HPI Comments: [name] is a 35 y.o. female who presents to the Emergency Department complaining of worsening  sharp left ear pain onset 2 weeks prior. Pt states that the pain radiates down to the left sided of her throat. Pt states that she has also tried Advil with no relief. Pt states that the pain is worse when swallowing. Pt also reports she has a "bad" tooth on the left side. She also reports that she has had an ulcerated lesion on the lateral aspect of her tongue for the past week.  She is concerned about this because she used to smoke.  Denies ear drainage, fever, nausea or vomiting.  Denies difficulty swallowing.  Past Medical History  Diagnosis Date  . COPD (chronic obstructive pulmonary disease) (HCC)   . Asthma   . Anxiety   . Emphysema   . Depression   . Bipolar 1 disorder (HCC)   . Arthritis   . Pneumonia   . Rib fracture    Past Surgical History  Procedure Laterality Date  . Cholecystectomy    . Tubal ligation     History reviewed. No pertinent family history. Social History  Substance Use Topics  . Smoking status: Former Smoker -- 0.00 packs/day    Quit date: 05/07/2011  . Smokeless tobacco: None  . Alcohol Use: No   OB History    Gravida Para Term Preterm AB TAB SAB Ectopic Multiple Living   6 3 3  0 3 0 3 0 0 3     Review of Systems A complete 10 system review of systems was obtained and all systems are negative except as noted in the HPI and PMH.     Allergies  Iohexol  Home Medications   Prior to Admission  medications   Medication Sig Start Date End Date Taking? Authorizing Provider  albuterol (PROVENTIL HFA;VENTOLIN HFA) 108 (90 BASE) MCG/ACT inhaler Inhale 2 puffs into the lungs every 6 (six) hours as needed. For shortness of breath.     Historical Provider, MD  beclomethasone (QVAR) 80 MCG/ACT inhaler Inhale 2 puffs into the lungs 2 (two) times daily.    Historical Provider, MD  clonazePAM (KLONOPIN) 0.5 MG tablet Take 2 tablets (1 mg total) by mouth 2 (two) times daily as needed for anxiety (take 1mg  every am and .5mg  every pm ). 01/29/11 05/06/12  Jethro BastosAnne W Crawford, NP  clonazePAM (KLONOPIN) 1 MG tablet Take 1 mg by mouth 2 (two) times daily as needed. For anxiety.    Historical Provider, MD  escitalopram (LEXAPRO) 20 MG tablet Take 20 mg by mouth daily.      Historical Provider, MD  gabapentin (NEURONTIN) 300 MG capsule Take 300 mg by mouth 3 (three) times daily.      Historical Provider, MD  HYDROcodone-homatropine (HYCODAN) 5-1.5 MG/5ML syrup Take 5 mLs by mouth every 6 (six) hours as needed for cough. Patient not taking: Reported on 01/22/2015 09/01/14   Marissa GemmaElizabeth C Westfall, PA-C  ibuprofen (ADVIL,MOTRIN) 800 MG tablet Take 1 tablet (800 mg  total) by mouth 3 (three) times daily. 05/31/13   Elwin Mocha, MD  lurasidone (LATUDA) 80 MG TABS tablet Take 80 mg by mouth daily with breakfast.    Historical Provider, MD  methocarbamol (ROBAXIN) 500 MG tablet Take 1 tablet (500 mg total) by mouth 2 (two) times daily as needed for muscle spasms. 09/01/14   Marissa Gemma, PA-C  traZODone (DESYREL) 150 MG tablet Take 150 mg by mouth at bedtime.    Historical Provider, MD   BP 148/97 mmHg  Pulse 103  Temp(Src) 99.4 F (37.4 C) (Oral)  Resp 18  Ht  (1.702 m)  Wt 260 lb (117.935 kg)  BMI 40.71 kg/m2  SpO2 97%  LMP 07/03/2015   Physical Exam  Constitutional: She is oriented to person, place, and time. She appears well-developed and well-nourished. No distress.  HENT:  Head: Normocephalic and  atraumatic.  Right Ear: Tympanic membrane and ear canal normal.  Left Ear: Tympanic membrane and ear canal normal.  Mouth/Throat: Uvula is midline. No trismus in the jaw. No uvula swelling. No oropharyngeal exudate.  TTP of left lower gingiva, no obvious dental abscess.  Ulceration to the lateral aspect of the tongue on both left and right side. Bilateral tonsillar enlargement. No sublingual tenderness or swelling  Eyes: Conjunctivae and EOM are normal.  Neck: Neck supple.  Cardiovascular: Normal rate and regular rhythm.   Pulmonary/Chest: Effort normal and breath sounds normal. No respiratory distress. She has no wheezes. She has no rales.  Musculoskeletal: Normal range of motion.  Lymphadenopathy:       Head (right side): No submental and no submandibular adenopathy present.       Head (left side): No submental and no submandibular adenopathy present.  Neurological: She is alert and oriented to person, place, and time.  Skin: Skin is warm and dry.  Psychiatric: She has a normal mood and affect. Her behavior is normal.  Nursing note and vitals reviewed.   ED Course  Procedures (including critical care time) DIAGNOSTIC STUDIES: Oxygen Saturation is 97% on RA, normal by my interpretation.    COORDINATION OF CARE: 9:33 PM-Discussed treatment plan which includes strep screen with pt at bedside and pt agreed to plan.     Labs Review Labs Reviewed  RAPID STREP SCREEN (NOT AT [facility])  CULTURE, GROUP A STREP [facility])    Imaging Review No results found.    EKG Interpretation None      MDM   Final diagnoses:  None   Patient presents today with pain of her left gingiva and also her left ear.  No signs of ear infection.  She does have dental decay of the left lower tooth and tenderness of the left lower gingiva. No gross abscess.  Exam unconcerning for Ludwig's angina or spread of infection.  Will treat with penicillin.  Urged patient to follow-up with dentist.  Patient also has  an ulcerated lesion to the lateral aspect of the tongue in one week.  She has a PMH significant for smoking and is concerned about this.  Patient given referral to ENT and explained the importance of follow up due to concern for possible Cancer.  Stable for discharge.  Return precautions given.   I personally performed the services described in this documentation, which was scribed in my presence. The recorded information has been reviewed and is accurate.     Santiago Glad, PA-C 08/03/15 0004  Doug Sou, MD 08/03/15 [id number]

## 2015-08-01 NOTE — ED Notes (Signed)
Pt in c/o L ear pain onset 2 weeks ago, radiates to jaw and makes throat hurt. Ambulatory in NAD.

## 2015-08-01 NOTE — ED Notes (Signed)
Pt c/o left ear pain x 2 weeks. Sore throat. Blisters on tongue which make it difficult for her to eat. Also states she has a bad tooth.

## 2015-08-04 LAB — CULTURE, GROUP A STREP (THRC)

## 2015-11-09 ENCOUNTER — Ambulatory Visit (HOSPITAL_BASED_OUTPATIENT_CLINIC_OR_DEPARTMENT_OTHER): Payer: Medicare Other | Attending: Internal Medicine

## 2016-02-14 ENCOUNTER — Ambulatory Visit (HOSPITAL_BASED_OUTPATIENT_CLINIC_OR_DEPARTMENT_OTHER): Payer: Medicare Other | Attending: Internal Medicine

## 2016-02-29 ENCOUNTER — Ambulatory Visit: Payer: Medicare Other

## 2016-02-29 ENCOUNTER — Other Ambulatory Visit (HOSPITAL_COMMUNITY)
Admission: RE | Admit: 2016-02-29 | Discharge: 2016-02-29 | Disposition: A | Discharge status: Home / Self Care | Payer: Medicare Other | Source: Ambulatory Visit | Attending: Family Medicine | Admitting: Family Medicine

## 2016-02-29 DIAGNOSIS — Z113 Encounter for screening for infections with a predominantly sexual mode of transmission: Secondary | ICD-10-CM | POA: Diagnosis present

## 2016-02-29 DIAGNOSIS — Z202 Contact with and (suspected) exposure to infections with a predominantly sexual mode of transmission: Secondary | ICD-10-CM

## 2016-02-29 NOTE — Progress Notes (Signed)
Patient presented to office today to leave a clean catch urine specimen. Patient stated she was expose to std and wanted to make sure everything was ok. I explained to patient these results will take a couple days but we will call her back once we get her test results.

## 2016-03-01 LAB — GC/CHLAMYDIA PROBE AMP (~~LOC~~) NOT AT ARMC
Chlamydia: NEGATIVE
NEISSERIA GONORRHEA: NEGATIVE

## 2016-03-10 ENCOUNTER — Ambulatory Visit: Payer: Medicare Other

## 2016-03-10 ENCOUNTER — Other Ambulatory Visit (HOSPITAL_COMMUNITY)
Admission: RE | Admit: 2016-03-10 | Discharge: 2016-03-10 | Disposition: A | Discharge status: Home / Self Care | Payer: Medicare Other | Source: Ambulatory Visit | Attending: Obstetrics & Gynecology | Admitting: Obstetrics & Gynecology

## 2016-03-10 DIAGNOSIS — N898 Other specified noninflammatory disorders of vagina: Secondary | ICD-10-CM

## 2016-03-10 DIAGNOSIS — Z113 Encounter for screening for infections with a predominantly sexual mode of transmission: Secondary | ICD-10-CM | POA: Insufficient documentation

## 2016-03-11 LAB — GC/CHLAMYDIA PROBE AMP (~~LOC~~) NOT AT ARMC
Chlamydia: NEGATIVE
NEISSERIA GONORRHEA: NEGATIVE

## 2016-04-04 NOTE — Progress Notes (Signed)
Pt called and requesting her test results.  I informed pt that her GC/C resulted negative on both 02/29/16 and 03/10/16.  Pt asked why she did not have everything tested.  I explained to the pt that I could not explain why she did not get everything tested but that I would advise her to come in and do another self swab, we will run her urine for possible UTI without doing another GC/C.  Pt stated understanding with no further questions.

## 2016-05-31 ENCOUNTER — Telehealth: Payer: Self-pay | Admitting: *Deleted

## 2016-05-31 NOTE — Telephone Encounter (Signed)
Pt called and asked to speak with a nurse.  She stated that she would like an appt to be checked for all STI's (including GC/Chlamydia, Hep B, Hep C, HIV, RPR) as well as vaginal yeast, BV and UTI.  She reports having burning with urination and vaginal discharge with odor. Pt was advised that she can come in tomorrow @ 1535 and she agreed to appt.

## 2016-06-01 ENCOUNTER — Ambulatory Visit: Payer: Medicare Other | Admitting: *Deleted

## 2016-06-01 ENCOUNTER — Other Ambulatory Visit (HOSPITAL_COMMUNITY)
Admission: RE | Admit: 2016-06-01 | Discharge: 2016-06-01 | Disposition: A | Discharge status: Home / Self Care | Payer: Medicare Other | Source: Ambulatory Visit | Attending: Family Medicine | Admitting: Family Medicine

## 2016-06-01 DIAGNOSIS — N898 Other specified noninflammatory disorders of vagina: Secondary | ICD-10-CM | POA: Insufficient documentation

## 2016-06-01 DIAGNOSIS — Z202 Contact with and (suspected) exposure to infections with a predominantly sexual mode of transmission: Secondary | ICD-10-CM | POA: Insufficient documentation

## 2016-06-01 DIAGNOSIS — R300 Dysuria: Secondary | ICD-10-CM

## 2016-06-01 NOTE — Progress Notes (Signed)
Pt arrived for appt as scheduled. She reports vaginal discharge and burning with urination as previously stated yesterday. She states that she has reason to believe that she has been exposed to [employer]'s. Tests performed as requested.

## 2016-06-02 LAB — CERVICOVAGINAL ANCILLARY ONLY
BACTERIAL VAGINITIS: POSITIVE — AB
CANDIDA VAGINITIS: NEGATIVE
CHLAMYDIA, DNA PROBE: NEGATIVE
NEISSERIA GONORRHEA: NEGATIVE
TRICH (WINDOWPATH): POSITIVE — AB

## 2016-06-02 LAB — RPR: RPR Ser Ql: NONREACTIVE

## 2016-06-02 LAB — HIV ANTIBODY (ROUTINE TESTING W REFLEX): HIV SCREEN 4TH GENERATION: NONREACTIVE

## 2016-06-02 LAB — HEPATITIS C ANTIBODY: Hep C Virus Ab: <0.1 s/co ratio (ref 0.0–0.9)

## 2016-06-02 LAB — HEPATITIS B SURFACE ANTIGEN: Hepatitis B Surface Ag: NEGATIVE

## 2016-06-03 LAB — URINE CULTURE

## 2016-06-05 ENCOUNTER — Other Ambulatory Visit: Payer: Self-pay | Admitting: Family Medicine

## 2016-06-05 DIAGNOSIS — A599 Trichomoniasis, unspecified: Secondary | ICD-10-CM

## 2016-06-05 MED ORDER — METRONIDAZOLE 500 MG PO TABS: ORAL_TABLET | ORAL | 0 refills | Status: DC

## 2016-06-05 NOTE — Progress Notes (Signed)
Sent in rx for metronidazole based on trich present on wet prep.

## 2016-06-06 ENCOUNTER — Telehealth: Payer: Self-pay | Admitting: *Deleted

## 2016-06-06 DIAGNOSIS — A599 Trichomoniasis, unspecified: Secondary | ICD-10-CM

## 2016-06-06 MED ORDER — METRONIDAZOLE 500 MG PO TABS: ORAL_TABLET | ORAL | 0 refills | Status: DC

## 2016-06-06 NOTE — Telephone Encounter (Signed)
Called pt, no answer, left message to call the office.  

## 2016-06-06 NOTE — Telephone Encounter (Signed)
-----   Message from Kimberly Niles Newton, MD sent at 06/05/2016  9:48 PM EDT ----- Patient with Trichomonas. I have sent in Rx for metronidazole 2g POx1. Please call patient to let her know of diagnosis and treatment plan. 

## 2016-06-06 NOTE — Telephone Encounter (Signed)
-----   Message from Federico FlakeKimberly Niles Newton, MD sent at 06/05/2016  9:48 PM EDT ----- Patient with Trichomonas. I have sent in Rx for metronidazole 2g POx1. Please call patient to let her know of diagnosis and treatment plan.

## 2016-06-06 NOTE — Telephone Encounter (Signed)
Informed pt of results and treatment, instructed to refrain from sexual contact until partner was treated.  Pt acknowledged instructions.

## 2016-07-04 ENCOUNTER — Emergency Department (HOSPITAL_BASED_OUTPATIENT_CLINIC_OR_DEPARTMENT_OTHER): Payer: Medicare Other

## 2016-07-04 ENCOUNTER — Inpatient Hospital Stay (HOSPITAL_BASED_OUTPATIENT_CLINIC_OR_DEPARTMENT_OTHER)
Admission: EM | Admit: 2016-07-04 | Discharge: 2016-07-05 | DRG: 190 | Discharge status: Against Medical Advice | Payer: Medicare Other | Attending: Internal Medicine | Admitting: Internal Medicine

## 2016-07-04 ENCOUNTER — Encounter (HOSPITAL_BASED_OUTPATIENT_CLINIC_OR_DEPARTMENT_OTHER): Payer: Self-pay | Admitting: *Deleted

## 2016-07-04 DIAGNOSIS — J441 Chronic obstructive pulmonary disease with (acute) exacerbation: Secondary | ICD-10-CM | POA: Diagnosis present

## 2016-07-04 DIAGNOSIS — R0902 Hypoxemia: Secondary | ICD-10-CM

## 2016-07-04 DIAGNOSIS — Z9981 Dependence on supplemental oxygen: Secondary | ICD-10-CM | POA: Diagnosis not present

## 2016-07-04 DIAGNOSIS — Z6841 Body Mass Index (BMI) 40.0 and over, adult: Secondary | ICD-10-CM | POA: Diagnosis not present

## 2016-07-04 DIAGNOSIS — J9621 Acute and chronic respiratory failure with hypoxia: Secondary | ICD-10-CM

## 2016-07-04 DIAGNOSIS — I10 Essential (primary) hypertension: Secondary | ICD-10-CM | POA: Diagnosis present

## 2016-07-04 DIAGNOSIS — R6510 Systemic inflammatory response syndrome (SIRS) of non-infectious origin without acute organ dysfunction: Secondary | ICD-10-CM | POA: Diagnosis present

## 2016-07-04 DIAGNOSIS — E876 Hypokalemia: Secondary | ICD-10-CM | POA: Diagnosis present

## 2016-07-04 DIAGNOSIS — Z888 Allergy status to other drugs, medicaments and biological substances status: Secondary | ICD-10-CM

## 2016-07-04 DIAGNOSIS — Z7952 Long term (current) use of systemic steroids: Secondary | ICD-10-CM

## 2016-07-04 DIAGNOSIS — F319 Bipolar disorder, unspecified: Secondary | ICD-10-CM | POA: Diagnosis present

## 2016-07-04 DIAGNOSIS — J449 Chronic obstructive pulmonary disease, unspecified: Secondary | ICD-10-CM | POA: Diagnosis present

## 2016-07-04 DIAGNOSIS — Z79899 Other long term (current) drug therapy: Secondary | ICD-10-CM

## 2016-07-04 DIAGNOSIS — A419 Sepsis, unspecified organism: Secondary | ICD-10-CM

## 2016-07-04 DIAGNOSIS — J9601 Acute respiratory failure with hypoxia: Secondary | ICD-10-CM | POA: Diagnosis present

## 2016-07-04 LAB — INFLUENZA PANEL BY PCR (TYPE A & B)
INFLAPCR: NEGATIVE
INFLBPCR: NEGATIVE

## 2016-07-04 LAB — CBC WITH DIFFERENTIAL/PLATELET
Basophils Absolute: 0 10*3/uL (ref 0.0–0.1)
Basophils Relative: 0 %
Eosinophils Absolute: 0 10*3/uL (ref 0.0–0.7)
Eosinophils Relative: 0 %
HCT: 40.7 % (ref 36.0–46.0)
Hemoglobin: 13.6 g/dL (ref 12.0–15.0)
Lymphocytes Relative: 12 %
Lymphs Abs: 1.4 10*3/uL (ref 0.7–4.0)
MCH: 29.2 pg (ref 26.0–34.0)
MCHC: 33.4 g/dL (ref 30.0–36.0)
MCV: 87.5 fL (ref 78.0–100.0)
Monocytes Absolute: 0.9 10*3/uL (ref 0.1–1.0)
Monocytes Relative: 8 %
Neutro Abs: 9.1 10*3/uL — ABNORMAL HIGH (ref 1.7–7.7)
Neutrophils Relative %: 80 %
Platelets: 298 10*3/uL (ref 150–400)
RBC: 4.65 MIL/uL (ref 3.87–5.11)
RDW: 15.1 % (ref 11.5–15.5)
WBC: 11.5 10*3/uL — ABNORMAL HIGH (ref 4.0–10.5)

## 2016-07-04 LAB — BASIC METABOLIC PANEL
Anion gap: 10 (ref 5–15)
BUN: 8 mg/dL (ref 6–20)
CO2: 23 mmol/L (ref 22–32)
Calcium: 7.8 mg/dL — ABNORMAL LOW (ref 8.9–10.3)
Chloride: 100 mmol/L — ABNORMAL LOW (ref 101–111)
Creatinine, Ser: 0.59 mg/dL (ref 0.44–1.00)
GFR calc Af Amer: >60 mL/min (ref >60)
GFR calc non Af Amer: >60 mL/min (ref >60)
Glucose, Bld: 157 mg/dL — ABNORMAL HIGH (ref 65–99)
Potassium: 3 mmol/L — ABNORMAL LOW (ref 3.5–5.1)
Sodium: 133 mmol/L — ABNORMAL LOW (ref 135–145)

## 2016-07-04 LAB — BRAIN NATRIURETIC PEPTIDE: B Natriuretic Peptide: 27.3 pg/mL (ref 0.0–100.0)

## 2016-07-04 LAB — MRSA PCR SCREENING: MRSA BY PCR: NEGATIVE

## 2016-07-04 LAB — MAGNESIUM: MAGNESIUM: 1.8 mg/dL (ref 1.7–2.4)

## 2016-07-04 LAB — D-DIMER, QUANTITATIVE: D-Dimer, Quant: 3.66 ug/mL-FEU — ABNORMAL HIGH (ref 0.00–0.50)

## 2016-07-04 LAB — I-STAT CG4 LACTIC ACID, ED: Lactic Acid, Venous: 1.6 mmol/L (ref 0.5–1.9)

## 2016-07-04 MED ORDER — BUDESONIDE 0.25 MG/2ML IN SUSP
0.2500 mg | Freq: Two times a day (BID) | RESPIRATORY_TRACT | Status: DC
  Administered 2016-07-04 – 2016-07-05 (×2): 0.25 mg via RESPIRATORY_TRACT
  Filled 2016-07-04 (×2): qty 2

## 2016-07-04 MED ORDER — AZITHROMYCIN 500 MG IV SOLR
INTRAVENOUS | Status: AC
  Filled 2016-07-04: qty 500

## 2016-07-04 MED ORDER — LURASIDONE HCL 40 MG PO TABS
80.0000 mg | ORAL_TABLET | Freq: Every day | ORAL | Status: DC
Start: 2016-07-05 — End: 2016-07-05
  Filled 2016-07-04: qty 2

## 2016-07-04 MED ORDER — ORAL CARE MOUTH RINSE: 15.0000 mL | Freq: Two times a day (BID) | OROMUCOSAL | Status: DC

## 2016-07-04 MED ORDER — ONDANSETRON HCL 4 MG/2ML IJ SOLN
4.0000 mg | Freq: Four times a day (QID) | INTRAMUSCULAR | Status: DC | PRN
Start: 2016-07-04 — End: 2016-07-05

## 2016-07-04 MED ORDER — GABAPENTIN 300 MG PO CAPS
300.0000 mg | ORAL_CAPSULE | Freq: Three times a day (TID) | ORAL | Status: DC
  Administered 2016-07-04: 300 mg via ORAL
  Filled 2016-07-04: qty 1

## 2016-07-04 MED ORDER — SODIUM CHLORIDE 0.9 % IV BOLUS (SEPSIS)
1000.0000 mL | Freq: Once | INTRAVENOUS | Status: AC
  Administered 2016-07-04: 1000 mL via INTRAVENOUS

## 2016-07-04 MED ORDER — METHYLPREDNISOLONE SODIUM SUCC 125 MG IJ SOLR
60.0000 mg | Freq: Four times a day (QID) | INTRAMUSCULAR | Status: DC
  Administered 2016-07-04 – 2016-07-05 (×3): 60 mg via INTRAVENOUS
  Filled 2016-07-04 (×3): qty 2

## 2016-07-04 MED ORDER — CHLORHEXIDINE GLUCONATE 0.12 % MT SOLN
15.0000 mL | Freq: Two times a day (BID) | OROMUCOSAL | Status: DC
  Administered 2016-07-04: 15 mL via OROMUCOSAL
  Filled 2016-07-04: qty 15

## 2016-07-04 MED ORDER — ARFORMOTEROL TARTRATE 15 MCG/2ML IN NEBU
15.0000 ug | INHALATION_SOLUTION | Freq: Two times a day (BID) | RESPIRATORY_TRACT | Status: DC
  Administered 2016-07-04 – 2016-07-05 (×2): 15 ug via RESPIRATORY_TRACT
  Filled 2016-07-04 (×2): qty 2

## 2016-07-04 MED ORDER — ALBUTEROL SULFATE (2.5 MG/3ML) 0.083% IN NEBU
5.0000 mg | INHALATION_SOLUTION | Freq: Once | RESPIRATORY_TRACT | Status: AC
  Administered 2016-07-04: 5 mg via RESPIRATORY_TRACT
  Filled 2016-07-04: qty 6

## 2016-07-04 MED ORDER — ESCITALOPRAM OXALATE 20 MG PO TABS: 20.0000 mg | ORAL_TABLET | Freq: Every day | ORAL | Status: DC

## 2016-07-04 MED ORDER — DEXTROSE 5 % IV SOLN: 500.0000 mg | INTRAVENOUS | Status: DC

## 2016-07-04 MED ORDER — LORAZEPAM 2 MG/ML IJ SOLN
1.0000 mg | Freq: Once | INTRAMUSCULAR | Status: AC
  Administered 2016-07-04: 1 mg via INTRAVENOUS
  Filled 2016-07-04 (×2): qty 1

## 2016-07-04 MED ORDER — ENOXAPARIN SODIUM 100 MG/ML ~~LOC~~ SOLN
85.0000 mg | SUBCUTANEOUS | Status: DC
  Administered 2016-07-04: 85 mg via SUBCUTANEOUS
  Filled 2016-07-04: qty 0.85

## 2016-07-04 MED ORDER — ACETAMINOPHEN 325 MG PO TABS
650.0000 mg | ORAL_TABLET | Freq: Once | ORAL | Status: AC
Start: 2016-07-04 — End: 2016-07-04
  Administered 2016-07-04: 650 mg via ORAL
  Filled 2016-07-04: qty 2

## 2016-07-04 MED ORDER — IPRATROPIUM-ALBUTEROL 0.5-2.5 (3) MG/3ML IN SOLN
3.0000 mL | Freq: Four times a day (QID) | RESPIRATORY_TRACT | Status: DC
  Administered 2016-07-04: 3 mL via RESPIRATORY_TRACT
  Filled 2016-07-04: qty 3

## 2016-07-04 MED ORDER — ACETAMINOPHEN 325 MG PO TABS
650.0000 mg | ORAL_TABLET | Freq: Four times a day (QID) | ORAL | Status: DC | PRN
Start: 2016-07-04 — End: 2016-07-05
  Administered 2016-07-05: 650 mg via ORAL
  Filled 2016-07-04: qty 2

## 2016-07-04 MED ORDER — NICOTINE 21 MG/24HR TD PT24
21.0000 mg | MEDICATED_PATCH | Freq: Every day | TRANSDERMAL | Status: DC
  Administered 2016-07-04 – 2016-07-05 (×2): 21 mg via TRANSDERMAL
  Filled 2016-07-04 (×2): qty 1

## 2016-07-04 MED ORDER — ALBUTEROL SULFATE (2.5 MG/3ML) 0.083% IN NEBU: 2.5000 mg | INHALATION_SOLUTION | RESPIRATORY_TRACT | Status: DC | PRN

## 2016-07-04 MED ORDER — CLONAZEPAM 1 MG PO TABS: 1.0000 mg | ORAL_TABLET | Freq: Two times a day (BID) | ORAL | Status: DC | PRN

## 2016-07-04 MED ORDER — METHYLPREDNISOLONE SODIUM SUCC 125 MG IJ SOLR
125.0000 mg | Freq: Once | INTRAMUSCULAR | Status: AC
  Administered 2016-07-04: 125 mg via INTRAVENOUS
  Filled 2016-07-04: qty 2

## 2016-07-04 MED ORDER — DEXTROSE 5 % IV SOLN
1.0000 g | Freq: Once | INTRAVENOUS | Status: AC
  Administered 2016-07-04: 1 g via INTRAVENOUS
  Filled 2016-07-04: qty 10

## 2016-07-04 MED ORDER — ONDANSETRON HCL 4 MG/2ML IJ SOLN
4.0000 mg | Freq: Once | INTRAMUSCULAR | Status: AC
  Administered 2016-07-04: 4 mg via INTRAVENOUS
  Filled 2016-07-04: qty 2

## 2016-07-04 MED ORDER — SODIUM CHLORIDE 0.9% FLUSH
3.0000 mL | Freq: Two times a day (BID) | INTRAVENOUS | Status: DC
  Administered 2016-07-04: 3 mL via INTRAVENOUS

## 2016-07-04 MED ORDER — POTASSIUM CHLORIDE CRYS ER 20 MEQ PO TBCR
60.0000 meq | EXTENDED_RELEASE_TABLET | Freq: Once | ORAL | Status: AC
  Administered 2016-07-04: 60 meq via ORAL
  Filled 2016-07-04: qty 3

## 2016-07-04 MED ORDER — IPRATROPIUM-ALBUTEROL 0.5-2.5 (3) MG/3ML IN SOLN
3.0000 mL | Freq: Three times a day (TID) | RESPIRATORY_TRACT | Status: DC
  Administered 2016-07-05: 3 mL via RESPIRATORY_TRACT
  Filled 2016-07-04: qty 3

## 2016-07-04 MED ORDER — DEXTROSE 5 % IV SOLN
500.0000 mg | Freq: Once | INTRAVENOUS | Status: AC
  Administered 2016-07-04: 500 mg via INTRAVENOUS

## 2016-07-04 MED ORDER — ACETAMINOPHEN 650 MG RE SUPP: 650.0000 mg | Freq: Four times a day (QID) | RECTAL | Status: DC | PRN

## 2016-07-04 MED ORDER — DEXTROSE 5 % IV SOLN
2.0000 g | INTRAVENOUS | Status: DC
  Filled 2016-07-04: qty 2

## 2016-07-04 NOTE — ED Notes (Signed)
RT at bedside to start pt on bipap

## 2016-07-04 NOTE — Progress Notes (Signed)
Smoker, COPD on home oxygen, resp distress, cough, hypoxic in high 80's. Already given azitho and rocephin. Transfer to SDU, pt on BiPAP.  Marissa Passeylma Cole [facility] [phone]

## 2016-07-04 NOTE — ED Notes (Addendum)
Admission process discussed with patient and family. Room assignment given to family. Daughter Colin MuldersBrianna states family has all pt belongings except pants pt is wearing

## 2016-07-04 NOTE — Progress Notes (Signed)
PHARMACY - ENOXAPARIN  Patient ordered Enoxaparin 40mg  sq q24h for VTE prophylaxis and pharmacy requested to adjust dose as needed  Height : 67 inches Weight: 172.4 kg (confirmed with RN) BMI = 59 CrCl > 120 ml/min  Assessment:  Will adjust Enoxaparin per manufacturer recommendations based on CrCl > 30 ml/min and BMI > 30  Plan:  Change Enoxaparin to 85mg  sq q24h (0.5 mg/kg/q24h)  Terrilee FilesLeann Ilka Lovick, PharmD

## 2016-07-04 NOTE — Progress Notes (Signed)
[facility] to Room [facility]29 admissions notified.  Patient is on Bi-Pap, A/O X4. States that she feels a little bit better since the Bi-Pap.

## 2016-07-04 NOTE — ED Triage Notes (Signed)
Pt to room 1, family reports pt with n/v x 2 days with sob and fevers. Pt resp are rapid and regular, encouraged to slow resp rate. Pt has to be encouraged to answer questions, states "my head hurts so bad." dr. Juleen Chinakohut alerted to bedside, ekg and iv access obtained during triage.

## 2016-07-04 NOTE — H&P (Addendum)
History and Physical    Marissa CaperMelissa L Canaday ZOX:[id number] DOB: 03/29/1980 DOA: 07/04/2016  PCP: Rometta EmeryGarba, Mohammad L, MD Patient coming from: Home.   I have personally briefly reviewed patient's old medical [facility]  Chief Complaint: sob and fever.   HPI: [name] is a [age] y.o. female with medical history significant of COPD, hypertension, bipolar disorder, anxiety [facility] sob , fever for a couple of days , associated with chills, nausea, vomiting and watery diarrhea for 2 days. Pt denies chest pain. No syncope. No sick contacts. No travel. Pt continues to smoke ,. She was recently scheduled to get sleep study, didn't get a chance to get it done.  [facility] , she was found to be hypoxic, was put on BIPAP and [facility] evaluation. Blood work revealed mild leukocytosis, sodium of 133 and potassium of 3. [facility] evaluation of acute respiratory failure with hypoxia. She was empirically started on antibiotics.   Review of Systems: As per HPI otherwise 10 point review of systems negative.    Past Medical History:  Diagnosis Date  . Anxiety   . Arthritis   . Asthma   . Bipolar 1 disorder (HCC)   . COPD (chronic obstructive pulmonary disease) (HCC)   . Depression   . Emphysema   . Pneumonia   . Rib fracture     Past Surgical History:  Procedure Laterality Date  . CHOLECYSTECTOMY    . TUBAL LIGATION       reports that she quit smoking about 5 years ago. She smoked 0.00 packs per day. She has never used smokeless tobacco. She reports that she does not drink alcohol or use drugs.  Allergies  Allergen Reactions  . Iohexol Cough    History reviewed. No pertinent family history. Reviewed.   Prior to Admission medications   Medication Sig Start Date End Date Taking? Authorizing Provider  albuterol (PROVENTIL HFA;VENTOLIN HFA) 108 (90 BASE) MCG/ACT inhaler Inhale 2 puffs  into the lungs every 6 (six) hours as needed. For shortness of breath.    Yes [provider]  beclomethasone (QVAR) 80 MCG/ACT inhaler Inhale 2 puffs into the lungs 2 (two) times daily.   Yes [provider]  clonazePAM (KLONOPIN) 0.5 MG tablet Take 2 tablets (1 mg total) by mouth 2 (two) times daily as needed for anxiety (take 1mg  every am and .5mg  every pm ). 01/29/11 07/04/16 Yes Jethro Bastosrawford, Anne W, NP  escitalopram (LEXAPRO) 20 MG tablet Take 20 mg by mouth daily.     Yes [provider]  gabapentin (NEURONTIN) 300 MG capsule Take 300 mg by mouth 3 (three) times daily.     Yes [provider]  lurasidone (LATUDA) 80 MG TABS tablet Take 80 mg by mouth daily with breakfast.   Yes [provider]  methocarbamol (ROBAXIN) 500 MG tablet Take 1 tablet (500 mg total) by mouth 2 (two) times daily as needed for muscle spasms. 09/01/14  Yes Westfall, Elizabeth C, PA-C  HYDROcodone-homatropine (HYCODAN) 5-1.5 MG/5ML syrup Take 5 mLs by mouth every 6 (six) hours as needed for cough. Patient not taking: Reported on 01/22/2015 09/01/14   Mady GemmaWestfall, Elizabeth C, PA-C  ibuprofen (ADVIL,MOTRIN) 800 MG tablet Take 1 tablet (800 mg total) by mouth 3 (three) times daily. Patient not taking: Reported on 07/04/2016 05/31/13   Elwin MochaWalden, Blair, MD  metroNIDAZOLE (FLAGYL) 500 MG tablet Take two  tablets by mouth twice a day, for one day.  Or you can take all four tablets at once if you can tolerate it. Patient not taking: Reported on 07/04/2016 06/06/16   Federico Flake, MD    Physical Exam: Vitals:   07/04/16 1403 07/04/16 1430 07/04/16 1507 07/04/16 1718  BP: 112/66 109/73 (!) 145/88   Pulse: (!) 111 (!) 104 (!) 112 (!) 108  Resp: 20 (!) 21 19 (!) 24  Temp:      TempSrc:      SpO2: 97% 92% 93% 97%  Weight:      Height:        Constitutional:  IN distress on BIPAP.  Vitals:   07/04/16 1403 07/04/16 1430 07/04/16 1507 07/04/16 1718  BP: 112/66 109/73 (!) 145/88   Pulse:  (!) 111 (!) 104 (!) 112 (!) 108  Resp: 20 (!) 21 19 (!) 24  Temp:      TempSrc:      SpO2: 97% 92% 93% 97%  Weight:      Height:       Eyes: PERRL, lids and conjunctivae normal ENMT: Mucous membranes are dry. Posterior pharynx clear of any exudate or lesions.Normal dentition.  Neck: thick, thyromegaly cannot be appreciated.   Respiratory: accesory resp muscle use, tachypnea, diminished air entry at bases, scattered wheezing.  Cardiovascular: tachycardic, rhythm murmurs / rubs / gallops. No extremity edema. 2+ pedal pulses. No carotid bruits.  Abdomen: no tenderness, no masses palpated. No hepatosplenomegaly. Bowel sounds positive.  Musculoskeletal: no clubbing / cyanosis. No joint deformity upper and lower extremities. Good ROM, no contractures. Normal muscle tone.  Skin: no rashes, lesions, ulcers. No induration Neurologic: alert and oriented, able to answer simple questions. Able to move extremities. Marland Kitchen  Psychiatric lethargic, able to answer simple questions.   )  Labs on Admission: I have personally reviewed following labs and imaging studies  CBC:  Recent Labs Lab 07/04/16 1144  WBC 11.5*  NEUTROABS 9.1*  HGB 13.6  HCT 40.7  MCV 87.5  PLT 298   Basic Metabolic Panel:  Recent Labs Lab 07/04/16 1144  NA 133*  K 3.0*  CL 100*  CO2 23  GLUCOSE 157*  BUN 8  CREATININE 0.59  CALCIUM 7.8*   GFR: Estimated Creatinine Clearance: 162.5 mL/min (by C-G formula based on SCr of 0.59 mg/dL). Liver Function Tests: No results for input(s): AST, ALT, ALKPHOS, BILITOT, PROT, ALBUMIN in the last 168 hours. No results for input(s): LIPASE, AMYLASE in the last 168 hours. No results for input(s): AMMONIA in the last 168 hours. Coagulation Profile: No results for input(s): INR, PROTIME in the last 168 hours. Cardiac Enzymes: No results for input(s): CKTOTAL, CKMB, CKMBINDEX, TROPONINI in the last 168 hours. BNP (last 3 results) No results for input(s): PROBNP in the last 8760  hours. HbA1C: No results for input(s): HGBA1C in the last 72 hours. CBG: No results for input(s): GLUCAP in the last 168 hours. Lipid Profile: No results for input(s): CHOL, HDL, LDLCALC, TRIG, CHOLHDL, LDLDIRECT in the last 72 hours. Thyroid Function Tests: No results for input(s): TSH, T4TOTAL, FREET4, T3FREE, THYROIDAB in the last 72 hours. Anemia Panel: No results for input(s): VITAMINB12, FOLATE, FERRITIN, TIBC, IRON, RETICCTPCT in the last 72 hours. Urine analysis:    Component Value Date/Time   COLORURINE YELLOW 05/31/2013 1950   APPEARANCEUR CLOUDY (A) 05/31/2013 1950   LABSPEC >=1.030 01/22/2015 1523   PHURINE 5.5 01/22/2015 1523   GLUCOSEU NEGATIVE 01/22/2015 1523   HGBUR  NEGATIVE 01/22/2015 1523   BILIRUBINUR SMALL (A) 01/22/2015 1523   KETONESUR TRACE (A) 01/22/2015 1523   PROTEINUR 30 (A) 01/22/2015 1523   UROBILINOGEN 0.2 01/22/2015 1523   NITRITE NEGATIVE 01/22/2015 1523   LEUKOCYTESUR NEGATIVE 01/22/2015 1523    Radiological Exams on Admission: Dg Chest Portable 1 View  Result Date: 07/04/2016 CLINICAL DATA:  Cough and fever EXAM: PORTABLE CHEST 1 VIEW COMPARISON:  10/18/2014 FINDINGS: Cardiac shadow is mildly enlarged. Poor inspiratory effort is again noted and stable. No focal infiltrate or sizable effusion is seen. Old healed rib fractures are noted on the right. No acute abnormality is noted. IMPRESSION: No acute abnormality noted. Electronically Signed   By: Alcide Clever M.D.   On: 07/04/2016 12:10    EKG: Independently reviewed. Sinus tach withborderline t wave abnormalities.   Assessment/Plan Active Problems:   COPD (chronic obstructive pulmonary disease) (HCC)   Acute respiratory failure with hypoxia (HCC)    SIRS/ Acute respiratory failure with hypoxia secondary to acute COPD exacerbation requiring BIPAP and viral syndrome.  Admitted to step down for closer monitoring. Pt febrile, tachycardic, tachypnic, hypoxic , leukocytosis on admission. Get ABG  to get pco2 and bicarb levels.  Get D dimer, though suspicion for PE is low. If elevated get CT angio of the chest with contrast.  Lactic acid is normal.  She reports fever, chills, sob, nausea, vomiting and diarrhea.  Get influenza pcr, and gi pathogen for pcr. Blood cultures, . bipap as needed to keep sats greater than 95% She reports tobacco use, will order nicotine patch.  IV steroids, bronchodilators, and empiric IV antibiotics.    Bipolar disorder:  Resume home meds once able to take oral.    Hypokalemia: replaced. Get magnesium level.    Hypertension:  Slightly high, monitor, add prn hydralazine if needed.   Hyperglycemia: get A1C.   Morbidly obese.life style modification.  DVT prophylaxis: LOVENOX.  Code Status:  FULL CODE.  Family Communication: DISCUSSED WITH FAMILY AT BEDSIDE.  Disposition Plan: ADMIT TO STEP DOWN. 2 TO 3 DAYS IN PATIENT.  Consults called: NONE Admission status: Inpatient step down)   Johnny Gorter MD [facility] Pager [phone]  If 7PM-7AM, please contact night-coverage [email or web address] Password [facility]  07/04/2016, 5:28 PM

## 2016-07-04 NOTE — ED Notes (Signed)
Patient removed BiPAP.  Placed back on 4l/m [place]. HR 113., RR 24, SpO2 92%. RN aware

## 2016-07-04 NOTE — ED Provider Notes (Signed)
[facility] Provider Note   CSN: [id number] Arrival date & time: 07/04/16  1127  By signing my name below, I, Rosario Adie, attest that this documentation has been prepared under the direction and in the presence of Raeford Razor, MD. Electronically Signed: Rosario Adie, ED Scribe. 07/04/16. 11:44 AM.  History   Chief Complaint Chief Complaint  Patient presents with  . Shortness of Breath   The history is provided by the patient and a relative. No language interpreter was used.    HPI Comments: [name] is a 36 y.o. female with a PMHx of COPD, emphysema, HTN, and asthma, who presents to the Emergency Department complaining of persistent shortness of breath beginning several days ago, acutely worsening this morning prior to arrival. Per pt's family, pt has been experiencing nausea, vomiting, diarrhea, subjective fevers, cough productive of sputum, and shortness of breath worse from her basleine over the past several days; however, this morning her breathing acutely worsened. Pt notes associated chest pain, headache, and dizziness while in the ED. She is currently prescribed 3-5L O2 at home which has not been assisting with her shortness of breath. No h/o PE/DVT. No sick contacts with similar symptoms. Pt denies leg swelling, urgency, frequency, hematuria, dysuria, difficulty urinating, abdominal pain, or any other associated symptoms.   Past Medical History:  Diagnosis Date  . Anxiety   . Arthritis   . Asthma   . Bipolar 1 disorder (HCC)   . COPD (chronic obstructive pulmonary disease) (HCC)   . Depression   . Emphysema   . Pneumonia   . Rib fracture    There are no active problems to display for this patient.  Past Surgical History:  Procedure Laterality Date  . CHOLECYSTECTOMY    . TUBAL LIGATION     OB History    Gravida Para Term Preterm AB Living   6 3 3  0 3 3   SAB TAB Ectopic Multiple Live Births   3 0 0 0       Home  Medications    Prior to Admission medications   Medication Sig Start Date End Date Taking? Authorizing Provider  albuterol (PROVENTIL HFA;VENTOLIN HFA) 108 (90 BASE) MCG/ACT inhaler Inhale 2 puffs into the lungs every 6 (six) hours as needed. For shortness of breath.     [provider]  beclomethasone (QVAR) 80 MCG/ACT inhaler Inhale 2 puffs into the lungs 2 (two) times daily.    [provider]  clonazePAM (KLONOPIN) 0.5 MG tablet Take 2 tablets (1 mg total) by mouth 2 (two) times daily as needed for anxiety (take 1mg  every am and .5mg  every pm ). 01/29/11 05/06/12  Jethro Bastos, NP  clonazePAM (KLONOPIN) 1 MG tablet Take 1 mg by mouth 2 (two) times daily as needed. For anxiety.    [provider]  escitalopram (LEXAPRO) 20 MG tablet Take 20 mg by mouth daily.      [provider]  gabapentin (NEURONTIN) 300 MG capsule Take 300 mg by mouth 3 (three) times daily.      [provider]  HYDROcodone-homatropine (HYCODAN) 5-1.5 MG/5ML syrup Take 5 mLs by mouth every 6 (six) hours as needed for cough. Patient not taking: Reported on 01/22/2015 09/01/14   Mady Gemma, PA-C  ibuprofen (ADVIL,MOTRIN) 800 MG tablet Take 1 tablet (800 mg total) by mouth 3 (three) times daily. 05/31/13   Elwin Mocha, MD  lurasidone (LATUDA) 80 MG TABS tablet Take 80 mg by mouth  daily with breakfast.    [provider]  methocarbamol (ROBAXIN) 500 MG tablet Take 1 tablet (500 mg total) by mouth 2 (two) times daily as needed for muscle spasms. 09/01/14   Mady GemmaWestfall, Elizabeth C, PA-C  metroNIDAZOLE (FLAGYL) 500 MG tablet Take two tablets by mouth twice a day, for one day.  Or you can take all four tablets at once if you can tolerate it. 06/06/16   Federico FlakeNewton, Kimberly Niles, MD  traZODone (DESYREL) 150 MG tablet Take 150 mg by mouth at bedtime.    [provider]   Family History History reviewed. No pertinent family history.  Social History Social History    Substance Use Topics  . Smoking status: Former Smoker    Packs/day: 0.00    Quit date: 05/07/2011  . Smokeless tobacco: Never Used  . Alcohol use No   Allergies   Iohexol  Review of Systems Review of Systems  Respiratory: Positive for cough and shortness of breath.   Cardiovascular: Positive for chest pain. Negative for leg swelling.  Gastrointestinal: Positive for diarrhea, nausea and vomiting. Negative for abdominal pain.  Genitourinary: Negative for difficulty urinating, dysuria, frequency, hematuria and urgency.  Neurological: Positive for dizziness and headaches.  All other systems reviewed and are negative.  Physical Exam Updated Vital Signs Ht 5\' 7"  (1.702 m)   Wt (!) 380 lb (172.4 kg)   LMP 07/01/2016   BMI 59.52 kg/m   Physical Exam  Constitutional: She appears well-developed and well-nourished.  She appears tired.   HENT:  Head: Normocephalic.  Right Ear: External ear normal.  Left Ear: External ear normal.  Nose: Nose normal.  Mouth/Throat: Oropharynx is clear and moist.  Eyes: Conjunctivae are normal. Right eye exhibits no discharge. Left eye exhibits no discharge.  Neck: Normal range of motion.  Cardiovascular: Regular rhythm and normal heart sounds.  Tachycardia present.   No murmur heard. Pulmonary/Chest: Tachypnea noted. She has decreased breath sounds.  Breath sounds diminished bilaterally.   Abdominal: Soft. She exhibits no distension. There is no tenderness. There is no rebound and no guarding.  Musculoskeletal: Normal range of motion. She exhibits no edema or tenderness.  Neurological: She is alert. No cranial nerve deficit. Coordination normal.  Skin: Skin is warm and dry. No rash noted. No erythema. No pallor.  Psychiatric: She has a normal mood and affect. Her behavior is normal.  Nursing note and vitals reviewed.  ED Treatments / Results  DIAGNOSTIC STUDIES: Oxygen Saturation is 91% on 3L via [place], low by my interpretation.   COORDINATION  OF CARE: 11:43 AM-Discussed next steps with pt. Pt verbalized understanding and is agreeable with the plan.   Labs (all labs ordered are listed, but only abnormal results are displayed) Labs Reviewed  CBC WITH DIFFERENTIAL/PLATELET - Abnormal; Notable for the following:       Result Value   WBC 11.5 (*)    Neutro Abs 9.1 (*)    All other components within normal limits  BASIC METABOLIC PANEL - Abnormal; Notable for the following:    Sodium 133 (*)    Potassium 3.0 (*)    Chloride 100 (*)    Glucose, Bld 157 (*)    Calcium 7.8 (*)    All other components within normal limits  URINALYSIS, ROUTINE W REFLEX MICROSCOPIC  PREGNANCY, URINE  BRAIN NATRIURETIC PEPTIDE  I-STAT CG4 LACTIC ACID, ED    EKG  EKG Interpretation  Date/Time:  Monday July 04 2016 11:36:06 EDT Ventricular Rate:  128 PR  Interval:    QRS Duration: 101 QT Interval:  337 QTC Calculation: 492 R Axis:   -70 Text Interpretation:  Sinus tachycardia Left anterior fascicular block Low voltage, precordial leads Consider anterior infarct Borderline T abnormalities, inferior leads Confirmed by Juleen China  MD, Paxson Harrower [phone]) on 07/04/2016 12:10:43 PM      Radiology Dg Chest Portable 1 View  Result Date: 07/04/2016 CLINICAL DATA:  Cough and fever EXAM: PORTABLE CHEST 1 VIEW COMPARISON:  10/18/2014 FINDINGS: Cardiac shadow is mildly enlarged. Poor inspiratory effort is again noted and stable. No focal infiltrate or sizable effusion is seen. Old healed rib fractures are noted on the right. No acute abnormality is noted. IMPRESSION: No acute abnormality noted. Electronically Signed   By: Alcide Clever M.D.   On: 07/04/2016 12:10    Procedures Procedures   Medications Ordered in ED Medications  acetaminophen (TYLENOL) tablet 650 mg (not administered)  albuterol (PROVENTIL) (2.5 MG/3ML) 0.083% nebulizer solution 5 mg (not administered)  sodium chloride 0.9 % bolus 1,000 mL (not administered)  LORazepam (ATIVAN) injection 1 mg  (not administered)  ondansetron (ZOFRAN) injection 4 mg (not administered)    Initial Impression / Assessment and Plan / ED Course  I have reviewed the triage vital signs and the nursing notes.  Pertinent labs & imaging results that were available during my care of the patient were reviewed by me and considered in my medical decision making (see chart for details).     33yF with generalized weakness, fever and dyspnea. Preceded by 2d of v/d. SIRS criteria. I don't have a clear source currently. Empirically treated for possible pneumonia although her CXR is without a clear infiltrate. Recent v/d which has improved. Abdominal exam is benign. UA is pending. Rocephin would cover for UTI if that's the case. She was placed on bipap and much better on it. RR slowed into 20s, oxygenating better and looks more comfortable. Some scattered wheezing on exam. Solumedrol. Consider PE. She is tachy/mild hypoxemia but this is not unexpected with a fever of 103 and hx of COPD. She has a contrast allergy meaning she would need pretreated. I don't have a strong enough suspicion to empirically start heparin.   Final Clinical Impressions(s) / ED Diagnoses   Final diagnoses:  SIRS (systemic inflammatory response syndrome) (HCC)  Acute on chronic respiratory failure with hypoxia (HCC)   New Prescriptions New Prescriptions   No medications on file   I personally preformed the services scribed in my presence. The recorded information has been reviewed is accurate. Raeford Razor, MD.     Raeford Razor, MD 07/05/16 1640

## 2016-07-05 LAB — CBC
HEMATOCRIT: 40.6 % (ref 36.0–46.0)
HEMOGLOBIN: 13.1 g/dL (ref 12.0–15.0)
MCH: 28.5 pg (ref 26.0–34.0)
MCHC: 32.3 g/dL (ref 30.0–36.0)
MCV: 88.3 fL (ref 78.0–100.0)
Platelets: 296 10*3/uL (ref 150–400)
RBC: 4.6 MIL/uL (ref 3.87–5.11)
RDW: 15 % (ref 11.5–15.5)
WBC: 17.4 10*3/uL — ABNORMAL HIGH (ref 4.0–10.5)

## 2016-07-05 LAB — URINALYSIS, ROUTINE W REFLEX MICROSCOPIC
Bilirubin Urine: NEGATIVE
Glucose, UA: >=500 mg/dL — AB
KETONES UR: 20 mg/dL — AB
Nitrite: NEGATIVE
PH: 6 (ref 5.0–8.0)
PROTEIN: 30 mg/dL — AB
SQUAMOUS EPITHELIAL / LPF: NONE SEEN
Specific Gravity, Urine: 1.012 (ref 1.005–1.030)

## 2016-07-05 LAB — BLOOD GAS, ARTERIAL
Acid-Base Excess: 0.1 mmol/L (ref 0.0–2.0)
Bicarbonate: 24 mmol/L (ref 20.0–28.0)
Delivery systems: POSITIVE
Drawn by: 29503
Expiratory PAP: 7
FIO2: 40
Inspiratory PAP: 14
LHR: 10 {breaths}/min
MODE: POSITIVE
O2 Saturation: 96.1 %
Patient temperature: 98.6
pCO2 arterial: 38.5 mmHg (ref 32.0–48.0)
pH, Arterial: 7.412 (ref 7.350–7.450)
pO2, Arterial: 81 mmHg — ABNORMAL LOW (ref 83.0–108.0)

## 2016-07-05 LAB — PREGNANCY, URINE: PREG TEST UR: NEGATIVE

## 2016-07-05 LAB — BASIC METABOLIC PANEL
Anion gap: 8 (ref 5–15)
BUN: 8 mg/dL (ref 6–20)
CHLORIDE: 107 mmol/L (ref 101–111)
CO2: 24 mmol/L (ref 22–32)
CREATININE: 0.5 mg/dL (ref 0.44–1.00)
Calcium: 8.1 mg/dL — ABNORMAL LOW (ref 8.9–10.3)
GFR calc non Af Amer: >60 mL/min (ref >60)
GLUCOSE: 209 mg/dL — AB (ref 65–99)
Potassium: 3.8 mmol/L (ref 3.5–5.1)
Sodium: 139 mmol/L (ref 135–145)

## 2016-07-05 MED ORDER — LEVOFLOXACIN 500 MG PO TABS: 500.0000 mg | ORAL_TABLET | Freq: Every day | ORAL | 0 refills | Status: AC

## 2016-07-05 MED ORDER — ALBUTEROL SULFATE HFA 108 (90 BASE) MCG/ACT IN AERS: 2.0000 | INHALATION_SPRAY | Freq: Four times a day (QID) | RESPIRATORY_TRACT | 1 refills | Status: AC | PRN

## 2016-07-05 MED ORDER — PREDNISONE 10 MG (21) PO TBPK: ORAL_TABLET | ORAL | 0 refills | Status: DC

## 2016-07-05 MED ORDER — NICOTINE 21 MG/24HR TD PT24: 21.0000 mg | MEDICATED_PATCH | Freq: Every day | TRANSDERMAL | 0 refills | Status: DC

## 2016-07-05 MED ORDER — IPRATROPIUM-ALBUTEROL 0.5-2.5 (3) MG/3ML IN SOLN: 3.0000 mL | RESPIRATORY_TRACT | 4 refills | Status: DC | PRN

## 2016-07-05 NOTE — Progress Notes (Signed)
Pt got up to bathroom by herself, pt's daughter sitting in the room with the patient and did not call.  Pt already urinated and had stool and no specimen was obtained. Pt instructed that she was not to get up alone, not to remove herself from the monitor, and not to remove the I&O hat from the toilet. Pt verbalizes understanding. Bed alarm on. Aveya Beal P Aime Meloche

## 2016-07-05 NOTE — Care Management Note (Signed)
Case Management Note  Patient Details  Name: [name] MRN: [id number] Date of Birth: 10/19/1980  Subjective/Objective:                  36 y.o. female with a PMHx of COPD, emphysema, HTN, and asthma, who presents to the Emergency Department complaining of persistent shortness of breath beginning several days ago, acutely worsening this morning prior to arrival. Per pt's family, pt has been experiencing nausea, vomiting, diarrhea, subjective fevers, cough productive of sputum, and shortness of breath worse from her basleine over the past several days; however, this morning her breathing acutely worsened. Pt notes associated chest pain, headache, and dizziness while in the ED. She is currently prescribed 3-5L O2 at home which has not been assisting with her shortness of breath. No h/o PE/DVT. No sick contacts with similar symptoms. Pt denies leg swelling, urgency, frequency, hematuria, dysuria, difficulty urinating, abdominal pain, or any other associated symptoms.   Action/Plan: Date:  July 05, 2016 Chart reviewed for concurrent status and case management needs. Will continue to follow patient progress. Discharge Planning: following for needs Expected discharge date: [id number] [name], BSN, [place]3, [place]   [phone]  Expected Discharge Date:                  Expected Discharge Plan:  Home/Self Care  In-House Referral:     Discharge planning Services  CM Consult  Post Acute Care Choice:    Choice offered to:     DME Arranged:    DME Agency:     HH Arranged:    HH Agency:     Status of Service:  In process, will continue to follow  If discussed at Long Length of Stay Meetings, dates discussed:    Additional Comments:  Golda AcreDavis, Cesilia Shinn Lynn, RN 07/05/2016, 8:38 AM

## 2016-07-05 NOTE — Progress Notes (Signed)
Pt has decided to leave against medical advice. AMA form signed. MD at bedside.

## 2016-07-06 NOTE — Discharge Summary (Addendum)
Physician Discharge Summary  [name] ZOX:[id number] DOB: 09/15/1980 DOA: 07/04/2016  PCP: Rometta Emery, MD  Admit date: 07/04/2016 Discharge date: 07/05/2016  Patient left AMA, Reported she had to take care of things at home.  Agreed to go to the pharmacy to pick up medications.   Brief/Interim Summary:  [name] is a 36 y.o. female with medical history significant of COPD, hypertension, bipolar disorder, anxiety presents to [facility] with sob , fever for a couple of days , associated with chills, nausea, vomiting and watery diarrhea for 2 days. She was admittd for evaluation of viral syndrome.   Discharge Diagnoses:  Active Problems:   COPD (chronic obstructive pulmonary disease) (HCC)   Acute respiratory failure with hypoxia (HCC) Morbidly obese  SIRS/ Acute respiratory failure with hypoxia secondary to acute COPD exacerbation requiring BIPAP and viral syndrome.  Admitted to step down for closer monitoring. Pt febrile, tachycardic, tachypnic, hypoxic , leukocytosis on admission, which had improved.  D dimer was elevated, scheduled to get V/Q scan, pt reported she had to take care of things at home, so she signed AMA papers and left home.  Gave her presc for prednisone taper, duonebs, antibiotic and outpatient sleep study.     Bipolar disorder:  Resume home meds     Hypokalemia: replaced.    Hypertension:  Wellcontrolled.    Discharge Instructions   Allergies as of 07/05/2016      Reactions   Iohexol Cough      Medication List    TAKE these medications   albuterol 108 (90 Base) MCG/ACT inhaler Commonly known as:  PROVENTIL HFA;VENTOLIN HFA Inhale 2 puffs into the lungs every 6 (six) hours as needed. For shortness of breath.   ipratropium-albuterol 0.5-2.5 (3) MG/3ML Soln Commonly known as:  DUONEB Take 3 mLs by nebulization every 4 (four) hours as needed.   levofloxacin 500 MG tablet Commonly known as:  LEVAQUIN Take 1 tablet (500 mg  total) by mouth daily.   nicotine 21 mg/24hr patch Commonly known as:  NICODERM CQ - dosed in mg/24 hours Place 1 patch (21 mg total) onto the skin daily.   predniSONE 10 MG (21) Tbpk tablet Commonly known as:  STERAPRED UNI-PAK 21 TAB Prednisone 60 mg daily for 3 days followed by Prednisone 40 mg daily for 3 days followed by  Prednisone 30 mg daily for 3 days followed by  Prednisone 20 mg daily for 3 days and stop.     ASK your doctor about these medications   beclomethasone 80 MCG/ACT inhaler Commonly known as:  QVAR Inhale 2 puffs into the lungs 2 (two) times daily.   clonazePAM 0.5 MG tablet Commonly known as:  KLONOPIN Take 2 tablets (1 mg total) by mouth 2 (two) times daily as needed for anxiety (take 1mg  every am and .5mg  every pm ).   escitalopram 20 MG tablet Commonly known as:  LEXAPRO Take 20 mg by mouth daily.   gabapentin 300 MG capsule Commonly known as:  NEURONTIN Take 300 mg by mouth 3 (three) times daily.   HYDROcodone-homatropine 5-1.5 MG/5ML syrup Commonly known as:  HYCODAN Take 5 mLs by mouth every 6 (six) hours as needed for cough.   ibuprofen 800 MG tablet Commonly known as:  ADVIL,MOTRIN Take 1 tablet (800 mg total) by mouth 3 (three) times daily.   lurasidone 80 MG Tabs tablet Commonly known as:  LATUDA Take 80 mg by mouth daily with breakfast.   methocarbamol 500 MG tablet Commonly known  as:  ROBAXIN Take 1 tablet (500 mg total) by mouth 2 (two) times daily as needed for muscle spasms.   metroNIDAZOLE 500 MG tablet Commonly known as:  FLAGYL Take two tablets by mouth twice a day, for one day.  Or you can take all four tablets at once if you can tolerate it.       Allergies  Allergen Reactions  . Iohexol Cough    Consultations: none  Procedures/Studies: Dg Chest Portable 1 View  Result Date: 07/04/2016 CLINICAL DATA:  Cough and fever EXAM: PORTABLE CHEST 1 VIEW COMPARISON:  10/18/2014 FINDINGS: Cardiac shadow is mildly enlarged.  Poor inspiratory effort is again noted and stable. No focal infiltrate or sizable effusion is seen. Old healed rib fractures are noted on the right. No acute abnormality is noted. IMPRESSION: No acute abnormality noted. Electronically Signed   By: Alcide Clever M.D.   On: 07/04/2016 12:10        Subjective:  Wanted to leave AMA, despite the risks.  Discharge Exam: Vitals:   07/05/16 0809 07/05/16 0900  BP: 121/60   Pulse: (!) 102 (!) 102  Resp: (!) 22 11  Temp:     Vitals:   07/05/16 0800 07/05/16 0809 07/05/16 0835 07/05/16 0900  BP:  121/60    Pulse: (!) 107 (!) 102  (!) 102  Resp: (!) 24 (!) 22  11  Temp: 97.2 F (36.2 C)     TempSrc: Axillary     SpO2: 98% 94% 95% 92%  Weight:      Height:        General: Pt is alert, awake, not in acute distress Cardiovascular: RRR, S1/S2 +, no rubs, no gallops Respiratory: CTA bilaterally, no wheezing, no rhonchi Abdominal: Soft, NT, ND, bowel sounds + Extremities: no edema, no cyanosis    The results of significant diagnostics from this hospitalization (including imaging, microbiology, ancillary and laboratory) are listed below for reference.     Microbiology: Recent Results (from the past 240 hour(s))  MRSA PCR Screening     Status: None   Collection Time: 07/04/16  5:01 PM  Result Value Ref Range Status   MRSA by PCR NEGATIVE NEGATIVE Final    Comment:        The GeneXpert MRSA Assay (FDA approved for NASAL specimens only), is one component of a comprehensive MRSA colonization surveillance program. It is not intended to diagnose MRSA infection nor to guide or monitor treatment for MRSA infections.   Culture, blood (Routine X 2) w Reflex to ID Panel     Status: None (Preliminary result)   Collection Time: 07/04/16  6:23 PM  Result Value Ref Range Status   Specimen Description BLOOD LEFT HAND  Final   Special Requests IN PEDIATRIC BOTTLE Blood Culture adequate volume  Final   Culture   Final    NO GROWTH < 24  HOURS Performed at [facility], [address], [place], [place] [address]    Report Status PENDING  Incomplete  Culture, blood (Routine X 2) w Reflex to ID Panel     Status: None (Preliminary result)   Collection Time: 07/04/16  6:23 PM  Result Value Ref Range Status   Specimen Description BLOOD LEFT HAND  Final   Special Requests   Final    BOTTLES DRAWN AEROBIC ONLY Blood Culture adequate volume   Culture   Final    NO GROWTH < 24 HOURS Performed at [facility], [address]  [place], [place] [address]    Report Status PENDING  Incomplete     Labs: BNP (last 3 results)  Recent Labs  07/04/16 1144  BNP 27.3   Basic Metabolic Panel:  Recent Labs Lab 07/04/16 1144 07/04/16 1738 07/05/16 0341  NA 133*  --  139  K 3.0*  --  3.8  CL 100*  --  107  CO2 23  --  24  GLUCOSE 157*  --  209*  BUN 8  --  8  CREATININE 0.59  --  0.50  CALCIUM 7.8*  --  8.1*  MG  --  1.8  --    Liver Function Tests: No results for input(s): AST, ALT, ALKPHOS, BILITOT, PROT, ALBUMIN in the last 168 hours. No results for input(s): LIPASE, AMYLASE in the last 168 hours. No results for input(s): AMMONIA in the last 168 hours. CBC:  Recent Labs Lab 07/04/16 1144 07/05/16 0341  WBC 11.5* 17.4*  NEUTROABS 9.1*  --   HGB 13.6 13.1  HCT 40.7 40.6  MCV 87.5 88.3  PLT 298 296   Cardiac Enzymes: No results for input(s): CKTOTAL, CKMB, CKMBINDEX, TROPONINI in the last 168 hours. BNP: Invalid input(s): POCBNP CBG: No results for input(s): GLUCAP in the last 168 hours. D-Dimer  Recent Labs  07/04/16 1738  DDIMER 3.66*   Hgb A1c No results for input(s): HGBA1C in the last 72 hours. Lipid Profile No results for input(s): CHOL, HDL, LDLCALC, TRIG, CHOLHDL, LDLDIRECT in the last 72 hours. Thyroid function studies No results for input(s): TSH, T4TOTAL, T3FREE, THYROIDAB in the last 72 hours.  Invalid input(s): FREET3 Anemia work up No results for input(s): VITAMINB12,  FOLATE, FERRITIN, TIBC, IRON, RETICCTPCT in the last 72 hours. Urinalysis    Component Value Date/Time   COLORURINE RED (A) 07/05/2016 0630   APPEARANCEUR HAZY (A) 07/05/2016 0630   LABSPEC 1.012 07/05/2016 0630   PHURINE 6.0 07/05/2016 0630   GLUCOSEU >=500 (A) 07/05/2016 0630   HGBUR LARGE (A) 07/05/2016 0630   BILIRUBINUR NEGATIVE 07/05/2016 0630   KETONESUR 20 (A) 07/05/2016 0630   PROTEINUR 30 (A) 07/05/2016 0630   UROBILINOGEN 0.2 01/22/2015 1523   NITRITE NEGATIVE 07/05/2016 0630   LEUKOCYTESUR TRACE (A) 07/05/2016 0630   Sepsis Labs Invalid input(s): PROCALCITONIN,  WBC,  LACTICIDVEN Microbiology Recent Results (from the past 240 hour(s))  MRSA PCR Screening     Status: None   Collection Time: 07/04/16  5:01 PM  Result Value Ref Range Status   MRSA by PCR NEGATIVE NEGATIVE Final    Comment:        The GeneXpert MRSA Assay (FDA approved for NASAL specimens only), is one component of a comprehensive MRSA colonization surveillance program. It is not intended to diagnose MRSA infection nor to guide or monitor treatment for MRSA infections.   Culture, blood (Routine X 2) w Reflex to ID Panel     Status: None (Preliminary result)   Collection Time: 07/04/16  6:23 PM  Result Value Ref Range Status   Specimen Description BLOOD LEFT HAND  Final   Special Requests IN PEDIATRIC BOTTLE Blood Culture adequate volume  Final   Culture   Final    NO GROWTH < 24 HOURS Performed at [facility], [address] [place], [place] [address]    Report Status PENDING  Incomplete  Culture, blood (Routine X 2) w Reflex to ID Panel     Status: None (Preliminary result)   Collection Time: 07/04/16  6:23 PM  Result Value Ref Range Status   Specimen Description BLOOD LEFT HAND  Final   Special Requests   Final    BOTTLES DRAWN AEROBIC ONLY Blood Culture adequate volume   Culture   Final    NO GROWTH < 24 HOURS Performed at [facility], [address] [place],  [place] [id number]    Report Status PENDING  Incomplete     Time coordinating discharge: Over 30 minutes  SIGNED:   Kathlen ModyAKULA,Conall Vangorder, MD  [facility] 07/06/2016, 10:57 AM Pager   If 7PM-7AM, please contact night-coverage [email or web address] Password [email or web address]

## 2016-07-09 LAB — CULTURE, BLOOD (ROUTINE X 2)
Culture: NO GROWTH
Culture: NO GROWTH
SPECIAL REQUESTS: ADEQUATE
SPECIAL REQUESTS: ADEQUATE

## 2016-08-03 ENCOUNTER — Telehealth: Payer: Self-pay | Admitting: *Deleted

## 2016-08-03 DIAGNOSIS — N760 Acute vaginitis: Principal | ICD-10-CM

## 2016-08-03 DIAGNOSIS — B9689 Other specified bacterial agents as the cause of diseases classified elsewhere: Secondary | ICD-10-CM

## 2016-08-03 MED ORDER — METRONIDAZOLE 500 MG PO TABS: 500.0000 mg | ORAL_TABLET | Freq: Two times a day (BID) | ORAL | 0 refills | Status: DC

## 2016-08-03 NOTE — Telephone Encounter (Signed)
Pt called the office c/o a vaginal discharge with odor.  Was treated for trichomonas a few months ago with Flagyl but rx the dosing for trichomonas.  BV was also noted on lab result at that time.  Flagyl sent to pharmacy per dosing for BV and instructed pt on medication use.

## 2016-08-19 IMAGING — DX DG CHEST 2V
2 series · 2 of 2 positions shown · non-contrast
Comparison: 09/01/2014

CLINICAL DATA: Sick for the last 2 weeks. Shortness of breath. Pain
in the right ribs. Chest tightness and dizziness.

EXAM:
CHEST  2 VIEW

[chest pa]
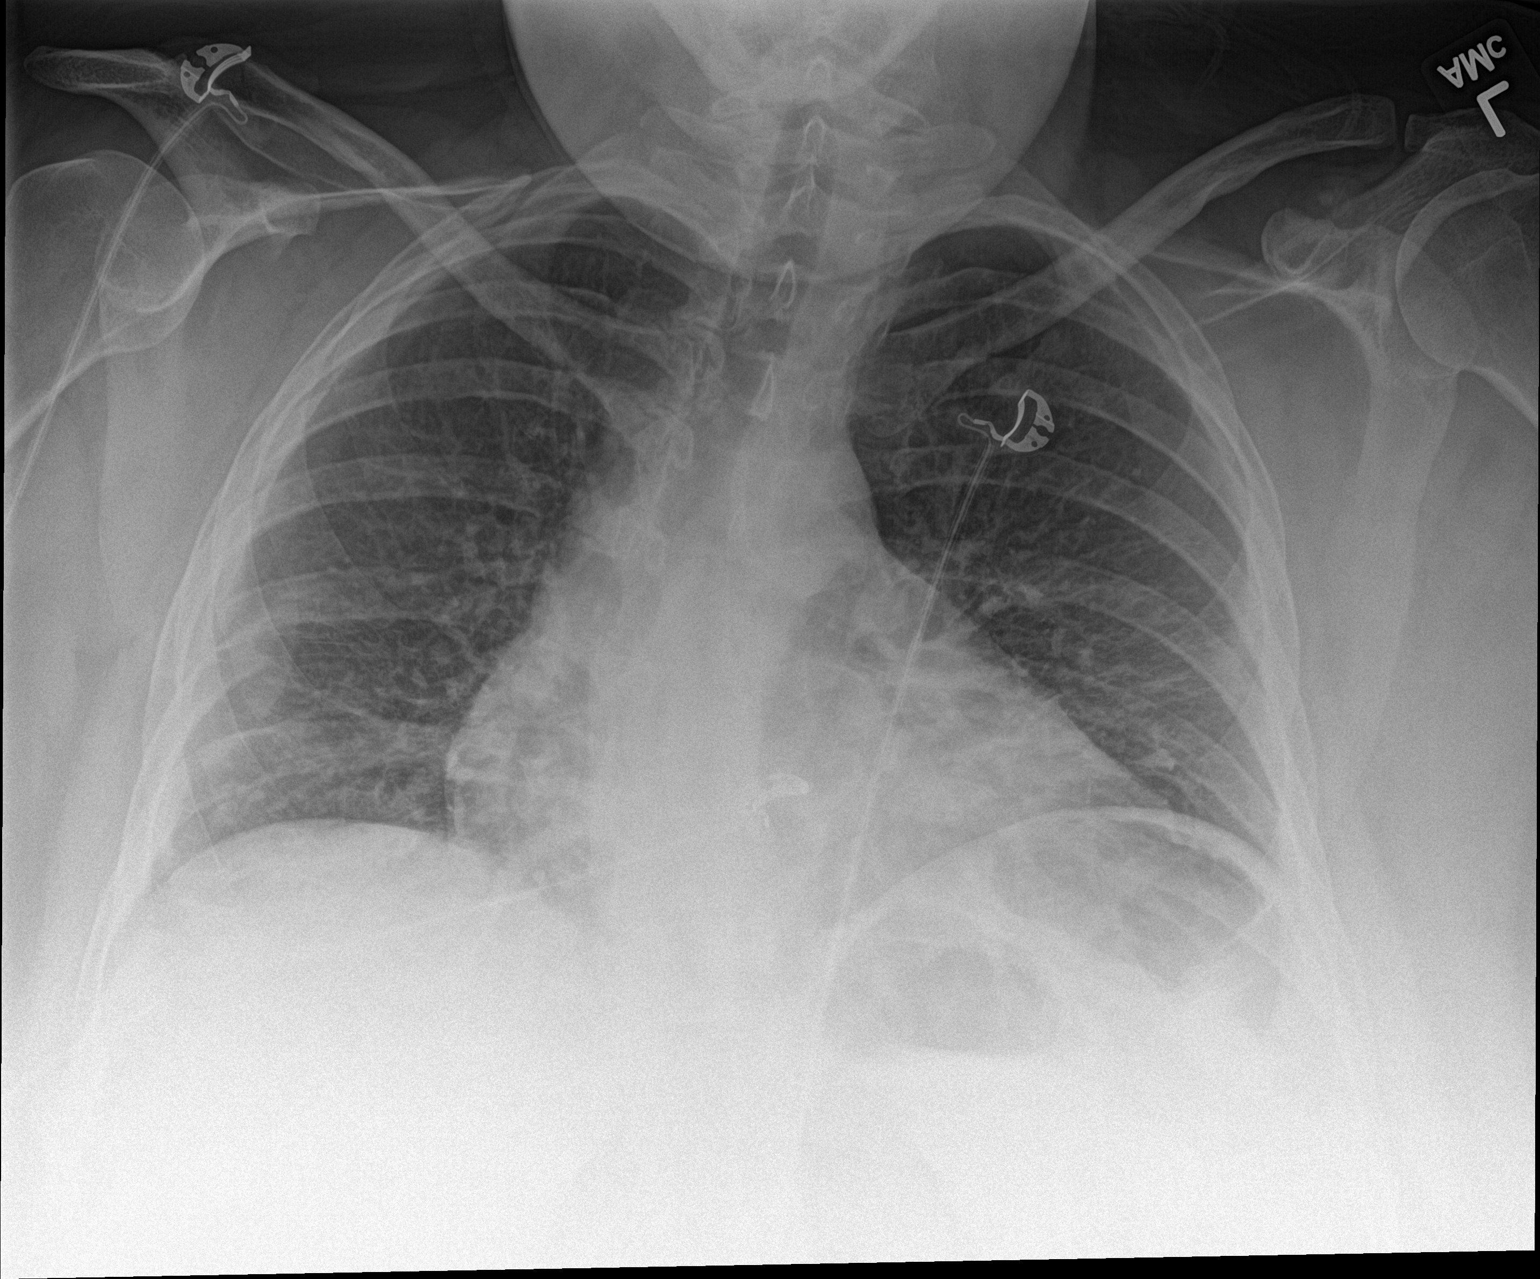

[chest lat]
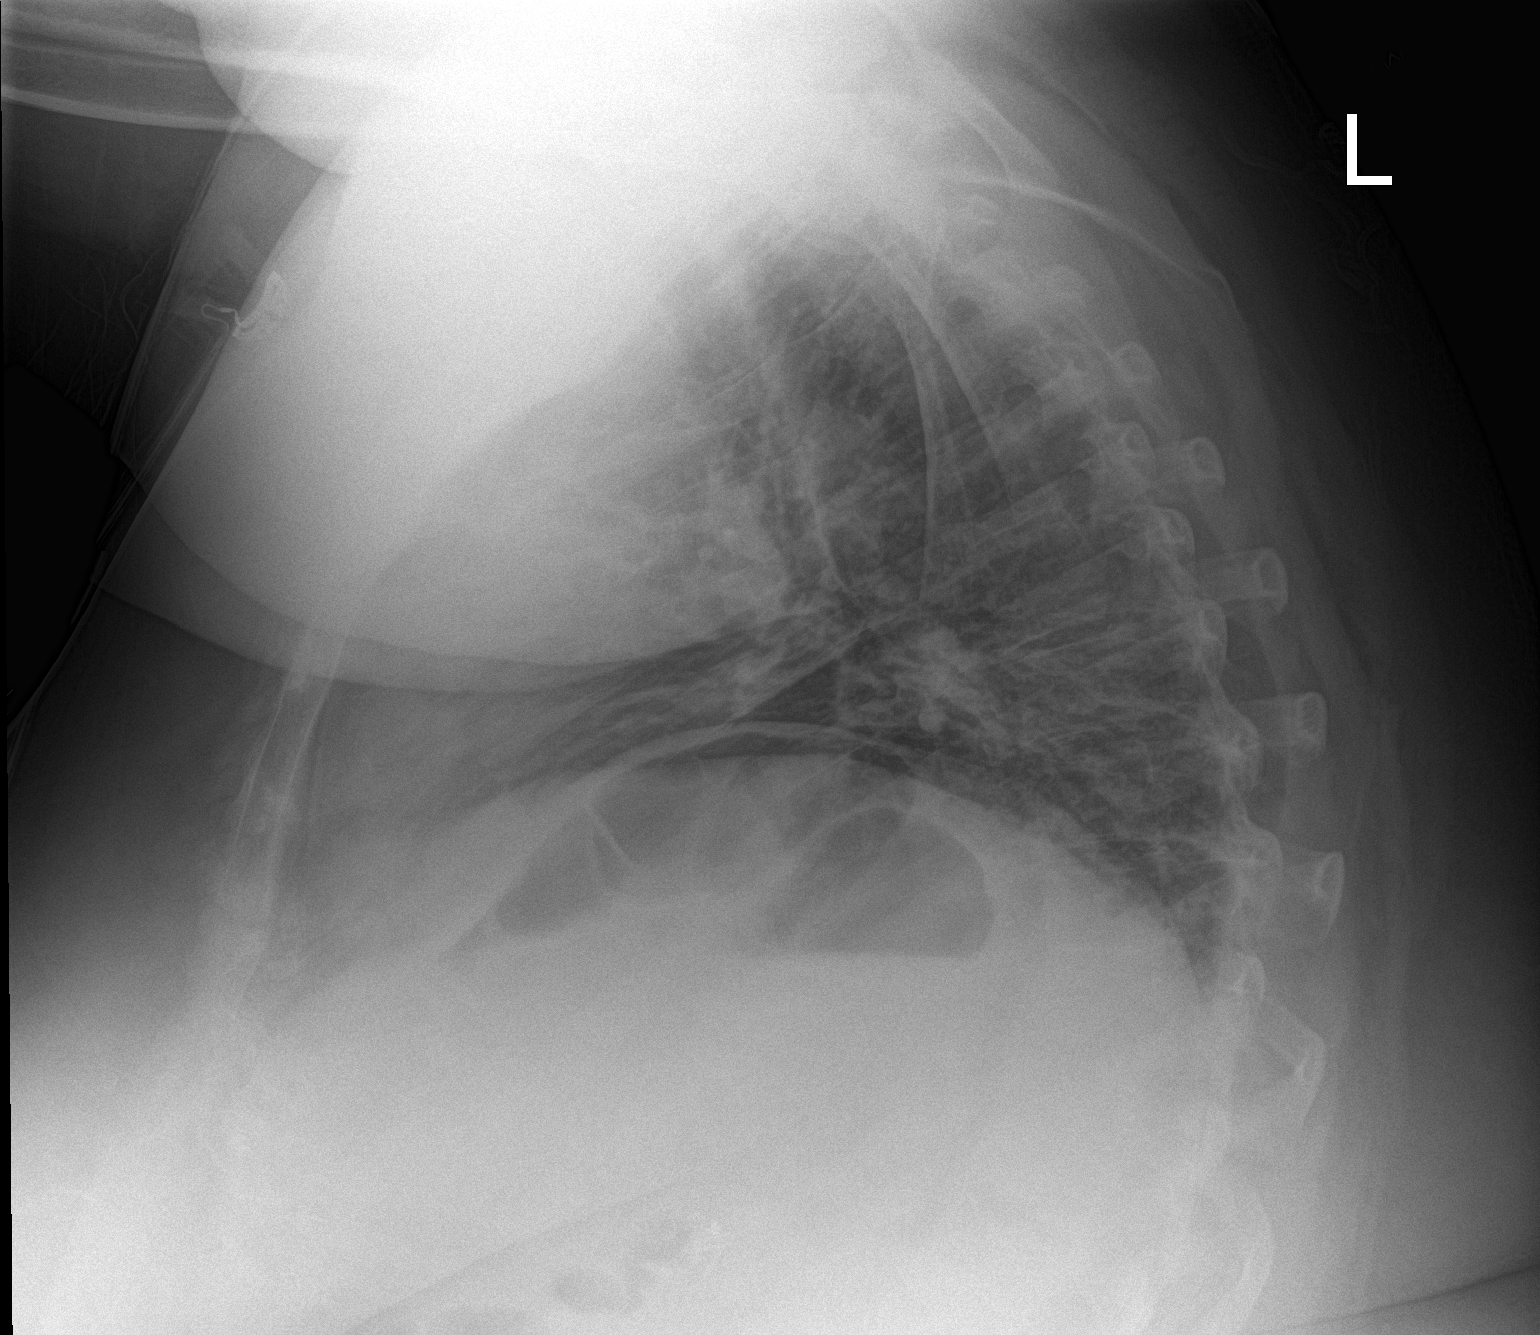

[2 of 2 positions shown; findings below may reference images not displayed]

FINDINGS: Shallow inspiration. Heart size and pulmonary vascularity are
probably normal for technique. Slight atelectasis in the lung bases.
No focal consolidation. No blunting of costophrenic angles. No
pneumothorax. Mediastinal contours appear intact. Thoracic scoliosis
convex towards the right.
IMPRESSION: Shallow inspiration with linear atelectasis in the lung bases.

## 2016-09-27 ENCOUNTER — Institutional Professional Consult (permissible substitution): Payer: Medicare Other | Admitting: Internal Medicine

## 2016-10-21 ENCOUNTER — Institutional Professional Consult (permissible substitution): Payer: Medicare Other | Admitting: Internal Medicine

## 2016-11-11 ENCOUNTER — Institutional Professional Consult (permissible substitution): Payer: Medicare Other | Admitting: Pulmonary Disease

## 2016-11-22 ENCOUNTER — Institutional Professional Consult (permissible substitution): Payer: Medicare Other | Admitting: Pulmonary Disease

## 2017-01-03 ENCOUNTER — Institutional Professional Consult (permissible substitution): Payer: Medicare Other | Admitting: Pulmonary Disease

## 2017-05-01 ENCOUNTER — Ambulatory Visit (HOSPITAL_COMMUNITY): Payer: Medicare Other | Admitting: Psychiatry

## 2018-01-20 ENCOUNTER — Emergency Department (HOSPITAL_COMMUNITY)
Admission: EM | Admit: 2018-01-20 | Discharge: 2018-01-20 | Disposition: A | Discharge status: Home / Self Care | Payer: Medicare Other | Attending: Emergency Medicine | Admitting: Emergency Medicine

## 2018-01-20 ENCOUNTER — Encounter (HOSPITAL_COMMUNITY): Payer: Self-pay | Admitting: Emergency Medicine

## 2018-01-20 DIAGNOSIS — Z79899 Other long term (current) drug therapy: Secondary | ICD-10-CM | POA: Diagnosis not present

## 2018-01-20 DIAGNOSIS — M549 Dorsalgia, unspecified: Secondary | ICD-10-CM | POA: Diagnosis present

## 2018-01-20 DIAGNOSIS — Z87891 Personal history of nicotine dependence: Secondary | ICD-10-CM | POA: Insufficient documentation

## 2018-01-20 DIAGNOSIS — M5441 Lumbago with sciatica, right side: Secondary | ICD-10-CM | POA: Diagnosis not present

## 2018-01-20 DIAGNOSIS — J449 Chronic obstructive pulmonary disease, unspecified: Secondary | ICD-10-CM | POA: Insufficient documentation

## 2018-01-20 MED ORDER — CYCLOBENZAPRINE HCL 10 MG PO TABS: 10.0000 mg | ORAL_TABLET | Freq: Two times a day (BID) | ORAL | 0 refills | Status: DC | PRN

## 2018-01-20 MED ORDER — PREDNISONE 20 MG PO TABS
60.0000 mg | ORAL_TABLET | Freq: Once | ORAL | Status: AC
  Administered 2018-01-20: 60 mg via ORAL
  Filled 2018-01-20: qty 3

## 2018-01-20 MED ORDER — OXYCODONE-ACETAMINOPHEN 5-325 MG PO TABS
1.0000 | ORAL_TABLET | Freq: Once | ORAL | Status: AC
  Administered 2018-01-20: 1 via ORAL
  Filled 2018-01-20: qty 1

## 2018-01-20 NOTE — ED Triage Notes (Addendum)
Pt fell in the shower yesterday, since then back pain and pain down her legs.  Mechanical fall, no LOC, blood thinners and did not hit head. States she has trouble walking, did walk into hospital

## 2018-01-20 NOTE — ED Provider Notes (Signed)
[facility] EMERGENCY DEPARTMENT Provider Note   CSN: [id number] Arrival date & time: 01/20/18  9562     History   Chief Complaint Chief Complaint  Patient presents with  . Fall  . Back Pain    HPI [name] is a 37 y.o. female with past medical history of COPD, anxiety, bipolar disorder, presenting to the emergency department with complaint of acute onset of worsening right lower back pain that began yesterday.  Patient states she normally has low back pain, however while in the shower yesterday her right foot slipped laterally, and she caught herself however she has had gradually worsening pain to the right buttock since that time.  Pain is radiating down the posterior leg. Pt Is worse with walking and sitting for long periods.  She took 5mg  of Flexeril and 800 mg of ibuprofen with minimal relief.  No numbness or weakness to the legs, no bowel or bladder incontinence, no saddle paresthesia.  The history is provided by the patient.    Past Medical History:  Diagnosis Date  . Anxiety   . Arthritis   . Asthma   . Bipolar 1 disorder (HCC)   . COPD (chronic obstructive pulmonary disease) (HCC)   . Depression   . Emphysema   . Pneumonia   . Rib fracture     Patient Active Problem List   Diagnosis Date Noted  . COPD (chronic obstructive pulmonary disease) (HCC) 07/04/2016  . Acute respiratory failure with hypoxia (HCC) 07/04/2016    Past Surgical History:  Procedure Laterality Date  . CHOLECYSTECTOMY    . TUBAL LIGATION       OB History    Gravida  6   Para  3   Term  3   Preterm  0   AB  3   Living  3     SAB  3   TAB  0   Ectopic  0   Multiple  0   Live Births               Home Medications    Prior to Admission medications   Medication Sig Start Date End Date Taking? Authorizing Provider  albuterol (PROVENTIL HFA;VENTOLIN HFA) 108 (90 Base) MCG/ACT inhaler Inhale 2 puffs into the lungs every 6 (six) hours as  needed. For shortness of breath. 07/05/16   Kathlen Mody, MD  beclomethasone (QVAR) 80 MCG/ACT inhaler Inhale 2 puffs into the lungs 2 (two) times daily.    [provider]  clonazePAM (KLONOPIN) 0.5 MG tablet Take 2 tablets (1 mg total) by mouth 2 (two) times daily as needed for anxiety (take 1mg  every am and .5mg  every pm ). 01/29/11 07/04/16  Jethro Bastos, NP  cyclobenzaprine (FLEXERIL) 10 MG tablet Take 1 tablet (10 mg total) by mouth 2 (two) times daily as needed for muscle spasms. 01/20/18   Horace Lukas, [place] N, PA-C  escitalopram (LEXAPRO) 20 MG tablet Take 20 mg by mouth daily.      [provider]  gabapentin (NEURONTIN) 300 MG capsule Take 300 mg by mouth 3 (three) times daily.      [provider]  HYDROcodone-homatropine (HYCODAN) 5-1.5 MG/5ML syrup Take 5 mLs by mouth every 6 (six) hours as needed for cough. Patient not taking: Reported on 01/22/2015 09/01/14   Mady Gemma, PA-C  ibuprofen (ADVIL,MOTRIN) 800 MG tablet Take 1 tablet (800 mg total) by mouth 3 (three) times daily. Patient not taking: Reported on 07/04/2016 05/31/13  Elwin MochaWalden, Blair, MD  ipratropium-albuterol (DUONEB) 0.5-2.5 (3) MG/3ML SOLN Take 3 mLs by nebulization every 4 (four) hours as needed. 07/05/16   Kathlen ModyAkula, Vijaya, MD  lurasidone (LATUDA) 80 MG TABS tablet Take 80 mg by mouth daily with breakfast.    [provider]  methocarbamol (ROBAXIN) 500 MG tablet Take 1 tablet (500 mg total) by mouth 2 (two) times daily as needed for muscle spasms. 09/01/14   Mady GemmaWestfall, Elizabeth C, PA-C  metroNIDAZOLE (FLAGYL) 500 MG tablet Take 1 tablet (500 mg total) by mouth 2 (two) times daily. 08/03/16   Federico FlakeNewton, Kimberly Niles, MD  nicotine (NICODERM CQ - DOSED IN MG/24 HOURS) 21 mg/24hr patch Place 1 patch (21 mg total) onto the skin daily. 07/06/16   Kathlen ModyAkula, Vijaya, MD  predniSONE (STERAPRED UNI-PAK 21 TAB) 10 MG (21) TBPK tablet Prednisone 60 mg daily for 3 days followed by Prednisone 40 mg daily  for 3 days followed by  Prednisone 30 mg daily for 3 days followed by  Prednisone 20 mg daily for 3 days and stop. 07/05/16   Kathlen ModyAkula, Vijaya, MD    Family History No family history on file.  Social History Social History   Tobacco Use  . Smoking status: Former Smoker    Packs/day: 0.00    Last attempt to quit: 05/07/2011    Years since quitting: 6.7  . Smokeless tobacco: Never Used  Substance Use Topics  . Alcohol use: No  . Drug use: No     Allergies   Iohexol   Review of Systems Review of Systems  Gastrointestinal:       No bowel incontinence  Genitourinary: Negative for difficulty urinating.  Musculoskeletal: Positive for back pain.  Neurological: Negative for weakness and numbness.     Physical Exam Updated Vital Signs BP 127/87 (BP Location: Right Wrist)   Pulse 97   Temp 97.7 F (36.5 C) (Oral)   Resp (!) 24   SpO2 92%   Physical Exam Vitals signs and nursing note reviewed.  Constitutional:      Appearance: She is well-developed.  HENT:     Head: Normocephalic and atraumatic.  Eyes:     Conjunctiva/sclera: Conjunctivae normal.  Cardiovascular:     Rate and Rhythm: Normal rate and regular rhythm.  Pulmonary:     Effort: Pulmonary effort is normal.     Breath sounds: Normal breath sounds.  Musculoskeletal:     Comments: No midline spinal tenderness, TTP to b/l lumbar paraspinal musculature, extending to the right upper gluteal region.  No bony step-offs or gross deformities.  Neurological:     Mental Status: She is alert.     Comments: Motor:  Normal tone. 5/5 in lower extremities bilaterally including strong and equal dorsiflexion/plantar flexion Sensory: Pinprick and light touch normal in BLE extremities.  Deep Tendon Reflexes: 2+ and symmetric in the b/l patella Gait: normal gait and balance CV: distal pulses palpable throughout    Psychiatric:        Mood and Affect: Mood normal.        Behavior: Behavior normal.      ED Treatments /  Results  Labs (all labs ordered are listed, but only abnormal results are displayed) Labs Reviewed - No data to display  EKG None  Radiology No results found.  Procedures Procedures (including critical care time)  Medications Ordered in ED Medications  predniSONE (DELTASONE) tablet 60 mg (60 mg Oral Given 01/20/18 0856)  oxyCODONE-acetaminophen (PERCOCET/ROXICET) 5-325 MG per tablet 1 tablet (1 tablet  Oral Given 01/20/18 0856)     Initial Impression / Assessment and Plan / ED Course  I have reviewed the triage vital signs and the nursing notes.  Pertinent labs & imaging results that were available during my care of the patient were reviewed by me and considered in my medical decision making (see chart for details).  Patient with exacerbation of chronic back pain radiation down the right leg after slipping in the shower.  Did not fall.  Pain radiates down the right leg, suspect sciatica.  On exam, no neurological deficits and normal neuro exam.  Patient can walk but states is painful.  No loss of bowel or bladder control.  No concern for cauda equina. RICE protocol and pain medicine indicated and discussed with patient.   Discussed results, findings, treatment and follow up. Patient advised of return precautions. Patient verbalized understanding and agreed with plan.  Final Clinical Impressions(s) / ED Diagnoses   Final diagnoses:  Right-sided low back pain with right-sided sciatica, unspecified chronicity    ED Discharge Orders         Ordered    cyclobenzaprine (FLEXERIL) 10 MG tablet  2 times daily PRN     01/20/18 0914           Guliana Weyandt, [place] N, PA-C 01/20/18 1027    Maia PlanLong, Joshua G, MD 01/20/18 2032

## 2018-01-20 NOTE — Discharge Instructions (Signed)
Please read instructions below.  °You can take 600 mg of ibuprofen every 6 hours as needed for pain.   °Apply ice to your back for 20 minutes at a time.  You can also apply heat if this provides more relief.   °You can take Flexeril/cyclobenzaprine every 12 hours as needed for muscle spasm.  Be aware this medication can make you drowsy; do not take while driving or drinking alcohol.   °Follow-up with your primary care provider symptoms persist.   °Return to ER if new numbness or tingling in your arms or legs, inability to urinate, inability to hold your bowels, or weakness in your extremities.  ° °

## 2018-04-17 ENCOUNTER — Other Ambulatory Visit (HOSPITAL_BASED_OUTPATIENT_CLINIC_OR_DEPARTMENT_OTHER): Payer: Self-pay

## 2018-04-17 DIAGNOSIS — G4733 Obstructive sleep apnea (adult) (pediatric): Secondary | ICD-10-CM

## 2018-05-01 ENCOUNTER — Emergency Department (HOSPITAL_COMMUNITY): Payer: Medicare Other

## 2018-05-01 ENCOUNTER — Inpatient Hospital Stay (HOSPITAL_COMMUNITY)
Admission: EM | Admit: 2018-05-01 | Discharge: 2018-05-02 | DRG: 871 | Discharge status: Against Medical Advice | Payer: Medicare Other | Attending: Internal Medicine | Admitting: Internal Medicine

## 2018-05-01 ENCOUNTER — Other Ambulatory Visit: Payer: Self-pay

## 2018-05-01 ENCOUNTER — Encounter (HOSPITAL_COMMUNITY): Payer: Self-pay | Admitting: Emergency Medicine

## 2018-05-01 ENCOUNTER — Other Ambulatory Visit (HOSPITAL_COMMUNITY): Payer: Self-pay

## 2018-05-01 DIAGNOSIS — G4733 Obstructive sleep apnea (adult) (pediatric): Secondary | ICD-10-CM | POA: Diagnosis not present

## 2018-05-01 DIAGNOSIS — Z5329 Procedure and treatment not carried out because of patient's decision for other reasons: Secondary | ICD-10-CM | POA: Diagnosis present

## 2018-05-01 DIAGNOSIS — I10 Essential (primary) hypertension: Secondary | ICD-10-CM | POA: Diagnosis present

## 2018-05-01 DIAGNOSIS — Z6841 Body Mass Index (BMI) 40.0 and over, adult: Secondary | ICD-10-CM | POA: Diagnosis not present

## 2018-05-01 DIAGNOSIS — R0602 Shortness of breath: Secondary | ICD-10-CM | POA: Diagnosis present

## 2018-05-01 DIAGNOSIS — E872 Acidosis, unspecified: Secondary | ICD-10-CM | POA: Diagnosis present

## 2018-05-01 DIAGNOSIS — R042 Hemoptysis: Secondary | ICD-10-CM | POA: Diagnosis present

## 2018-05-01 DIAGNOSIS — J9621 Acute and chronic respiratory failure with hypoxia: Secondary | ICD-10-CM | POA: Diagnosis present

## 2018-05-01 DIAGNOSIS — Z20828 Contact with and (suspected) exposure to other viral communicable diseases: Secondary | ICD-10-CM | POA: Diagnosis present

## 2018-05-01 DIAGNOSIS — F419 Anxiety disorder, unspecified: Secondary | ICD-10-CM | POA: Diagnosis present

## 2018-05-01 DIAGNOSIS — Z79899 Other long term (current) drug therapy: Secondary | ICD-10-CM

## 2018-05-01 DIAGNOSIS — R739 Hyperglycemia, unspecified: Secondary | ICD-10-CM | POA: Diagnosis present

## 2018-05-01 DIAGNOSIS — A419 Sepsis, unspecified organism: Principal | ICD-10-CM | POA: Diagnosis present

## 2018-05-01 DIAGNOSIS — J9601 Acute respiratory failure with hypoxia: Secondary | ICD-10-CM | POA: Diagnosis not present

## 2018-05-01 DIAGNOSIS — E876 Hypokalemia: Secondary | ICD-10-CM | POA: Diagnosis present

## 2018-05-01 DIAGNOSIS — R6520 Severe sepsis without septic shock: Secondary | ICD-10-CM | POA: Diagnosis present

## 2018-05-01 DIAGNOSIS — R0902 Hypoxemia: Secondary | ICD-10-CM

## 2018-05-01 DIAGNOSIS — Z9049 Acquired absence of other specified parts of digestive tract: Secondary | ICD-10-CM | POA: Diagnosis not present

## 2018-05-01 DIAGNOSIS — E874 Mixed disorder of acid-base balance: Secondary | ICD-10-CM | POA: Diagnosis present

## 2018-05-01 DIAGNOSIS — F319 Bipolar disorder, unspecified: Secondary | ICD-10-CM | POA: Diagnosis not present

## 2018-05-01 DIAGNOSIS — J441 Chronic obstructive pulmonary disease with (acute) exacerbation: Secondary | ICD-10-CM | POA: Diagnosis present

## 2018-05-01 DIAGNOSIS — J189 Pneumonia, unspecified organism: Secondary | ICD-10-CM | POA: Diagnosis present

## 2018-05-01 DIAGNOSIS — Z888 Allergy status to other drugs, medicaments and biological substances status: Secondary | ICD-10-CM | POA: Diagnosis not present

## 2018-05-01 DIAGNOSIS — J449 Chronic obstructive pulmonary disease, unspecified: Secondary | ICD-10-CM | POA: Diagnosis present

## 2018-05-01 DIAGNOSIS — R509 Fever, unspecified: Secondary | ICD-10-CM

## 2018-05-01 DIAGNOSIS — J410 Simple chronic bronchitis: Secondary | ICD-10-CM | POA: Diagnosis not present

## 2018-05-01 DIAGNOSIS — Z87891 Personal history of nicotine dependence: Secondary | ICD-10-CM

## 2018-05-01 DIAGNOSIS — R6889 Other general symptoms and signs: Secondary | ICD-10-CM | POA: Diagnosis not present

## 2018-05-01 HISTORY — DX: Obesity, unspecified: E66.9

## 2018-05-01 LAB — COMPREHENSIVE METABOLIC PANEL
ALT: 19 U/L (ref 0–44)
AST: 17 U/L (ref 15–41)
Albumin: 3.2 g/dL — ABNORMAL LOW (ref 3.5–5.0)
Alkaline Phosphatase: 78 U/L (ref 38–126)
Anion gap: 11 (ref 5–15)
BUN: 5 mg/dL — ABNORMAL LOW (ref 6–20)
CO2: 30 mmol/L (ref 22–32)
Calcium: 8.6 mg/dL — ABNORMAL LOW (ref 8.9–10.3)
Chloride: 96 mmol/L — ABNORMAL LOW (ref 98–111)
Creatinine, Ser: 0.68 mg/dL (ref 0.44–1.00)
GFR calc Af Amer: >60 mL/min (ref >60)
GFR calc non Af Amer: >60 mL/min (ref >60)
Glucose, Bld: 205 mg/dL — ABNORMAL HIGH (ref 70–99)
Potassium: 2.4 mmol/L — CL (ref 3.5–5.1)
Sodium: 137 mmol/L (ref 135–145)
Total Bilirubin: 0.4 mg/dL (ref 0.3–1.2)
Total Protein: 6.5 g/dL (ref 6.5–8.1)

## 2018-05-01 LAB — INFLUENZA PANEL BY PCR (TYPE A & B)
Influenza A By PCR: NEGATIVE
Influenza B By PCR: NEGATIVE

## 2018-05-01 LAB — CBC
HCT: 40.8 % (ref 36.0–46.0)
HCT: 38.6 % (ref 36.0–46.0)
Hemoglobin: 12.9 g/dL (ref 12.0–15.0)
Hemoglobin: 12.1 g/dL (ref 12.0–15.0)
MCH: 27.6 pg (ref 26.0–34.0)
MCH: 27.4 pg (ref 26.0–34.0)
MCHC: 31.6 g/dL (ref 30.0–36.0)
MCHC: 31.3 g/dL (ref 30.0–36.0)
MCV: 87.2 fL (ref 80.0–100.0)
MCV: 87.5 fL (ref 80.0–100.0)
Platelets: 366 10*3/uL (ref 150–400)
Platelets: 329 10*3/uL (ref 150–400)
RBC: 4.68 MIL/uL (ref 3.87–5.11)
RBC: 4.41 MIL/uL (ref 3.87–5.11)
RDW: 15 % (ref 11.5–15.5)
RDW: 15.1 % (ref 11.5–15.5)
WBC: 14.2 10*3/uL — ABNORMAL HIGH (ref 4.0–10.5)
WBC: 13.3 10*3/uL — ABNORMAL HIGH (ref 4.0–10.5)
nRBC: 0 % (ref 0.0–0.2)
nRBC: 0 % (ref 0.0–0.2)

## 2018-05-01 LAB — CREATININE, SERUM
Creatinine, Ser: 0.66 mg/dL (ref 0.44–1.00)
GFR calc Af Amer: >60 mL/min (ref >60)
GFR calc non Af Amer: >60 mL/min (ref >60)

## 2018-05-01 LAB — TROPONIN I: Troponin I: <0.03 ng/mL (ref <0.03)

## 2018-05-01 LAB — I-STAT BETA HCG BLOOD, ED (MC, WL, AP ONLY): I-stat hCG, quantitative: <5 m[IU]/mL (ref <5)

## 2018-05-01 LAB — LACTIC ACID, PLASMA
Lactic Acid, Venous: 2 mmol/L (ref 0.5–1.9)
Lactic Acid, Venous: 1.6 mmol/L (ref 0.5–1.9)

## 2018-05-01 LAB — D-DIMER, QUANTITATIVE: D-Dimer, Quant: 0.49 ug/mL-FEU (ref 0.00–0.50)

## 2018-05-01 LAB — BRAIN NATRIURETIC PEPTIDE: B Natriuretic Peptide: 40.9 pg/mL (ref 0.0–100.0)

## 2018-05-01 MED ORDER — CYCLOBENZAPRINE HCL 10 MG PO TABS: 10.0000 mg | ORAL_TABLET | Freq: Two times a day (BID) | ORAL | Status: DC | PRN

## 2018-05-01 MED ORDER — SODIUM CHLORIDE 0.9 % IV SOLN
1.0000 g | INTRAVENOUS | Status: DC
  Administered 2018-05-02: 02:00:00 1 g via INTRAVENOUS
  Filled 2018-05-01 (×2): qty 10

## 2018-05-01 MED ORDER — SODIUM CHLORIDE 0.9 % IV BOLUS
1000.0000 mL | Freq: Once | INTRAVENOUS | Status: AC
  Administered 2018-05-01: 1000 mL via INTRAVENOUS

## 2018-05-01 MED ORDER — PANTOPRAZOLE SODIUM 40 MG PO TBEC: 40.0000 mg | DELAYED_RELEASE_TABLET | Freq: Every day | ORAL | Status: DC

## 2018-05-01 MED ORDER — TRAZODONE HCL 100 MG PO TABS: 100.0000 mg | ORAL_TABLET | Freq: Every day | ORAL | Status: DC

## 2018-05-01 MED ORDER — ALBUTEROL SULFATE HFA 108 (90 BASE) MCG/ACT IN AERS
2.0000 | INHALATION_SPRAY | Freq: Four times a day (QID) | RESPIRATORY_TRACT | Status: DC | PRN
  Filled 2018-05-01: qty 6.7

## 2018-05-01 MED ORDER — SODIUM CHLORIDE 0.9 % IV SOLN
500.0000 mg | INTRAVENOUS | Status: DC
  Administered 2018-05-02: 01:00:00 500 mg via INTRAVENOUS
  Filled 2018-05-01 (×2): qty 500

## 2018-05-01 MED ORDER — CLONAZEPAM 1 MG PO TABS
1.0000 mg | ORAL_TABLET | Freq: Two times a day (BID) | ORAL | Status: DC | PRN
  Administered 2018-05-02: 0.5 mg via ORAL
  Filled 2018-05-01: qty 2

## 2018-05-01 MED ORDER — POTASSIUM CHLORIDE 10 MEQ/100ML IV SOLN
10.0000 meq | INTRAVENOUS | Status: AC
  Administered 2018-05-02 (×2): 10 meq via INTRAVENOUS
  Filled 2018-05-01 (×2): qty 100

## 2018-05-01 MED ORDER — GABAPENTIN 300 MG PO CAPS: 300.0000 mg | ORAL_CAPSULE | Freq: Two times a day (BID) | ORAL | Status: DC

## 2018-05-01 MED ORDER — DEXTROSE-NACL 5-0.9 % IV SOLN
INTRAVENOUS | Status: DC
  Administered 2018-05-02: 03:00:00 via INTRAVENOUS

## 2018-05-01 MED ORDER — GUAIFENESIN ER 600 MG PO TB12
600.0000 mg | ORAL_TABLET | Freq: Every day | ORAL | Status: DC | PRN
  Administered 2018-05-02: 02:00:00 600 mg via ORAL
  Filled 2018-05-01: qty 1

## 2018-05-01 MED ORDER — ENOXAPARIN SODIUM 40 MG/0.4ML ~~LOC~~ SOLN: 40.0000 mg | SUBCUTANEOUS | Status: DC

## 2018-05-01 MED ORDER — SODIUM CHLORIDE 0.9 % IV SOLN
2.0000 g | Freq: Once | INTRAVENOUS | Status: AC
  Administered 2018-05-01: 22:00:00 2 g via INTRAVENOUS
  Filled 2018-05-01: qty 2

## 2018-05-01 MED ORDER — VANCOMYCIN HCL 10 G IV SOLR
2000.0000 mg | Freq: Once | INTRAVENOUS | Status: AC
  Administered 2018-05-01: 23:00:00 2000 mg via INTRAVENOUS
  Filled 2018-05-01: qty 2000

## 2018-05-01 MED ORDER — LURASIDONE HCL 40 MG PO TABS
80.0000 mg | ORAL_TABLET | Freq: Every day | ORAL | Status: DC
  Filled 2018-05-01: qty 2

## 2018-05-01 MED ORDER — ESCITALOPRAM OXALATE 20 MG PO TABS: 20.0000 mg | ORAL_TABLET | Freq: Every day | ORAL | Status: DC

## 2018-05-01 MED ORDER — ALBUTEROL SULFATE HFA 108 (90 BASE) MCG/ACT IN AERS
8.0000 | INHALATION_SPRAY | Freq: Once | RESPIRATORY_TRACT | Status: AC
  Administered 2018-05-01: 22:00:00 8 via RESPIRATORY_TRACT
  Filled 2018-05-01: qty 6.7

## 2018-05-01 MED ORDER — BECLOMETHASONE DIPROP HFA 80 MCG/ACT IN AERB
2.0000 | INHALATION_SPRAY | Freq: Two times a day (BID) | RESPIRATORY_TRACT | Status: DC
  Filled 2018-05-01: qty 10.6

## 2018-05-01 NOTE — ED Triage Notes (Signed)
Patient reports worsening SOB with chest congestion and productive cough this week , history of COPD , pt. added central chest tightness/ pain . Patient stated fever today took Tylenol prior to arrival . Denies travel history or sick contact .

## 2018-05-01 NOTE — H&P (Signed)
History and Physical   Taylore Hinde Esguerra ZOX:[id number] DOB: May 03, 1980 DOA: 05/01/2018  Referring MD/NP/PA: Arthor Captain, PA  PCP: Rometta Emery, MD   Outpatient Specialists: None  Patient coming from: Home  Chief Complaint: Fever shortness of breath  HPI: [name] is a 38 y.o. female with medical history significant of COPD, bipolar disorder, hypertension, obstructive sleep apnea not yet on  CPAP depression with anxiety, chronic pain syndrome who came in from home with fever up to 102 and shortness of breath.  Patient has been having respiratory symptoms for about 2 weeks.  She was coughing brown sputum with some bloody streaks within the last week.  She also had 1 of her sons who is equally having respiratory symptoms coughing and having fever in the ER.  1 of patient's siblings is currently quarantined with suspected COVID-19 already on treatment with Plaquenil.Denied any chest pain.  Denied any nausea vomiting or diarrhea.  She has inhalers and nebulizers at home which she has been using.  She is also getting various cough medication that she has been trying.  Patient is being admitted with acute respiratory failure due to new oxygen requirement of 2 L at the moment.  Chest x-ray showed 5 has been negative.  Suspected COVID-19 and is being admitted with droplet isolation.  ED Course: Temperature is 99.9 blood pressure 98/54 pulse 112 respiratory rate of 21 oxygen sats 93% room air.  White count is 14.2 potassium 2.4 chloride 96 lactic acid 2.1 calcium of 8.6 glucose 205. BNP of 40.9.  Blood cultures have been obtained and patient is being admitted for treatment of acute respiratory failure possibly COVID-19  Review of Systems: As per HPI otherwise 10 point review of systems negative.    Past Medical History:  Diagnosis Date  . Anxiety   . Arthritis   . Asthma   . Bipolar 1 disorder (HCC)   . COPD (chronic obstructive pulmonary disease) (HCC)   . Depression   .  Emphysema   . Obesity   . Pneumonia   . Rib fracture     Past Surgical History:  Procedure Laterality Date  . CHOLECYSTECTOMY    . TUBAL LIGATION       reports that she quit smoking about 6 years ago. She smoked 0.00 packs per day. She has never used smokeless tobacco. She reports that she does not drink alcohol or use drugs.  Allergies  Allergen Reactions  . Iohexol Cough    No family history on file.   Prior to Admission medications   Medication Sig Start Date End Date Taking? Authorizing Provider  albuterol (PROVENTIL HFA;VENTOLIN HFA) 108 (90 Base) MCG/ACT inhaler Inhale 2 puffs into the lungs every 6 (six) hours as needed. For shortness of breath. 07/05/16  Yes Kathlen Mody, MD  beclomethasone (QVAR) 80 MCG/ACT inhaler Inhale 2 puffs into the lungs 2 (two) times daily.   Yes [provider]  clonazePAM (KLONOPIN) 0.5 MG tablet Take 2 tablets (1 mg total) by mouth 2 (two) times daily as needed for anxiety (take  every am and .  every pm ). 01/29/11 05/01/18 Yes Jethro Bastos, NP  cyclobenzaprine (FLEXERIL) 10 MG tablet Take 1 tablet (10 mg total) by mouth 2 (two) times daily as needed for muscle spasms. 01/20/18  Yes Roxan Hockey, [place] N, PA-C  escitalopram (LEXAPRO) 20 MG tablet Take 20 mg by mouth daily.     Yes [provider]  gabapentin (NEURONTIN) 300 MG capsule Take 300 mg by  mouth 2 (two) times daily.    Yes [provider]  guaiFENesin (MUCINEX) 600 MG 12 hr tablet Take 600 mg by mouth daily as needed for cough or to loosen phlegm.   Yes [provider]  ipratropium-albuterol (DUONEB) 0.5-2.5 (3) MG/3ML SOLN Take 3 mLs by nebulization every 4 (four) hours as needed. Patient taking differently: Take 3 mLs by nebulization every 4 (four) hours as needed (shortness of breath).  07/05/16  Yes Kathlen Mody, MD  lurasidone (LATUDA) 80 MG TABS tablet Take 80 mg by mouth daily with breakfast.   Yes [provider]  omeprazole  (PRILOSEC) 40 MG capsule Take 40 mg by mouth daily. 04/04/18  Yes [provider]  traZODone (DESYREL) 50 MG tablet Take 100 mg by mouth daily.  03/30/18  Yes [provider]  HYDROcodone-homatropine (HYCODAN) 5-1.5 MG/5ML syrup Take 5 mLs by mouth every 6 (six) hours as needed for cough. Patient not taking: Reported on 01/22/2015 09/01/14   Mady Gemma, [place]    Physical Exam: Vitals:   05/01/18 1953 05/01/18 2100 05/01/18 2117 05/01/18 2130  BP:  100/76  103/74  Pulse:  (!) 103  (!) 101  Resp:  16  (!) 21  Temp:   99.9 F (37.7 C)   TempSrc:   Rectal   SpO2:  94%  96%  Weight: (!) 167.8 kg     Height: 5\' 6"  (1.676 m)         Constitutional: NAD, very anxious comfortable, morbidly obese in mild distress Vitals:   05/01/18 1953 05/01/18 2100 05/01/18 2117 05/01/18 2130  BP:  100/76  103/74  Pulse:  (!) 103  (!) 101  Resp:  16  (!) 21  Temp:   99.9 F (37.7 C)   TempSrc:   Rectal   SpO2:  94%  96%  Weight: (!) 167.8 kg     Height: 5\' 6"  (1.676 m)      Eyes: PERRL, lids and conjunctivae normal ENMT: Mucous membranes are moist. Posterior pharynx clear of any exudate or lesions.Normal dentition.  Neck: normal, supple, no masses, no thyromegaly Respiratory: Coarse breath sounds bilaterally, mild expiratory wheezing no crackles. Normal respiratory effort. No accessory muscle use.  Cardiovascular: Regular rate and rhythm, no murmurs / rubs / gallops. No extremity edema. 2+ pedal pulses. No carotid bruits.  Abdomen: no tenderness, no masses palpated. No hepatosplenomegaly. Bowel sounds positive.  Musculoskeletal: no clubbing / cyanosis. No joint deformity upper and lower extremities. Good ROM, no contractures. Normal muscle tone.  Skin: no rashes, lesions, ulcers. No induration Neurologic: CN 2-12 grossly intact. Sensation intact, DTR normal. Strength 5/5 in all 4.  Psychiatric: Normal judgment and insight. Alert and oriented x 3. Normal mood.     Labs on  Admission: I have personally reviewed following labs and imaging studies  CBC: Recent Labs  Lab 05/01/18 2003  WBC 14.2*  HGB 12.9  HCT 40.8  MCV 87.2  PLT 366   Basic Metabolic Panel: Recent Labs  Lab 05/01/18 2043  NA 137  K 2.4*  CL 96*  CO2 30  GLUCOSE 205*  BUN 5*  CREATININE 0.68  CALCIUM 8.6*   GFR: Estimated Creatinine Clearance: 154.6 mL/min (by C-G formula based on SCr of 0.68 mg/dL). Liver Function Tests: Recent Labs  Lab 05/01/18 2043  AST 17  ALT 19  ALKPHOS 78  BILITOT 0.4  PROT 6.5  ALBUMIN 3.2*   No results for input(s): LIPASE, AMYLASE in the last 168 hours.  No results for input(s): AMMONIA in the last 168 hours. Coagulation Profile: No results for input(s): INR, PROTIME in the last 168 hours. Cardiac Enzymes: Recent Labs  Lab 05/01/18 2003  TROPONINI <0.03   BNP (last 3 results) No results for input(s): PROBNP in the last 8760 hours. HbA1C: No results for input(s): HGBA1C in the last 72 hours. CBG: No results for input(s): GLUCAP in the last 168 hours. Lipid Profile: No results for input(s): CHOL, HDL, LDLCALC, TRIG, CHOLHDL, LDLDIRECT in the last 72 hours. Thyroid Function Tests: No results for input(s): TSH, T4TOTAL, FREET4, T3FREE, THYROIDAB in the last 72 hours. Anemia Panel: No results for input(s): VITAMINB12, FOLATE, FERRITIN, TIBC, IRON, RETICCTPCT in the last 72 hours. Urine analysis:    Component Value Date/Time   COLORURINE RED (A) 07/05/2016 0630   APPEARANCEUR HAZY (A) 07/05/2016 0630   LABSPEC 1.012 07/05/2016 0630   PHURINE 6.0 07/05/2016 0630   GLUCOSEU >=500 (A) 07/05/2016 0630   HGBUR LARGE (A) 07/05/2016 0630   BILIRUBINUR NEGATIVE 07/05/2016 0630   KETONESUR 20 (A) 07/05/2016 0630   PROTEINUR 30 (A) 07/05/2016 0630   UROBILINOGEN 0.2 01/22/2015 1523   NITRITE NEGATIVE 07/05/2016 0630   LEUKOCYTESUR TRACE (A) 07/05/2016 0630   Sepsis Labs: (procalcitonin:4,lacticidven:4) )No results found for  this or any previous visit (from the past 240 hour(s)).   Radiological Exams on Admission: Dg Chest 2 View  Result Date: 05/01/2018 CLINICAL DATA:  Chest pain and shortness of breath EXAM: CHEST - 2 VIEW COMPARISON:  July 04, 2016 FINDINGS: No edema or consolidation. Heart is upper normal in size with pulmonary vascularity normal. No adenopathy. No pneumothorax. No bone lesions. IMPRESSION: No edema or consolidation.  Heart upper normal in size. Electronically Signed   By: Bretta Bang III M.D.   On: 05/01/2018 20:44    Assessment/Plan Principal Problem:   Acute respiratory failure with hypoxia (HCC) Active Problems:   COPD (chronic obstructive pulmonary disease) (HCC)   OSA (obstructive sleep apnea)   Hypokalemia   Lactic acidosis   Acute on chronic respiratory failure with hypoxemia (HCC)     #1 acute respiratory failure with hypoxia: Patient has new oxygen requirement.  Has fever and tachypnea consistent with early sepsis.  Probably infectious etiology.  It could be early pneumonia or worsening bronchitis.  Very suspicious for the noble COVID-19 virus.  We will admit for rule out.  Empirically treat for bronchitis.  No nebulizer but inhalers.  May consider empiric antibiotics.  #2 COPD: Mild exacerbation.  Continue treatment for exacerbation.  #3 hypokalemia: Replete potassium aggressively.  #4 lactic acidosis: Probably secondary to sepsis.  #5 obstructive sleep apnea: Patient is in the process of obtaining CPAP.  #6 hyperglycemia: Not in nondiabetic.  Check hemoglobin A1c.   DVT prophylaxis: Lovenox Code Status: Full code Family Communication: Daughter and son Disposition Plan: Home Consults called: None Admission status: Inpatient  Severity of Illness: The appropriate patient status for this patient is INPATIENT. Inpatient status is judged to be reasonable and necessary in order to provide the required intensity of service to ensure the patient's safety. The  patient's presenting symptoms, physical exam findings, and initial radiographic and laboratory data in the context of their chronic comorbidities is felt to place them at high risk for further clinical deterioration. Furthermore, it is not anticipated that the patient will be medically stable for discharge from the hospital within 2 midnights of admission. The following factors support the patient status of inpatient.   " The patient's  presenting symptoms include fever shortness of breath. " The worrisome physical exam findings include coarse breath sounds with mild wheezing. " The initial radiographic and laboratory data are worrisome because of no significant findings on x-ray but leukocytosis and hypokalemia. " The chronic co-morbidities include hypertension morbid obesity.   * I certify that at the point of admission it is my clinical judgment that the patient will require inpatient hospital care spanning beyond 2 midnights from the point of admission due to high intensity of service, high risk for further deterioration and high frequency of surveillance required.Lonia Blood*    Sallye Lunz,LAWAL MD [facility] Pager [phone]  If 7PM-7AM, please contact night-coverage [email or web address] Password [email or web address]  05/01/2018, 10:17 PM

## 2018-05-01 NOTE — ED Provider Notes (Signed)
[facility] EMERGENCY DEPARTMENT Provider Note   CSN: [id number] Arrival date & time: 05/01/18  1937    History   Chief Complaint Chief Complaint  Patient presents with  . COPD  . Shortness of Breath    HPI [name] is a 38 y.o. female who presents for fever and sob.  She is a past medical history of morbid obesity, obstructive sleep apnea, COPD.  She had onset of productive cough with brown sputum and intermittent streaky hemoptysis starting 2 days ago.  She developed sore throat, hoarse voice, anosmia and aguesia. She has been using her inhaler at home without relief. She also developed fever up to 101 at home and took tylenol PTA. Of note her sister, with whom she last had interaction about a week and a half ago is currently a person under investigation on home quarantine for COVID-19 and is on both Plaquenil and azithromycin.  The patient also has an adult child present in the ER with similar symptoms and fever.  Patient denies nausea vomiting diarrhea.  She has not had any foreign travel.      COPD   Shortness of Breath    Past Medical History:  Diagnosis Date  . Anxiety   . Arthritis   . Asthma   . Bipolar 1 disorder (HCC)   . COPD (chronic obstructive pulmonary disease) (HCC)   . Depression   . Emphysema   . Obesity   . Pneumonia   . Rib fracture     Patient Active Problem List   Diagnosis Date Noted  . OSA (obstructive sleep apnea) 05/01/2018  . Hypokalemia 05/01/2018  . Lactic acidosis 05/01/2018  . Acute on chronic respiratory failure with hypoxemia (HCC) 05/01/2018  . Sepsis (HCC) 05/01/2018  . COPD (chronic obstructive pulmonary disease) (HCC) 07/04/2016  . Acute respiratory failure with hypoxia (HCC) 07/04/2016    Past Surgical History:  Procedure Laterality Date  . CHOLECYSTECTOMY    . TUBAL LIGATION       OB History    Gravida  6   Para  3   Term  3   Preterm  0   AB  3   Living  3     SAB  3   TAB   0   Ectopic  0   Multiple  0   Live Births               Home Medications    Prior to Admission medications   Medication Sig Start Date End Date Taking? Authorizing Provider  albuterol (PROVENTIL HFA;VENTOLIN HFA) 108 (90 Base) MCG/ACT inhaler Inhale 2 puffs into the lungs every 6 (six) hours as needed. For shortness of breath. 07/05/16  Yes Kathlen Mody, MD  beclomethasone (QVAR) 80 MCG/ACT inhaler Inhale 2 puffs into the lungs 2 (two) times daily.   Yes [provider]  clonazePAM (KLONOPIN) 0.5 MG tablet Take 2 tablets (1 mg total) by mouth 2 (two) times daily as needed for anxiety (take  every am and .  every pm ). 01/29/11 05/01/18 Yes Jethro Bastos, NP  cyclobenzaprine (FLEXERIL) 10 MG tablet Take 1 tablet (10 mg total) by mouth 2 (two) times daily as needed for muscle spasms. 01/20/18  Yes Roxan Hockey, [place] N, PA-C  escitalopram (LEXAPRO) 20 MG tablet Take 20 mg by mouth daily.     Yes [provider]  gabapentin (NEURONTIN) 300 MG capsule Take 300 mg by mouth 2 (two) times daily.  Yes [provider]  guaiFENesin (MUCINEX) 600 MG 12 hr tablet Take 600 mg by mouth daily as needed for cough or to loosen phlegm.   Yes [provider]  ipratropium-albuterol (DUONEB) 0.5-2.5 (3) MG/3ML SOLN Take 3 mLs by nebulization every 4 (four) hours as needed. Patient taking differently: Take 3 mLs by nebulization every 4 (four) hours as needed (shortness of breath).  07/05/16  Yes Kathlen Mody, MD  lurasidone (LATUDA) 80 MG TABS tablet Take 80 mg by mouth daily with breakfast.   Yes [provider]  omeprazole (PRILOSEC) 40 MG capsule Take 40 mg by mouth daily. 04/04/18  Yes [provider]  traZODone (DESYREL) 50 MG tablet Take 100 mg by mouth daily.  03/30/18  Yes [provider]  HYDROcodone-homatropine (HYCODAN) 5-1.5 MG/5ML syrup Take 5 mLs by mouth every 6 (six) hours as needed for cough. Patient not taking: Reported on  01/22/2015 09/01/14   Mady Gemma, PA-C    Family History No family history on file.  Social History Social History   Tobacco Use  . Smoking status: Former Smoker    Packs/day: 0.00    Last attempt to quit: 05/07/2011    Years since quitting: 6.9  . Smokeless tobacco: Never Used  Substance Use Topics  . Alcohol use: No  . Drug use: No     Allergies   Iohexol   Review of Systems Review of Systems  Ten systems reviewed and are negative for acute change, except as noted in the HPI.   Physical Exam Updated Vital Signs BP 104/68   Pulse (!) 105   Temp 99.9 F (37.7 C) (Rectal)   Resp (!) 22   Ht  (1.676 m)   Wt (!) 167.8 kg   LMP 04/17/2018 (Approximate)   SpO2 96%   BMI 59.72 kg/m   Physical Exam Vitals signs and nursing note reviewed.  Constitutional:      Appearance: She is obese. She is ill-appearing and toxic-appearing.     Interventions: Nasal cannula in place.     Comments: Patient on 2 L via nasal cannula.  Hypoxic in the high 80s without oxygen supplementation  HENT:     Head: Normocephalic and atraumatic.     Mouth/Throat:     Mouth: Mucous membranes are moist.  Eyes:     Extraocular Movements: Extraocular movements intact.  Neck:     Musculoskeletal: Normal range of motion.     Vascular: No JVD.  Cardiovascular:     Rate and Rhythm: Tachycardia present.  Pulmonary:     Effort: Tachypnea present.     Comments: Patient with labored breathing, tachypnea.  Breath sounds are distant and difficult to hear secondary to body habitus Abdominal:     Tenderness: There is no abdominal tenderness.  Skin:    Findings: Rash present.     Comments: Rash over the BL breasts, erythematous, tender and vesicular  Neurological:     General: No focal deficit present.     Mental Status: She is alert.      ED Treatments / Results  Labs (all labs ordered are listed, but only abnormal results are displayed) Labs Reviewed  CBC - Abnormal; Notable  for the following components:      Result Value   WBC 14.2 (*)    All other components within normal limits  LACTIC ACID, PLASMA - Abnormal; Notable for the following components:   Lactic Acid, Venous 2.0 (*)    All other components  within normal limits  COMPREHENSIVE METABOLIC PANEL - Abnormal; Notable for the following components:   Potassium 2.4 (*)    Chloride 96 (*)    Glucose, Bld 205 (*)    BUN 5 (*)    Calcium 8.6 (*)    Albumin 3.2 (*)    All other components within normal limits  CBC - Abnormal; Notable for the following components:   WBC 13.3 (*)    All other components within normal limits  SEDIMENTATION RATE - Abnormal; Notable for the following components:   Sed Rate 23 (*)    All other components within normal limits  LACTATE DEHYDROGENASE - Abnormal; Notable for the following components:   LDH 228 (*)    All other components within normal limits  CULTURE, BLOOD (ROUTINE X 2)  CULTURE, BLOOD (ROUTINE X 2)  NOVEL CORONAVIRUS, NAA (HOSPITAL ORDER, SEND-OUT TO REF LAB)  RESPIRATORY PANEL BY PCR  TROPONIN I  LACTIC ACID, PLASMA  BRAIN NATRIURETIC PEPTIDE  INFLUENZA PANEL BY PCR (TYPE A & B)  D-DIMER, QUANTITATIVE (NOT AT [facility])  CREATININE, SERUM  FERRITIN  URINALYSIS, ROUTINE W REFLEX MICROSCOPIC  PROCALCITONIN  COMPREHENSIVE METABOLIC PANEL  CBC WITH DIFFERENTIAL/PLATELET  I-STAT BETA HCG BLOOD, ED ([facility], [facility], [facility] ONLY)    EKG None  Radiology Dg Chest 2 View  Result Date: 05/01/2018 CLINICAL DATA:  Chest pain and shortness of breath EXAM: CHEST - 2 VIEW COMPARISON:  July 04, 2016 FINDINGS: No edema or consolidation. Heart is upper normal in size with pulmonary vascularity normal. No adenopathy. No pneumothorax. No bone lesions. IMPRESSION: No edema or consolidation.  Heart upper normal in size. Electronically Signed   By: Bretta BangWilliam  Woodruff III M.D.   On: 05/01/2018 20:44    Procedures Procedures (including critical care time)  Medications Ordered in ED  Medications  vancomycin (VANCOCIN) 2,000 mg in sodium chloride 0.9 % 500 mL IVPB (2,000 mg Intravenous New Bag/Given 05/01/18 2328)  potassium chloride 10 mEq in 100 mL IVPB (has no administration in time range)  gabapentin (NEURONTIN) capsule 300 mg (300 mg Oral Not Given 05/02/18 0031)  escitalopram (LEXAPRO) tablet 20 mg (has no administration in time range)  clonazePAM (KLONOPIN) tablet 1 mg (0.5 mg Oral Given 05/02/18 0030)  beclomethasone (QVAR) 80 MCG/ACT inhaler 2 puff (has no administration in time range)  lurasidone (LATUDA) tablet 80 mg (has no administration in time range)  albuterol (PROVENTIL HFA;VENTOLIN HFA) 108 (90 Base) MCG/ACT inhaler 2 puff (has no administration in time range)  cyclobenzaprine (FLEXERIL) tablet 10 mg (has no administration in time range)  pantoprazole (PROTONIX) EC tablet 40 mg (has no administration in time range)  traZODone (DESYREL) tablet 100 mg (has no administration in time range)  guaiFENesin (MUCINEX) 12 hr tablet 600 mg (has no administration in time range)  enoxaparin (LOVENOX) injection 40 mg (has no administration in time range)  dextrose 5 %-0.9 % sodium chloride infusion (has no administration in time range)  azithromycin (ZITHROMAX) 500 mg in sodium chloride 0.9 % 250 mL IVPB (has no administration in time range)  cefTRIAXone (ROCEPHIN) 1 g in sodium chloride 0.9 % 100 mL IVPB (has no administration in time range)  sodium chloride 0.9 % bolus 1,000 mL (1,000 mLs Intravenous New Bag/Given 05/01/18 2121)  ceFEPIme (MAXIPIME) 2 g in sodium chloride 0.9 % 100 mL IVPB (0 g Intravenous Stopped 05/01/18 2246)  albuterol (PROVENTIL HFA;VENTOLIN HFA) 108 (90 Base) MCG/ACT inhaler 8 puff (8 puffs Inhalation Given 05/01/18 2216)  Initial Impression / Assessment and Plan / ED Course  I have reviewed the triage vital signs and the nursing notes.  Pertinent labs & imaging results that were available during my care of the patient were reviewed by me and  considered in my medical decision making (see chart for details).       [name] was evaluated in Emergency Department on 05/02/2018 for the symptoms described in the history of present illness. She was evaluated in the context of the global COVID-19 pandemic, which necessitated consideration that the patient might be at risk for infection with the SARS-CoV-2 virus that causes COVID-19. Institutional protocols and algorithms that pertain to the evaluation of patients at risk for COVID-19 are in a state of rapid change based on information released by regulatory bodies including the CDC and federal and state organizations. These policies and algorithms were followed during the patient's care in the ED.  CC: Shortness of breath and fever VS: BP 104/68   Pulse (!) 105   Temp 99.9 F (37.7 C) (Rectal)   Resp (!) 22   Ht 5\' 6"  (1.676 m)   Wt (!) 167.8 kg   LMP 04/17/2018 (Approximate)   SpO2 96%   BMI 59.72 kg/m  TK:PTWSFKC is gathered by the patient.  And review of EMR. DDX:The emergent differential diagnosis for shortness of breath includes, but is not limited to, Pulmonary edema, bronchoconstriction, Pneumonia, Pulmonary embolism, Pneumotherax/ Hemothorax, Dysrythmia, ACS,  Labs: I reviewed the labs which show elevated lactic acid, elevated white blood cell count, hypokalemia.  Patient's BNP is negative however this is also in the setting of morbid obesity which may decrease its value.  Patient has a negative d-dimer. Imaging: I personally reviewed the images (patient's PA and lateral chest x-ray) which show(s) no consolidations or infiltrates EKG: Pending MDM: Patient here with fever, tachycardia, hypoxia, hypotension and elevated white blood cell count.  She was started on the sepsis protocol  Patient is also under consideration for novel coronavirus and all precautions have been taken. Patient disposition: Admit Patient condition: Stable. The patient appears reasonably stabilized  for admission considering the current resources, flow, and capabilities available in the ED at this time, and I doubt any other [facility] requiring further screening and/or treatment in the ED prior to admission.   Final Clinical Impressions(s) / ED Diagnoses   Final diagnoses:  None    ED Discharge Orders    None       Arthor Captain, PA-C 05/02/18 0037    Virgina Norfolk, DO 05/02/18 1458

## 2018-05-02 ENCOUNTER — Emergency Department (HOSPITAL_COMMUNITY): Payer: Medicare Other

## 2018-05-02 ENCOUNTER — Inpatient Hospital Stay (HOSPITAL_COMMUNITY)
Admission: EM | Admit: 2018-05-02 | Discharge: 2018-05-03 | Disposition: A | Discharge status: Home / Self Care | Payer: Medicare Other | Source: Home / Self Care | Attending: Internal Medicine | Admitting: Internal Medicine

## 2018-05-02 ENCOUNTER — Other Ambulatory Visit: Payer: Self-pay

## 2018-05-02 ENCOUNTER — Encounter (HOSPITAL_COMMUNITY): Payer: Self-pay | Admitting: *Deleted

## 2018-05-02 ENCOUNTER — Inpatient Hospital Stay (HOSPITAL_COMMUNITY): Payer: Medicare Other

## 2018-05-02 DIAGNOSIS — A419 Sepsis, unspecified organism: Secondary | ICD-10-CM

## 2018-05-02 DIAGNOSIS — E1169 Type 2 diabetes mellitus with other specified complication: Secondary | ICD-10-CM

## 2018-05-02 DIAGNOSIS — J449 Chronic obstructive pulmonary disease, unspecified: Secondary | ICD-10-CM | POA: Diagnosis present

## 2018-05-02 DIAGNOSIS — E872 Acidosis, unspecified: Secondary | ICD-10-CM | POA: Diagnosis present

## 2018-05-02 DIAGNOSIS — J9621 Acute and chronic respiratory failure with hypoxia: Secondary | ICD-10-CM

## 2018-05-02 DIAGNOSIS — F319 Bipolar disorder, unspecified: Secondary | ICD-10-CM

## 2018-05-02 DIAGNOSIS — J069 Acute upper respiratory infection, unspecified: Secondary | ICD-10-CM

## 2018-05-02 DIAGNOSIS — J9601 Acute respiratory failure with hypoxia: Secondary | ICD-10-CM | POA: Diagnosis present

## 2018-05-02 DIAGNOSIS — E876 Hypokalemia: Secondary | ICD-10-CM | POA: Diagnosis present

## 2018-05-02 DIAGNOSIS — J410 Simple chronic bronchitis: Secondary | ICD-10-CM

## 2018-05-02 DIAGNOSIS — Z20822 Contact with and (suspected) exposure to covid-19: Secondary | ICD-10-CM

## 2018-05-02 DIAGNOSIS — G4733 Obstructive sleep apnea (adult) (pediatric): Secondary | ICD-10-CM | POA: Diagnosis present

## 2018-05-02 DIAGNOSIS — E669 Obesity, unspecified: Secondary | ICD-10-CM

## 2018-05-02 DIAGNOSIS — R6889 Other general symptoms and signs: Secondary | ICD-10-CM

## 2018-05-02 LAB — CBC WITH DIFFERENTIAL/PLATELET
Abs Immature Granulocytes: 0.05 10*3/uL (ref 0.00–0.07)
Abs Immature Granulocytes: 0.06 10*3/uL (ref 0.00–0.07)
Basophils Absolute: 0 10*3/uL (ref 0.0–0.1)
Basophils Absolute: 0 10*3/uL (ref 0.0–0.1)
Basophils Relative: 0 %
Basophils Relative: 0 %
Eosinophils Absolute: 0.3 10*3/uL (ref 0.0–0.5)
Eosinophils Absolute: 0.5 10*3/uL (ref 0.0–0.5)
Eosinophils Relative: 2 %
Eosinophils Relative: 3 %
HCT: 38.1 % (ref 36.0–46.0)
HCT: 38.5 % (ref 36.0–46.0)
Hemoglobin: 12 g/dL (ref 12.0–15.0)
Hemoglobin: 12.4 g/dL (ref 12.0–15.0)
Immature Granulocytes: 0 %
Immature Granulocytes: 0 %
Lymphocytes Relative: 19 %
Lymphocytes Relative: 20 %
Lymphs Abs: 2.2 10*3/uL (ref 0.7–4.0)
Lymphs Abs: 2.9 10*3/uL (ref 0.7–4.0)
MCH: 27.5 pg (ref 26.0–34.0)
MCH: 28.4 pg (ref 26.0–34.0)
MCHC: 31.5 g/dL (ref 30.0–36.0)
MCHC: 32.2 g/dL (ref 30.0–36.0)
MCV: 87.2 fL (ref 80.0–100.0)
MCV: 88.3 fL (ref 80.0–100.0)
Monocytes Absolute: 0.7 10*3/uL (ref 0.1–1.0)
Monocytes Absolute: 1 10*3/uL (ref 0.1–1.0)
Monocytes Relative: 6 %
Monocytes Relative: 7 %
Neutro Abs: 8.8 10*3/uL — ABNORMAL HIGH (ref 1.7–7.7)
Neutro Abs: 9.8 10*3/uL — ABNORMAL HIGH (ref 1.7–7.7)
Neutrophils Relative %: 73 %
Neutrophils Relative %: 70 %
Platelets: 328 10*3/uL (ref 150–400)
Platelets: 328 10*3/uL (ref 150–400)
RBC: 4.37 MIL/uL (ref 3.87–5.11)
RBC: 4.36 MIL/uL (ref 3.87–5.11)
RDW: 15.1 % (ref 11.5–15.5)
RDW: 15.2 % (ref 11.5–15.5)
WBC: 12 10*3/uL — ABNORMAL HIGH (ref 4.0–10.5)
WBC: 14.2 10*3/uL — ABNORMAL HIGH (ref 4.0–10.5)
nRBC: 0 % (ref 0.0–0.2)
nRBC: 0 % (ref 0.0–0.2)

## 2018-05-02 LAB — BASIC METABOLIC PANEL
Anion gap: 12 (ref 5–15)
BUN: <5 mg/dL — ABNORMAL LOW (ref 6–20)
CO2: 27 mmol/L (ref 22–32)
Calcium: 8.5 mg/dL — ABNORMAL LOW (ref 8.9–10.3)
Chloride: 100 mmol/L (ref 98–111)
Creatinine, Ser: 0.64 mg/dL (ref 0.44–1.00)
GFR calc Af Amer: >60 mL/min (ref >60)
GFR calc non Af Amer: >60 mL/min (ref >60)
Glucose, Bld: 143 mg/dL — ABNORMAL HIGH (ref 70–99)
Potassium: 2.6 mmol/L — CL (ref 3.5–5.1)
Sodium: 139 mmol/L (ref 135–145)

## 2018-05-02 LAB — LACTIC ACID, PLASMA
Lactic Acid, Venous: 1.3 mmol/L (ref 0.5–1.9)
Lactic Acid, Venous: 1.3 mmol/L (ref 0.5–1.9)

## 2018-05-02 LAB — COMPREHENSIVE METABOLIC PANEL
ALT: 17 U/L (ref 0–44)
AST: 14 U/L — ABNORMAL LOW (ref 15–41)
Albumin: 2.9 g/dL — ABNORMAL LOW (ref 3.5–5.0)
Alkaline Phosphatase: 76 U/L (ref 38–126)
Anion gap: 10 (ref 5–15)
BUN: 5 mg/dL — ABNORMAL LOW (ref 6–20)
CO2: 28 mmol/L (ref 22–32)
Calcium: 8.2 mg/dL — ABNORMAL LOW (ref 8.9–10.3)
Chloride: 101 mmol/L (ref 98–111)
Creatinine, Ser: 0.64 mg/dL (ref 0.44–1.00)
GFR calc Af Amer: >60 mL/min (ref >60)
GFR calc non Af Amer: >60 mL/min (ref >60)
Glucose, Bld: 203 mg/dL — ABNORMAL HIGH (ref 70–99)
Potassium: 2.4 mmol/L — CL (ref 3.5–5.1)
Sodium: 139 mmol/L (ref 135–145)
Total Bilirubin: 0.3 mg/dL (ref 0.3–1.2)
Total Protein: 5.8 g/dL — ABNORMAL LOW (ref 6.5–8.1)

## 2018-05-02 LAB — RESPIRATORY PANEL BY PCR
Adenovirus: NOT DETECTED
Bordetella pertussis: NOT DETECTED
Chlamydophila pneumoniae: NOT DETECTED
Coronavirus 229E: NOT DETECTED
Coronavirus HKU1: NOT DETECTED
Coronavirus NL63: NOT DETECTED
Coronavirus OC43: NOT DETECTED
Influenza A: NOT DETECTED
Influenza B: NOT DETECTED
Metapneumovirus: NOT DETECTED
Mycoplasma pneumoniae: NOT DETECTED
Parainfluenza Virus 1: NOT DETECTED
Parainfluenza Virus 2: NOT DETECTED
Parainfluenza Virus 3: NOT DETECTED
Parainfluenza Virus 4: DETECTED — AB
Respiratory Syncytial Virus: NOT DETECTED
Rhinovirus / Enterovirus: NOT DETECTED

## 2018-05-02 LAB — PROCALCITONIN: Procalcitonin: <0.1 ng/mL

## 2018-05-02 LAB — FERRITIN: Ferritin: 27 ng/mL (ref 11–307)

## 2018-05-02 LAB — HEMOGLOBIN A1C
Hgb A1c MFr Bld: 8.4 % — ABNORMAL HIGH (ref 4.8–5.6)
Mean Plasma Glucose: 194.38 mg/dL

## 2018-05-02 LAB — SEDIMENTATION RATE: Sed Rate: 23 mm/hr — ABNORMAL HIGH (ref 0–22)

## 2018-05-02 LAB — LACTATE DEHYDROGENASE: LDH: 228 U/L — ABNORMAL HIGH (ref 98–192)

## 2018-05-02 MED ORDER — ESCITALOPRAM OXALATE 10 MG PO TABS
20.0000 mg | ORAL_TABLET | Freq: Every day | ORAL | Status: DC
  Filled 2018-05-02: qty 2

## 2018-05-02 MED ORDER — SODIUM CHLORIDE 0.9 % IV SOLN
1.0000 g | INTRAVENOUS | Status: DC
  Administered 2018-05-03: 1 g via INTRAVENOUS
  Filled 2018-05-02 (×2): qty 10

## 2018-05-02 MED ORDER — POTASSIUM CHLORIDE 20 MEQ PO PACK
40.0000 meq | PACK | ORAL | Status: AC
  Administered 2018-05-02 – 2018-05-03 (×2): 40 meq via ORAL
  Filled 2018-05-02 (×2): qty 2

## 2018-05-02 MED ORDER — ENOXAPARIN SODIUM 80 MG/0.8ML ~~LOC~~ SOLN
80.0000 mg | SUBCUTANEOUS | Status: DC
  Administered 2018-05-02: 80 mg via SUBCUTANEOUS
  Filled 2018-05-02: qty 0.8

## 2018-05-02 MED ORDER — ALBUTEROL SULFATE HFA 108 (90 BASE) MCG/ACT IN AERS
10.0000 | INHALATION_SPRAY | Freq: Once | RESPIRATORY_TRACT | Status: AC
  Administered 2018-05-02: 10 via RESPIRATORY_TRACT
  Filled 2018-05-02: qty 6.7

## 2018-05-02 MED ORDER — ACETAMINOPHEN 325 MG PO TABS
650.0000 mg | ORAL_TABLET | Freq: Once | ORAL | Status: AC
  Administered 2018-05-02: 650 mg via ORAL
  Filled 2018-05-02: qty 2

## 2018-05-02 MED ORDER — LURASIDONE HCL 40 MG PO TABS
80.0000 mg | ORAL_TABLET | Freq: Every day | ORAL | Status: DC
  Filled 2018-05-02: qty 2

## 2018-05-02 MED ORDER — HYDROCODONE-ACETAMINOPHEN 5-325 MG PO TABS
1.0000 | ORAL_TABLET | ORAL | Status: DC | PRN
  Administered 2018-05-02: 1 via ORAL
  Filled 2018-05-02: qty 1

## 2018-05-02 MED ORDER — GABAPENTIN 300 MG PO CAPS
300.0000 mg | ORAL_CAPSULE | Freq: Two times a day (BID) | ORAL | Status: DC
  Administered 2018-05-02: 300 mg via ORAL
  Filled 2018-05-02: qty 1

## 2018-05-02 MED ORDER — CLONAZEPAM 1 MG PO TABS: 1.0000 mg | ORAL_TABLET | Freq: Every day | ORAL | Status: DC | PRN

## 2018-05-02 MED ORDER — IPRATROPIUM-ALBUTEROL 20-100 MCG/ACT IN AERS
1.0000 | INHALATION_SPRAY | Freq: Four times a day (QID) | RESPIRATORY_TRACT | Status: DC
  Administered 2018-05-02 – 2018-05-03 (×2): 1 via RESPIRATORY_TRACT
  Filled 2018-05-02: qty 4

## 2018-05-02 MED ORDER — BECLOMETHASONE DIPROPIONATE 80 MCG/ACT IN AERS
2.0000 | INHALATION_SPRAY | Freq: Two times a day (BID) | RESPIRATORY_TRACT | Status: DC
  Administered 2018-05-02: 2 via RESPIRATORY_TRACT
  Filled 2018-05-02: qty 8.7

## 2018-05-02 MED ORDER — GUAIFENESIN ER 600 MG PO TB12
600.0000 mg | ORAL_TABLET | Freq: Every day | ORAL | Status: DC | PRN
  Administered 2018-05-03: 600 mg via ORAL
  Filled 2018-05-02 (×2): qty 1

## 2018-05-02 MED ORDER — CLONAZEPAM 0.5 MG PO TABS: 1.0000 mg | ORAL_TABLET | Freq: Two times a day (BID) | ORAL | Status: DC | PRN

## 2018-05-02 MED ORDER — CLONAZEPAM 0.5 MG PO TABS
0.5000 mg | ORAL_TABLET | Freq: Every evening | ORAL | Status: DC | PRN
  Filled 2018-05-02: qty 1

## 2018-05-02 MED ORDER — SODIUM CHLORIDE 0.9 % IV SOLN
500.0000 mg | INTRAVENOUS | Status: DC
  Administered 2018-05-03: 500 mg via INTRAVENOUS
  Filled 2018-05-02 (×2): qty 500

## 2018-05-02 MED ORDER — TRAZODONE HCL 50 MG PO TABS
100.0000 mg | ORAL_TABLET | Freq: Every day | ORAL | Status: DC
  Administered 2018-05-02: 100 mg via ORAL
  Filled 2018-05-02: qty 2

## 2018-05-02 MED ORDER — NALOXONE HCL 0.4 MG/ML IJ SOLN
INTRAMUSCULAR | Status: AC
  Filled 2018-05-02: qty 1

## 2018-05-02 MED ORDER — POTASSIUM CHLORIDE CRYS ER 20 MEQ PO TBCR
40.0000 meq | EXTENDED_RELEASE_TABLET | Freq: Once | ORAL | Status: AC
  Administered 2018-05-02: 40 meq via ORAL
  Filled 2018-05-02: qty 2

## 2018-05-02 NOTE — ED Provider Notes (Signed)
[facility] EMERGENCY DEPARTMENT Provider Note   CSN: [id number] Arrival date & time: 05/02/18  1747    History   Chief Complaint Chief Complaint  Patient presents with  . Shortness of Breath    HPI [name] is a 38 y.o. female.     Patient with history of COPD, morbid obesity left AGAINST MEDICAL ADVICE this morning.  Patient was admitted for COPD exacerbation, new hypoxia, concern for coronavirus.  Patient states that she had to leave to help take care of family matters.  She states that her shortness of breath continues to get worse.  She is very short of breath with exertion but when she is at rest and talking she feels improved.  She has not taken any Tylenol or albuterol since she has left the hospital.  She states that she is willing to stay for further symptomatic care at this time.  The history is provided by the patient.  Fever  Temp source:  Subjective Severity:  Moderate Onset quality:  Gradual Duration:  3 days Timing:  Intermittent Progression:  Waxing and waning Chronicity:  New Relieved by:  Acetaminophen Worsened by:  Nothing Associated symptoms: chills, congestion, cough and myalgias   Associated symptoms: no chest pain, no dysuria, no ear pain, no rash, no sore throat and no vomiting   Risk factors: recent sickness and sick contacts     Past Medical History:  Diagnosis Date  . Anxiety   . Arthritis   . Asthma   . Bipolar 1 disorder (HCC)   . COPD (chronic obstructive pulmonary disease) (HCC)   . Depression   . Emphysema   . Obesity   . Pneumonia   . Rib fracture     Patient Active Problem List   Diagnosis Date Noted  . OSA (obstructive sleep apnea) 05/01/2018  . Hypokalemia 05/01/2018  . Lactic acidosis 05/01/2018  . Acute on chronic respiratory failure with hypoxemia (HCC) 05/01/2018  . Sepsis (HCC) 05/01/2018  . COPD (chronic obstructive pulmonary disease) (HCC) 07/04/2016  . Acute respiratory failure with  hypoxia (HCC) 07/04/2016    Past Surgical History:  Procedure Laterality Date  . CHOLECYSTECTOMY    . TUBAL LIGATION       OB History    Gravida  6   Para  3   Term  3   Preterm  0   AB  3   Living  3     SAB  3   TAB  0   Ectopic  0   Multiple  0   Live Births               Home Medications    Prior to Admission medications   Medication Sig Start Date End Date Taking? Authorizing Provider  albuterol (PROVENTIL HFA;VENTOLIN HFA) 108 (90 Base) MCG/ACT inhaler Inhale 2 puffs into the lungs every 6 (six) hours as needed. For shortness of breath. 07/05/16   Kathlen Mody, MD  beclomethasone (QVAR) 80 MCG/ACT inhaler Inhale 2 puffs into the lungs 2 (two) times daily.    [provider]  clonazePAM (KLONOPIN) 0.5 MG tablet Take 2 tablets (1 mg total) by mouth 2 (two) times daily as needed for anxiety (take 1mg  every am and .5mg  every pm ). 01/29/11 05/01/18  Jethro Bastos, NP  cyclobenzaprine (FLEXERIL) 10 MG tablet Take 1 tablet (10 mg total) by mouth 2 (two) times daily as needed for muscle spasms. 01/20/18   Robinson, [place] N, PA-C  escitalopram (LEXAPRO) 20 MG tablet Take 20 mg by mouth daily.      [provider]  gabapentin (NEURONTIN) 300 MG capsule Take 300 mg by mouth 2 (two) times daily.     [provider]  guaiFENesin (MUCINEX) 600 MG 12 hr tablet Take 600 mg by mouth daily as needed for cough or to loosen phlegm.    [provider]  HYDROcodone-homatropine (HYCODAN) 5-1.5 MG/5ML syrup Take 5 mLs by mouth every 6 (six) hours as needed for cough. Patient not taking: Reported on 01/22/2015 09/01/14   Mady Gemma, PA-C  ipratropium-albuterol (DUONEB) 0.5-2.5 (3) MG/3ML SOLN Take 3 mLs by nebulization every 4 (four) hours as needed. Patient taking differently: Take 3 mLs by nebulization every 4 (four) hours as needed (shortness of breath).  07/05/16   Kathlen Mody, MD  lurasidone (LATUDA) 80 MG TABS tablet Take 80 mg  by mouth daily with breakfast.    [provider]  omeprazole (PRILOSEC) 40 MG capsule Take 40 mg by mouth daily. 04/04/18   [provider]  traZODone (DESYREL) 50 MG tablet Take 100 mg by mouth daily.  03/30/18   [provider]    Family History No family history on file.  Social History Social History   Tobacco Use  . Smoking status: Former Smoker    Packs/day: 0.00    Last attempt to quit: 05/07/2011    Years since quitting: 6.9  . Smokeless tobacco: Never Used  Substance Use Topics  . Alcohol use: No  . Drug use: No     Allergies   Iohexol   Review of Systems Review of Systems  Constitutional: Positive for chills and fever.  HENT: Positive for congestion. Negative for ear pain and sore throat.   Eyes: Negative for pain and visual disturbance.  Respiratory: Positive for cough, shortness of breath and wheezing.   Cardiovascular: Negative for chest pain and palpitations.  Gastrointestinal: Negative for abdominal pain and vomiting.  Genitourinary: Negative for dysuria and hematuria.  Musculoskeletal: Positive for myalgias. Negative for arthralgias and back pain.  Skin: Negative for color change and rash.  Neurological: Negative for seizures and syncope.  All other systems reviewed and are negative.    Physical Exam Updated Vital Signs  ED Triage Vitals  Enc Vitals Group     BP 05/02/18 1801 101/68     Pulse Rate 05/02/18 1801 (!) 112     Resp 05/02/18 1801 20     Temp 05/02/18 1801 99.2 F (37.3 C)     Temp Source 05/02/18 1801 Oral     SpO2 05/02/18 1801 96 %     Weight 05/02/18 1757 (!) 370 lb (167.8 kg)     Height 05/02/18 1757  (1.676 m)     Head Circumference --      Peak Flow --      Pain Score 05/02/18 1759 9     Pain Loc --      Pain Edu? --      Excl. in GC? --     Physical Exam Vitals signs and nursing note reviewed.  Constitutional:      General: She is not in acute distress.    Appearance: She is  well-developed.  HENT:     Head: Normocephalic and atraumatic.  Eyes:     Extraocular Movements: Extraocular movements intact.     Conjunctiva/sclera: Conjunctivae normal.     Pupils: Pupils are equal, round, and reactive to light.  Neck:  Musculoskeletal: Normal range of motion and neck supple.  Cardiovascular:     Rate and Rhythm: Normal rate and regular rhythm.     Heart sounds: No murmur.  Pulmonary:     Effort: Pulmonary effort is normal. Tachypnea present. No respiratory distress.  Abdominal:     Palpations: Abdomen is soft.     Tenderness: There is no abdominal tenderness.  Musculoskeletal: Normal range of motion.     Right lower leg: No edema.     Left lower leg: No edema.  Skin:    General: Skin is warm and dry.     Capillary Refill: Capillary refill takes less than 2 seconds.  Neurological:     General: No focal deficit present.     Mental Status: She is alert.  Psychiatric:        Mood and Affect: Mood normal.      ED Treatments / Results  Labs (all labs ordered are listed, but only abnormal results are displayed) Labs Reviewed  CBC WITH DIFFERENTIAL/PLATELET  BASIC METABOLIC PANEL  LACTIC ACID, PLASMA  LACTIC ACID, PLASMA    EKG EKG Interpretation  Date/Time:  Wednesday May 02 2018 18:01:36 EDT Ventricular Rate:  109 PR Interval:    QRS Duration: 101 QT Interval:  363 QTC Calculation: 489 R Axis:   -74 Text Interpretation:  Sinus tachycardia Inferior infarct, old Abnormal lateral Q waves Anterior infarct, old Confirmed by Virgina Norfolk [phone]) on 05/02/2018 6:07:29 PM   Radiology Dg Chest 2 View  Result Date: 05/01/2018 CLINICAL DATA:  Chest pain and shortness of breath EXAM: CHEST - 2 VIEW COMPARISON:  July 04, 2016 FINDINGS: No edema or consolidation. Heart is upper normal in size with pulmonary vascularity normal. No adenopathy. No pneumothorax. No bone lesions. IMPRESSION: No edema or consolidation.  Heart upper normal in size.  Electronically Signed   By: Bretta Bang III M.D.   On: 05/01/2018 20:44    Procedures .Critical Care Performed by: Virgina Norfolk, DO Authorized by: Virgina Norfolk, DO   Critical care provider statement:    Critical care time (minutes):  35   Critical care was necessary to treat or prevent imminent or life-threatening deterioration of the following conditions:  Respiratory failure   Critical care was time spent personally by me on the following activities:  Blood draw for specimens, development of treatment plan with patient or surrogate, discussions with primary provider, evaluation of patient's response to treatment, examination of patient, obtaining history from patient or surrogate, ordering and performing treatments and interventions, ordering and review of laboratory studies, pulse oximetry, re-evaluation of patient's condition, review of old charts and ordering and review of radiographic studies   I assumed direction of critical care for this patient from another provider in my specialty: no     (including critical care time)  Medications Ordered in ED Medications  albuterol (PROVENTIL HFA;VENTOLIN HFA) 108 (90 Base) MCG/ACT inhaler 10 puff (10 puffs Inhalation Given 05/02/18 1817)  acetaminophen (TYLENOL) tablet 650 mg (650 mg Oral Given 05/02/18 1816)     Initial Impression / Assessment and Plan / ED Course  I have reviewed the triage vital signs and the nursing notes.  Pertinent labs & imaging results that were available during my care of the patient were reviewed by me and considered in my medical decision making (see chart for details).     [name] is a 38 year old female with history of COPD, bipolar, OSA who presents to the ED with fever, shortness of  breath.  Patient left AGAINST MEDICAL ADVICE this morning.  She is being treated for COPD exacerbation likely secondary to viral process.  High suspicion for coronavirus due to multiple possible exposures.   Multiple patients with whom she lives with also under investigation for coronavirus.  Patient febrile, hypoxic, tachycardic last night which continues today.  Patient with increased work of breathing.  Will give albuterol inhaler.  Given Tylenol.  CBC, BMP, lactic acid, repeat chest x-ray ordered.  My physician assistant evaluated this patient last night.  She already had coronavirus testing sent for.  Procalcitonin was normal.  She had elevated lactic acid yesterday.  She was given IV fluid bolus.  At this time given ongoing increased work of breathing, hypoxia will admit for further care.  Lab work shows Potassium of 2.6.  Will replete.  Patient with normal lactic acid.  Continues with elevated white count.  Patient admitted to medicine service for further care.  Suspect this is likely secondary to coronavirus.  This chart was dictated using voice recognition software.  Despite best efforts to proofread,  errors can occur which can change the documentation meaning.   [name] was evaluated in Emergency Department on 05/02/2018 for the symptoms described in the history of present illness. She was evaluated in the context of the global COVID-19 pandemic, which necessitated consideration that the patient might be at risk for infection with the SARS-CoV-2 virus that causes COVID-19. Institutional protocols and algorithms that pertain to the evaluation of patients at risk for COVID-19 are in a state of rapid change based on information released by regulatory bodies including the CDC and federal and state organizations. These policies and algorithms were followed during the patient's care in the ED.     Final Clinical Impressions(s) / ED Diagnoses   Final diagnoses:  Acute respiratory failure with hypoxia (HCC)  Sepsis, due to unspecified organism, unspecified whether acute organ dysfunction present [facility](HCC)  Upper respiratory tract infection, unspecified type    ED Discharge Orders    None        Virgina NorfolkCuratolo, Sanaiyah Kirchhoff, DO 05/02/18 1913

## 2018-05-02 NOTE — ED Notes (Signed)
Critical K (2.6), EDP aware.

## 2018-05-02 NOTE — ED Notes (Signed)
ED TO INPATIENT HANDOFF REPORT  ED Nurse Name and Phone #: [phone]  S Name/Age/Gender [name] [age] y.o. female Room/Bed: 017C/017C  Code Status   Code Status: Full Code  Home/SNF/Other Home Patient oriented to: self, place, time and situation Is this baseline? Yes   Triage Complete: Triage complete  Chief Complaint SOB, sore throat, vomitting  Triage Note Patient reports worsening SOB with chest congestion and productive cough this week , history of COPD , pt. added central chest tightness/ pain . Patient stated fever today took Tylenol prior to arrival . Denies travel history or sick contact .    Allergies Allergies  Allergen Reactions  . Iohexol Cough    Level of Care/Admitting Diagnosis ED Disposition    ED Disposition Condition Comment   Admit  [facility]ospital [facility]: [facility] [[id number]]  Level of Care: Telemetry Medical [104]  Diagnosis: Sepsis [facility](HCC) [[id number]]) [[id number]]  Admitting Physician: Rometta EmeryGARBA, MOHAMMAD L [[id number]]  Attending Physician: Rometta EmeryGARBA, MOHAMMAD L [[id number]]  Estimated length of stay: past midnight tomorrow  Certification:: I certify this patient will need inpatient services for at least 2 midnights  Bed request comments: high risk covid  PT Class (Do Not Modify): Inpatient [101]  PT Acc Code (Do Not Modify): Private [1]       B Medical/Surgery History Past Medical History:  Diagnosis Date  . Anxiety   . Arthritis   . Asthma   . Bipolar 1 disorder (HCC)   . COPD (chronic obstructive pulmonary disease) (HCC)   . Depression   . Emphysema   . Obesity   . Pneumonia   . Rib fracture    Past Surgical History:  Procedure Laterality Date  . CHOLECYSTECTOMY    . TUBAL LIGATION       A IV Location/Drains/Wounds Patient Lines/Drains/Airways Status   Active Line/Drains/Airways    Name:   Placement date:   Placement time:   Site:   Days:   Peripheral IV 07/04/16 Right Forearm   07/04/16    1140    Forearm   667   Peripheral IV 05/01/18  Left Antecubital   05/01/18    2120    Antecubital   1   Peripheral IV 05/02/18 Posterior;Right Forearm   05/02/18    0045    Forearm   less than 1          Intake/Output Last 24 hours No intake or output data in the 24 hours ending 05/02/18 0129  Labs/Imaging Results for orders placed or performed during the hospital encounter of 05/01/18 (from the past 48 hour(s))  CBC     Status: Abnormal   Collection Time: 05/01/18  8:03 PM  Result Value Ref Range   WBC 14.2 (H) 4.0 - 10.5 K/uL   RBC 4.68 3.87 - 5.11 MIL/uL   Hemoglobin 12.9 12.0 - 15.0 g/dL   HCT 95.640.8 38.736.0 - 56.446.0 %   MCV 87.2 80.0 - 100.0 fL   MCH 27.6 26.0 - 34.0 pg   MCHC 31.6 30.0 - 36.0 g/dL   RDW 33.215.0 95.111.5 - 88.415.5 %   Platelets 366 150 - 400 K/uL   nRBC 0.0 0.0 - 0.2 %    Comment: Performed at [facility], [address] [place], [place] [address]  Troponin I - Once     Status: None   Collection Time: 05/01/18  8:03 PM  Result Value Ref Range   Troponin I <0.03 <0.03 ng/mL    Comment: Performed at [facility], [address] [place], [place] [address]  Comprehensive metabolic panel     Status: Abnormal   Collection Time: 05/01/18  8:43 PM  Result Value Ref Range   Sodium 137 135 - 145 mmol/L   Potassium 2.4 (LL) 3.5 - 5.1 mmol/L    Comment: CRITICAL RESULT CALLED TO, READ BACK BY AND VERIFIED WITH: FUNK,B RN 05/01/2154 JORDANS    Chloride 96 (L) 98 - 111 mmol/L   CO2 30 22 - 32 mmol/L   Glucose, Bld 205 (H) 70 - 99 mg/dL   BUN 5 (L) 6 - 20 mg/dL   Creatinine, Ser 7.41 0.44 - 1.00 mg/dL   Calcium 8.6 (L) 8.9 - 10.3 mg/dL   Total Protein 6.5 6.5 - 8.1 g/dL   Albumin 3.2 (L) 3.5 - 5.0 g/dL   AST 17 15 - 41 U/L   ALT 19 0 - 44 U/L   Alkaline Phosphatase 78 38 - 126 U/L   Total Bilirubin 0.4 0.3 - 1.2 mg/dL   GFR calc non Af Amer >60 >60 mL/min   GFR calc Af Amer >60 >60 mL/min   Anion gap 11 5 - 15    Comment: Performed at [facility], [address] [place], [place] [address]  Brain  natriuretic peptide     Status: None   Collection Time: 05/01/18  8:44 PM  Result Value Ref Range   B Natriuretic Peptide 40.9 0.0 - 100.0 pg/mL    Comment: Performed at [facility], [address], [place], [place] [address]  Influenza panel by PCR (type A & B)     Status: None   Collection Time: 05/01/18  8:52 PM  Result Value Ref Range   Influenza A By PCR NEGATIVE NEGATIVE   Influenza B By PCR NEGATIVE NEGATIVE    Comment: (NOTE) The Xpert Xpress Flu assay is intended as an aid in the diagnosis of  influenza and should not be used as a sole basis for treatment.  This  assay is FDA approved for nasopharyngeal swab specimens only. Nasal  washings and aspirates are unacceptable for Xpert Xpress Flu testing. Performed at [facility], [address], [place], [place] [address]   Lactic acid, plasma     Status: Abnormal   Collection Time: 05/01/18  9:04 PM  Result Value Ref Range   Lactic Acid, Venous 2.0 (HH) 0.5 - 1.9 mmol/L    Comment: CRITICAL RESULT CALLED TO, READ BACK BY AND VERIFIED WITH: FUNK,B RN 05/01/2018 2156 JORDANS Performed at [facility], [address] [place], [place] [address]   D-dimer, quantitative (not at [facility])     Status: None   Collection Time: 05/01/18 10:29 PM  Result Value Ref Range   D-Dimer, Quant 0.49 0.00 - 0.50 ug/mL-FEU    Comment: (NOTE) At the manufacturer cut-off of 0.50 ug/mL FEU, this assay has been documented to exclude PE with a sensitivity and negative predictive value of 97 to 99%.  At this time, this assay has not been approved by the FDA to exclude DVT/VTE. Results should be correlated with clinical presentation. Performed at [facility], [address] [place], [place] [address]   I-Stat beta hCG blood, ED     Status: None   Collection Time: 05/01/18 10:56 PM  Result Value Ref Range   I-stat hCG, quantitative <5.0 <5 mIU/mL   Comment 3  Comment:   GEST. AGE      CONC.  (mIU/mL)   <=1 WEEK         5 - 50     2 WEEKS       50 - 500     3 WEEKS       100 - 10,000     4 WEEKS     1,000 - 30,000        FEMALE AND NON-PREGNANT FEMALE:     LESS THAN 5 mIU/mL   Lactic acid, plasma     Status: None   Collection Time: 05/01/18 11:02 PM  Result Value Ref Range   Lactic Acid, Venous 1.6 0.5 - 1.9 mmol/L    Comment: Performed at [facility], [address] [place], [place] [address]  CBC     Status: Abnormal   Collection Time: 05/01/18 11:02 PM  Result Value Ref Range   WBC 13.3 (H) 4.0 - 10.5 K/uL   RBC 4.41 3.87 - 5.11 MIL/uL   Hemoglobin 12.1 12.0 - 15.0 g/dL   HCT 47.8 29.5 - 62.1 %   MCV 87.5 80.0 - 100.0 fL   MCH 27.4 26.0 - 34.0 pg   MCHC 31.3 30.0 - 36.0 g/dL   RDW 30.8 65.7 - 84.6 %   Platelets 329 150 - 400 K/uL   nRBC 0.0 0.0 - 0.2 %    Comment: Performed at [facility], [address], [place], [place] [address]  Creatinine, serum     Status: None   Collection Time: 05/01/18 11:02 PM  Result Value Ref Range   Creatinine, Ser 0.66 0.44 - 1.00 mg/dL   GFR calc non Af Amer >60 >60 mL/min   GFR calc Af Amer >60 >60 mL/min    Comment: Performed at [facility], [address], [place], [place] [address]  Ferritin     Status: None   Collection Time: 05/01/18 11:02 PM  Result Value Ref Range   Ferritin 27 11 - 307 ng/mL    Comment: Performed at [facility], [address], [place], [place] [address]  Sedimentation rate     Status: Abnormal   Collection Time: 05/01/18 11:02 PM  Result Value Ref Range   Sed Rate 23 (H) 0 - 22 mm/hr    Comment: Performed at [facility], [address] [place], [place] [address]  Lactate dehydrogenase     Status: Abnormal   Collection Time: 05/01/18 11:21 PM  Result Value Ref Range   LDH 228 (H) 98 - 192 U/L    Comment: Performed at [facility], [address] [place], [place] [address]   Dg Chest 2 View  Result Date: 05/01/2018 CLINICAL DATA:  Chest pain and shortness of breath EXAM:  CHEST - 2 VIEW COMPARISON:  July 04, 2016 FINDINGS: No edema or consolidation. Heart is upper normal in size with pulmonary vascularity normal. No adenopathy. No pneumothorax. No bone lesions. IMPRESSION: No edema or consolidation.  Heart upper normal in size. Electronically Signed   By: Bretta Bang III M.D.   On: 05/01/2018 20:44    Pending Labs Unresulted Labs (From admission, onward)    Start     Ordered   05/08/18 0500  Creatinine, serum  (enoxaparin (LOVENOX)    CrCl >/= 30 ml/min)  Weekly,   R    Comments:  while on enoxaparin therapy    05/01/18 2314   05/02/18  0500  Comprehensive metabolic panel  Tomorrow morning,   R     05/01/18 2314   05/02/18 0500  CBC with Differential/Platelet  Tomorrow morning,   R     05/01/18 2314   05/01/18 2235  Novel Coronavirus, NAA (hospital order; send-out to ref lab)  ([facility] IP COVID ADMIT NOVEL CORONAVIRUS, NAA (HOSPITAL ORDER; SEND-OUT TO REF LAB))  Once,   R    Question Answer Comment  Current symptoms Fever and Shortness of breath   Excluded other viral illnesses Yes   Exposure Risk None      05/01/18 2234   05/01/18 2235  Respiratory Panel by PCR  Add-on,   R     05/01/18 2234   05/01/18 2235  Procalcitonin  Once,   R     05/01/18 2234   05/01/18 2043  Blood Culture (routine x 2)  BLOOD CULTURE X 2,   STAT     05/01/18 2045   05/01/18 2043  Urinalysis, Routine w reflex microscopic  ONCE - STAT,   STAT     05/01/18 2045          Vitals/Pain Today's Vitals   05/01/18 2245 05/01/18 2300 05/02/18 0054 05/02/18 0100  BP: 93/67  (!) 113/58   Pulse: (!) 103 (!) 105 (!) 107 (!) 103  Resp: (!) 21 (!) 22 (!) 23 (!) 21  Temp:      TempSrc:      SpO2: 96% 96% 97% 92%  Weight:      Height:      PainSc:        Isolation Precautions Droplet and Contact precautions  Medications Medications  potassium chloride 10 mEq in 100 mL IVPB (10 mEq Intravenous New Bag/Given 05/02/18 0052)  gabapentin (NEURONTIN) capsule 300 mg (300 mg Oral  Not Given 05/02/18 0031)  escitalopram (LEXAPRO) tablet 20 mg (has no administration in time range)  clonazePAM (KLONOPIN) tablet 1 mg (0.5 mg Oral Given 05/02/18 0030)  beclomethasone (QVAR) 80 MCG/ACT inhaler 2 puff (has no administration in time range)  lurasidone (LATUDA) tablet 80 mg (has no administration in time range)  albuterol (PROVENTIL HFA;VENTOLIN HFA) 108 (90 Base) MCG/ACT inhaler 2 puff (has no administration in time range)  cyclobenzaprine (FLEXERIL) tablet 10 mg (has no administration in time range)  pantoprazole (PROTONIX) EC tablet 40 mg (has no administration in time range)  traZODone (DESYREL) tablet 100 mg (has no administration in time range)  guaiFENesin (MUCINEX) 12 hr tablet 600 mg (has no administration in time range)  enoxaparin (LOVENOX) injection 40 mg (has no administration in time range)  dextrose 5 %-0.9 % sodium chloride infusion (has no administration in time range)  azithromycin (ZITHROMAX) 500 mg in sodium chloride 0.9 % 250 mL IVPB (500 mg Intravenous New Bag/Given 05/02/18 0038)  cefTRIAXone (ROCEPHIN) 1 g in sodium chloride 0.9 % 100 mL IVPB (has no administration in time range)  HYDROcodone-acetaminophen (NORCO/VICODIN) 5-325 MG per tablet 1 tablet (has no administration in time range)  sodium chloride 0.9 % bolus 1,000 mL (1,000 mLs Intravenous New Bag/Given 05/01/18 2121)  ceFEPIme (MAXIPIME) 2 g in sodium chloride 0.9 % 100 mL IVPB (0 g Intravenous Stopped 05/01/18 2246)  vancomycin (VANCOCIN) 2,000 mg in sodium chloride 0.9 % 500 mL IVPB (2,000 mg Intravenous New Bag/Given 05/01/18 2328)  albuterol (PROVENTIL HFA;VENTOLIN HFA) 108 (90 Base) MCG/ACT inhaler 8 puff (8 puffs Inhalation Given 05/01/18 2216)    Mobility walks Low fall risk   Focused Assessments Pulmonary Assessment Handoff:  Lung sounds:   O2 Device: Nasal Cannula O2 Flow Rate (L/min): 5 L/min      R Recommendations: See Admitting Provider Note  Report given to:   Additional Notes:  N/A

## 2018-05-02 NOTE — ED Triage Notes (Signed)
Pt here for worsening sob. Pt was admitted yesterday and left AMA this morning to help her daugher. Pt with labored breathing at rest, worsened with exertion.

## 2018-05-02 NOTE — Progress Notes (Addendum)
RN paged because pt was "barely responsive" and working hard to breath. RRRN called and NP to bedside. Upon arrival, pt was awake, answering questions and eating jello. O2 sat good.  She is likely a sleep apnea pt, but not on CPAP at home. d'c'd all her sedative medications. Put CO2 monitor on her for monitoring while asleep.  Continue to follow.  KJKG, NP [facility] Update: Called back to room by RRRN due to pt being more lethargic now and still desatting. NP into room with bouffant, N95, gown, gloves and face shield. S: pt can not participate in ROS due to being basically unresponsive. O: Morbidly obese WF who is non verbal even with sternal rub. Only opens her eyes for seconds and goes back to sleep. Gasping breaths at times. RRR.  A/P: 1. Hypoxia with increased O2 demand and basically unresponsive in this r/o COVID pt. Probably Trazodone related and her sedative meds have been dicontinued. Is now on 12 L O2 per [place]. ABG with pH 7.3, PO2 81, PCO2 50s, Bicarb 30s. Called Elink to have PCCM see pt. NP believes pt needs to be transferred to ICU and be intubated. Obtaining bed now.  2. Morbid obesity-contributing to above.    3. COPD-no wheezing. On Azithromycin and Cefepime. MDIs.   4. Presenting with SOB, cough, fevers-r/o COVID. On airborne and contact isolation. Test pending. Holding Plaquenil due to prolonged qtc. Respiratory panel + for parainfluenza virus 4. Flu neg. 5. Depression/bipolar-had Trazodone tonight. Likely contributing to #1. Stopped sedative meds.   As typing, PCCM on floor seeing pt.  [facility], NP [facility] Performed by: Leda Gauze Total critical care time: Start 0150 End 0240. Total of 40 mins.  Critical care time was exclusive of separately billable procedures and treating other patients. Critical care was necessary to treat or prevent imminent or life-threatening deterioration. Critical care was time spent personally by me on the following activities:  development of treatment plan with patient and/or surrogate as well as nursing, discussions with consultants, evaluation of patient's response to treatment, examination of patient, obtaining history from patient or surrogate, ordering and performing treatments and interventions, ordering and review of laboratory studies, ordering and review of radiographic studies, pulse oximetry and re-evaluation of patient's condition.

## 2018-05-02 NOTE — Discharge Summary (Signed)
AMA NOTE    Patient ID: [name] MRN: [id number] DOB/AGE: 38/03/1980 38 y.o.  Admit date: 05/01/2018 Discharge date: 05/02/2018   PLEASE NOTE THAT PATIENT LEFT AGAINST MEDICAL ADVICE. Risks of COVID-19 pending results, worsening respiratory failure, death were explained in detail to the patient and she verbally understood the risks of leaving AMA.   Primary Care Physician:  [name], Marissa L, MD  Discharge Diagnoses:   . Acute on chronic respiratory failure with hypoxemia (HCC) . COPD (chronic obstructive pulmonary disease) (HCC) . Acute respiratory failure with hypoxia (HCC) . OSA (obstructive sleep apnea) . Hypokalemia . Lactic acidosis    Consults: None   Recommendations for Outpatient Follow-up: Follow COVID-19 results    TESTS THAT NEED FOLLOW-UP COVID-19   DIET: Heart healthy diet    Allergies:   Allergies  Allergen Reactions  . Iohexol Cough     Discharge Medications: Please note that patient left AMA (against medical advice)   Brief H and P: For complete details please refer to admission H and P, but in brief *[name] is a 38 y.o. female with medical history significant of COPD, bipolar disorder, hypertension, obstructive sleep apnea not yet on  CPAP depression with anxiety, chronic pain syndrome who came in from home with fever up to 102 and shortness of breath.  Patient has been having respiratory symptoms for about 2 weeks.  She was coughing brown sputum with some bloody streaks within the last week.  She also had 1 of her sons who is equally having respiratory symptoms coughing and having fever in the ER.  1 of patient's siblings is currently quarantined with suspected COVID-19 already on treatment with Plaquenil.Denied any chest pain.  Denied any nausea vomiting or diarrhea.  She has inhalers and nebulizers at home which she has been using.  She is also getting various cough medication that she has been trying.  Patient is being  admitted with acute respiratory failure due to new oxygen requirement of 2 L at the moment.  Chest x-ray showed 5 has been negative.  Suspected COVID-19 and is being admitted with droplet isolation. ED Course: Temperature is 99.9 blood pressure 98/54 pulse 112 respiratory rate of 21 oxygen sats 93% room air.  White count is 14.2 potassium 2.4 chloride 96 lactic acid 2.1 calcium of 8.6 glucose 205. BNP of 40.9.  Blood cultures have been obtained and patient is being admitted for treatment of acute respiratory failure possibly COVID-19[facility]**  Hospital Course:     Acute respiratory failure with hypoxia (HCC) -Patient presented with new oxygen requirement, fever, tachypnea, possibly early sepsis, covid-19 was ordered, pending results -Patient was placed on empiric antibiotics -Chest x-ray was negative for infiltrates at the time of admission, repeat chest x-ray on 4/8 was pending Before my encounter, patient left AGAINST MEDICAL ADVICE.  Patient was angry about the isolation precautions.  She refused to sign the AMA form.  Patient was advised to keep her mask on and quarantine herself at home.  Patient was strongly recommended to return back to ER if worsening fevers > 100 F, shortness of breath, nausea vomiting diarrhea    COPD (chronic obstructive pulmonary disease) (HCC) -Mild exacerbation, patient was placed on inhalers  Hypokalemia -Potassium still low this morning, patient left AMA prior to my encounter  Lactic acidosis Possibly due to early sepsis, presumed COVID     Day of Discharge BP 128/65 (BP Location: Right Arm)   Pulse 92   Temp 98.4 F (36.9 C) (Oral)  Resp (!) 24   Ht 5\' 6"  (1.676 m)   Wt (!) 167.8 kg   LMP 04/17/2018 (Approximate) Comment: tubal ligation  SpO2 (!) 88%   BMI 59.72 kg/m   Physical Exam: General: Alert and awake oriented x3 not in any acute distress.  Walked out of the hallway in front of me Was not able to examine the patient as she left AMA prior to  my encounter.   The results of significant diagnostics from this hospitalization (including imaging, microbiology, ancillary and laboratory) are listed below for reference.    LAB RESULTS: Basic Metabolic Panel: Recent Labs  Lab 05/01/18 2043 05/01/18 2302 05/02/18 0439  NA 137  --  139  K 2.4*  --  2.4*  CL 96*  --  101  CO2 30  --  28  GLUCOSE 205*  --  203*  BUN 5*  --  5*  CREATININE 0.68 0.66 0.64  CALCIUM 8.6*  --  8.2*   Liver Function Tests: Recent Labs  Lab 05/01/18 2043 05/02/18 0439  AST 17 14*  ALT 19 17  ALKPHOS 78 76  BILITOT 0.4 0.3  PROT 6.5 5.8*  ALBUMIN 3.2* 2.9*   No results for input(s): LIPASE, AMYLASE in the last 168 hours. No results for input(s): AMMONIA in the last 168 hours. CBC: Recent Labs  Lab 05/01/18 2302 05/02/18 0439  WBC 13.3* 12.0*  NEUTROABS  --  8.8*  HGB 12.1 12.0  HCT 38.6 38.1  MCV 87.5 87.2  PLT 329 328   Cardiac Enzymes: Recent Labs  Lab 05/01/18 2003  TROPONINI <0.03   BNP: Invalid input(s): POCBNP CBG: No results for input(s): GLUCAP in the last 168 hours.  Significant Diagnostic Studies:  Dg Chest 2 View  Result Date: 05/01/2018 CLINICAL DATA:  Chest pain and shortness of breath EXAM: CHEST - 2 VIEW COMPARISON:  July 04, 2016 FINDINGS: No edema or consolidation. Heart is upper normal in size with pulmonary vascularity normal. No adenopathy. No pneumothorax. No bone lesions. IMPRESSION: No edema or consolidation.  Heart upper normal in size. Electronically Signed   By: Bretta Bang III M.D.   On: 05/01/2018 20:44    2D ECHO:   Disposition and Follow-up:    DISPOSITION: Patient left AMA. She was advised to seek follow-up with primary care physician.     DISCHARGE FOLLOW-UP    Time spent on Discharge: 25-minutes  Signed:   Thad Ranger M.D. [facility] 05/02/2018, 8:28 AM Pager: [phone]

## 2018-05-02 NOTE — ED Notes (Signed)
Pt called out and stated that she needed to leave due to having no one to care for her daughter. Pt removed cuff and pulse ox. PA and MD informed. PA spoke with patient and pt ended up deciding to stay and get treatment.

## 2018-05-02 NOTE — H&P (Signed)
History and Physical    [name] WUJ:[id number] DOB: [birth year] DOA: 05/02/2018  PCP: Rometta Emery, MD   Patient coming from: Home  Chief Complaint: Shortness of breath, cough, fever  HPI: [name] is a 38 y.o. female with medical history significant of bipolar disorder, COPD on room air, hypertension, sleep apnea but not yet on CPAP, anxiety, depression who presents back to the ED this evening after leaving AMA from our facility last night.  Presented yesterday with similar episodes of worsening shortness of breath cough fever, notably her daughter as well as sister are both sick (coughing,self reported fevers) and her sister, with whom she lives, is currently under investigation for COVID-19.  Patient has been using her inhalers and nebulizers at home with very little to no improvement in her symptoms.  Patient admits as above to shortness of breath, cough, fever as high as 102 recorded at home as well as poor taste and poor smell -these complaints have been worsening over the past 48 hours otherwise declines nausea, vomiting, diarrhea, constipation, headache.  Remains hypoxic on room air requiring 2 L nasal cannula, hospitalist was contacted for admission.  ED Course: The ED patient was tachypneic, tachycardic, afebrile and hypoxic to as low as 81% on room air; chest x-ray shows questionably right lower lobe opacification -personally reviewed.  Labs otherwise workable for hypokalemia at 0.6, mild hyperglycemia, elevated LDH at 228, negative procalcitonin, previously elevated lactic acid now resolving, leukocytosis at 14.  Review of Systems: As per HPI otherwise 10 point review of systems negative.   Past Medical History:  Diagnosis Date  . Anxiety   . Arthritis   . Asthma   . Bipolar 1 disorder (HCC)   . COPD (chronic obstructive pulmonary disease) (HCC)   . Depression   . Emphysema   . Obesity   . Pneumonia   . Rib fracture     Past Surgical History:   Procedure Laterality Date  . CHOLECYSTECTOMY    . TUBAL LIGATION       reports that she quit smoking about 6 years ago. She smoked 0.00 packs per day. She has never used smokeless tobacco. She reports that she does not drink alcohol or use drugs.  Allergies  Allergen Reactions  . Iohexol Cough    No family history on file. Family history reviewed, not pertinent/contributory  Prior to Admission medications   Medication Sig Start Date End Date Taking? Authorizing Provider  albuterol (PROVENTIL HFA;VENTOLIN HFA) 108 (90 Base) MCG/ACT inhaler Inhale 2 puffs into the lungs every 6 (six) hours as needed. For shortness of breath. 07/05/16   Kathlen Mody, MD  beclomethasone (QVAR) 80 MCG/ACT inhaler Inhale 2 puffs into the lungs 2 (two) times daily.    [provider]  clonazePAM (KLONOPIN) 0.5 MG tablet Take 2 tablets (1 mg total) by mouth 2 (two) times daily as needed for anxiety (take  every am and .  every pm ). 01/29/11 05/01/18  Jethro Bastos, NP  cyclobenzaprine (FLEXERIL) 10 MG tablet Take 1 tablet (10 mg total) by mouth 2 (two) times daily as needed for muscle spasms. 01/20/18   Robinson, [place] N, PA-C  escitalopram (LEXAPRO) 20 MG tablet Take 20 mg by mouth daily.      [provider]  gabapentin (NEURONTIN) 300 MG capsule Take 300 mg by mouth 2 (two) times daily.     [provider]  guaiFENesin (MUCINEX) 600 MG 12 hr tablet Take 600 mg by mouth daily as  needed for cough or to loosen phlegm.    [provider]  HYDROcodone-homatropine (HYCODAN) 5-1.5 MG/5ML syrup Take 5 mLs by mouth every 6 (six) hours as needed for cough. Patient not taking: Reported on 01/22/2015 09/01/14   Mady Gemma, PA-C  ipratropium-albuterol (DUONEB) 0.5-2.5 (3) MG/3ML SOLN Take 3 mLs by nebulization every 4 (four) hours as needed. Patient taking differently: Take 3 mLs by nebulization every 4 (four) hours as needed (shortness of breath).  07/05/16   Kathlen Mody, MD  lurasidone (LATUDA) 80 MG TABS tablet Take 80 mg by mouth daily with breakfast.    [provider]  omeprazole (PRILOSEC) 40 MG capsule Take 40 mg by mouth daily. 04/04/18   [provider]  traZODone (DESYREL) 50 MG tablet Take 100 mg by mouth daily.  03/30/18   [provider]    Physical Exam: Vitals:   05/02/18 1801 05/02/18 1815 05/02/18 1830 05/02/18 1845  BP: 101/68 112/76 112/66 111/68  Pulse: (!) 112 (!) 103 (!) 104 (!) 105  Resp: 20 19 (!) 27   Temp: 99.2 F (37.3 C)     TempSrc: Oral     SpO2: 96% 98% 94% 96%  Weight:      Height:        Constitutional: NAD, calm, comfortable Vitals:   05/02/18 1801 05/02/18 1815 05/02/18 1830 05/02/18 1845  BP: 101/68 112/76 112/66 111/68  Pulse: (!) 112 (!) 103 (!) 104 (!) 105  Resp: 20 19 (!) 27   Temp: 99.2 F (37.3 C)     TempSrc: Oral     SpO2: 96% 98% 94% 96%  Weight:      Height:       General: Sitting up in bed, tachypneic, mild distress. HEENT:  Normocephalic atraumatic.  Sclerae nonicteric, noninjected.  Extraocular movements intact bilaterally. Neck:  Without mass or deformity.  Trachea is midline. Lungs: Without overt rhonchi, wheeze, or rales. Heart:  Regular rate and rhythm.  Without murmurs, rubs, or gallops. Abdomen:  Soft, obese, nontender, nondistended.  Without guarding or rebound. Extremities: Without cyanosis, clubbing, edema, or obvious deformity. Vascular:  Dorsalis pedis and posterior tibial pulses palpable bilaterally. Skin:  Warm and dry, no erythema, no ulcerations.   Labs on Admission: I have personally reviewed following labs and imaging studies  CBC: Recent Labs  Lab 05/01/18 2003 05/01/18 2302 05/02/18 0439 05/02/18 1801  WBC 14.2* 13.3* 12.0* 14.2*  NEUTROABS  --   --  8.8* 9.8*  HGB 12.9 12.1 12.0 12.4  HCT 40.8 38.6 38.1 38.5  MCV 87.2 87.5 87.2 88.3  PLT 366 329 328 328   Basic Metabolic Panel: Recent Labs  Lab 05/01/18 2043 05/01/18 2302  05/02/18 0439 05/02/18 1801  NA 137  --  139 139  K 2.4*  --  2.4* 2.6*  CL 96*  --  101 100  CO2 30  --  28 27  GLUCOSE 205*  --  203* 143*  BUN 5*  --  5* <5*  CREATININE 0.68 0.66 0.64 0.64  CALCIUM 8.6*  --  8.2* 8.5*   GFR: Estimated Creatinine Clearance: 154.6 mL/min (by C-G formula based on SCr of 0.64 mg/dL). Liver Function Tests: Recent Labs  Lab 05/01/18 2043 05/02/18 0439  AST 17 14*  ALT 19 17  ALKPHOS 78 76  BILITOT 0.4 0.3  PROT 6.5 5.8*  ALBUMIN 3.2* 2.9*   No results for input(s): LIPASE, AMYLASE in the last 168 hours. No results for input(s): AMMONIA  in the last 168 hours. Coagulation Profile: No results for input(s): INR, PROTIME in the last 168 hours. Cardiac Enzymes: Recent Labs  Lab 05/01/18 2003  TROPONINI <0.03   BNP (last 3 results) No results for input(s): PROBNP in the last 8760 hours. HbA1C: No results for input(s): HGBA1C in the last 72 hours. CBG: No results for input(s): GLUCAP in the last 168 hours. Lipid Profile: No results for input(s): CHOL, HDL, LDLCALC, TRIG, CHOLHDL, LDLDIRECT in the last 72 hours. Thyroid Function Tests: No results for input(s): TSH, T4TOTAL, FREET4, T3FREE, THYROIDAB in the last 72 hours. Anemia Panel: Recent Labs    05/01/18 2302  FERRITIN 27   Urine analysis:    Component Value Date/Time   COLORURINE RED (A) 07/05/2016 0630   APPEARANCEUR HAZY (A) 07/05/2016 0630   LABSPEC 1.012 07/05/2016 0630   PHURINE 6.0 07/05/2016 0630   GLUCOSEU >=500 (A) 07/05/2016 0630   HGBUR LARGE (A) 07/05/2016 0630   BILIRUBINUR NEGATIVE 07/05/2016 0630   KETONESUR 20 (A) 07/05/2016 0630   PROTEINUR 30 (A) 07/05/2016 0630   UROBILINOGEN 0.2 01/22/2015 1523   NITRITE NEGATIVE 07/05/2016 0630   LEUKOCYTESUR TRACE (A) 07/05/2016 0630    Radiological Exams on Admission: Dg Chest 2 View  Result Date: 05/01/2018 CLINICAL DATA:  Chest pain and shortness of breath EXAM: CHEST - 2 VIEW COMPARISON:  July 04, 2016  FINDINGS: No edema or consolidation. Heart is upper normal in size with pulmonary vascularity normal. No adenopathy. No pneumothorax. No bone lesions. IMPRESSION: No edema or consolidation.  Heart upper normal in size. Electronically Signed   By: Bretta Bang III M.D.   On: 05/01/2018 20:44   Dg Chest Portable 1 View  Result Date: 05/02/2018 CLINICAL DATA:  Shortness of breath EXAM: PORTABLE CHEST 1 VIEW COMPARISON:  May 01, 2018 FINDINGS: The lungs are clear. Heart is upper normal in size with pulmonary vascularity normal. No adenopathy. No bone lesions. IMPRESSION: No edema or consolidation.  Stable cardiac silhouette. Electronically Signed   By: Bretta Bang III M.D.   On: 05/02/2018 19:05    EKG: Independently reviewed.  Without overt ST elevations or depressions, tachycardic at 110, QTc minimally prolonged at 490  Assessment/Plan Principal Problem:   Suspected Covid-19 Virus Infection Active Problems:   COPD (chronic obstructive pulmonary disease) (HCC)   Acute respiratory failure with hypoxia (HCC)   OSA (obstructive sleep apnea)   Hypokalemia   Lactic acidosis   Sepsis (HCC)   Bipolar 1 disorder (HCC)   Acute hypoxic respiratory failure, likely multifactorial -patient is a COVID-19 rule out Sepsis secondary to COVID-19 infection vs community-acquired pneumonia vs COPD exacerbation -As above patient has known sick contacts, sister is person under investigation for COVID-19 -Patient had cultures/COVID-19 swab collected at previous ED visit last night, continue to follow along -Chest x-ray formally read as unremarkable, questionable right lower lobe pacification on close personal exam -We will treat patient for likely community-acquired pneumonia versus COPD exacerbation with azithromycin and ceftriaxone -Hold steroids, nebs given COVID-19 suspicion -On hydroxychloroquine given patient's QT is currently 490, on multiple psychiatric medications and azithromycin as above  -Hold off on repeat morning labs/imaging to limit exposure -consider every 48h labs if clinically prudent -Continue to wean patient's oxygen as tolerated, recommending ambulation early around the room as tolerated -discussed at length at bedside that patient's hypoxia is the main reason for her admission should this resolve we could potentially discuss discharge home  COVID-19 suspect as above  -COVID-19 swab pending evaluation  last night in the ED -Sister, with whom she lives, is person under investigation currently at home on self quarantine -being treated with hydroxychloroquine -Hydroxychloroquine held as above given QTc 490, on multiple gastric medications as well as azithromycin for CAP/COPD coverage -Continue supportive care at this time patient on 2 L nasal cannula satting quite well -Markable [place] labs, LDH 228, ferritin 27, pro calcitonin negative, patient is neither leukopenic or neutropenic; d-dimer borderline at 0.49, sed rate 23 AST ALT within normal limits(borderline low on repeat labs this afternoon) -Influenza a and B- yesterday, viral panel markable for parainfluenza virus 4  Moderate hyperglycemia without known history of diabetes -Declines history of diabetes, glucose well over 200 on last 3 BMP, A1c pending  Hypokalemia, unclear etiology -Patient indicates nausea mild: However denies any overt vomiting or diarrhea -Received 40 mEq in ED, will supplement with additional 80mEq - should place patient at 4.0 given no over GI or renal losses.  Lactic acidosis, resolved -Last night ED with mild lactic acidosis at 2.0, now downtrending to 1.6, continue increased p.o. intake as tolerated  Bipolar disorder, depression, anxiety -Continue home medications per home med rec  DVT prophylaxis: Lovenox  Code Status: Full  Family Communication: None present Disposition Plan: Inpatient, likely discharge home is clinically improved and no longer hypoxic considering COVID-19 suspect  would consider 5 to 7 days further inpatient evaluation and treatment reasonable.  Consults called: None Admission status: Patient, COVID-19 suspect   Azucena FallenWilliam C  DO [facility]  If 7PM-7AM, please contact night-coverage [email or web address] Password [email or web address]  05/02/2018, 8:51 PM

## 2018-05-02 NOTE — Progress Notes (Signed)
Pt desats to 81% when fully asleep. O2 sat = 91-95% when RN wakes patient.

## 2018-05-02 NOTE — ED Notes (Signed)
ED TO INPATIENT HANDOFF REPORT  ED Nurse Name and Phone #: Romeo Apple, RN 61  S Name/Age/Gender [name] 38 y.o. female Room/Bed: 023C/023C  Code Status   Code Status: Full Code  Home/SNF/Other Home Patient oriented to: self, place, time and situation Is this baseline? Yes   Triage Complete: Triage complete  Chief Complaint Sepsis; R/O COVID  Triage Note Pt here for worsening sob. Pt was admitted yesterday and left AMA this morning to help her daugher. Pt with labored breathing at rest, worsened with exertion.    Allergies Allergies  Allergen Reactions  . Iohexol Cough    Level of Care/Admitting Diagnosis ED Disposition    ED Disposition Condition Comment   Admit  Hospital Area: [facility] [[id number]]  Level of Care: Progressive [102]  Diagnosis: Suspected Covid-19 Virus Infection [[id number]]  Admitting Physician: Azucena Fallen [[id number]]  Attending Physician: Azucena Fallen [[id number]]  Estimated length of stay: 5 - 7 days  Certification:: I certify this patient will need inpatient services for at least 2 midnights  Bed request comments: COVID - HIGH PROB  PT Class (Do Not Modify): Inpatient [101]  PT Acc Code (Do Not Modify): Private [1]       B Medical/Surgery History Past Medical History:  Diagnosis Date  . Anxiety   . Arthritis   . Asthma   . Bipolar 1 disorder (HCC)   . COPD (chronic obstructive pulmonary disease) (HCC)   . Depression   . Emphysema   . Obesity   . Pneumonia   . Rib fracture    Past Surgical History:  Procedure Laterality Date  . CHOLECYSTECTOMY    . TUBAL LIGATION       A IV Location/Drains/Wounds Patient Lines/Drains/Airways Status   Active Line/Drains/Airways    Name:   Placement date:   Placement time:   Site:   Days:   Peripheral IV 05/02/18 Right Hand   05/02/18    1814    Hand   less than 1          Intake/Output Last 24 hours No intake or output data in the 24 hours ending  05/02/18 1912  Labs/Imaging Results for orders placed or performed during the hospital encounter of 05/02/18 (from the past 48 hour(s))  CBC with Differential     Status: Abnormal   Collection Time: 05/02/18  6:01 PM  Result Value Ref Range   WBC 14.2 (H) 4.0 - 10.5 K/uL   RBC 4.36 3.87 - 5.11 MIL/uL   Hemoglobin 12.4 12.0 - 15.0 g/dL   HCT 22.0 25.4 - 27.0 %   MCV 88.3 80.0 - 100.0 fL   MCH 28.4 26.0 - 34.0 pg   MCHC 32.2 30.0 - 36.0 g/dL   RDW 62.3 76.2 - 83.1 %   Platelets 328 150 - 400 K/uL    Comment: REPEATED TO VERIFY SPECIMEN CHECKED FOR CLOTS    nRBC 0.0 0.0 - 0.2 %   Neutrophils Relative % 70 %   Neutro Abs 9.8 (H) 1.7 - 7.7 K/uL   Lymphocytes Relative 20 %   Lymphs Abs 2.9 0.7 - 4.0 K/uL   Monocytes Relative 7 %   Monocytes Absolute 1.0 0.1 - 1.0 K/uL   Eosinophils Relative 3 %   Eosinophils Absolute 0.5 0.0 - 0.5 K/uL   Basophils Relative 0 %   Basophils Absolute 0.0 0.0 - 0.1 K/uL   Immature Granulocytes 0 %   Abs Immature Granulocytes 0.06 0.00 - 0.07 K/uL  Comment: Performed at [facility], [address] [place], [place] [address]  Basic metabolic panel     Status: Abnormal   Collection Time: 05/02/18  6:01 PM  Result Value Ref Range   Sodium 139 135 - 145 mmol/L   Potassium 2.6 (LL) 3.5 - 5.1 mmol/L    Comment: CRITICAL RESULT CALLED TO, READ BACK BY AND VERIFIED WITH: Json Koelzer, P RN @ 1907 ON 05/02/2018 BY TEMOCHE, H    Chloride 100 98 - 111 mmol/L   CO2 27 22 - 32 mmol/L   Glucose, Bld 143 (H) 70 - 99 mg/dL   BUN <5 (L) 6 - 20 mg/dL   Creatinine, Ser 6.04 0.44 - 1.00 mg/dL   Calcium 8.5 (L) 8.9 - 10.3 mg/dL   GFR calc non Af Amer >60 >60 mL/min   GFR calc Af Amer >60 >60 mL/min   Anion gap 12 5 - 15    Comment: Performed at [facility], [address] [place], [place] [address]  Lactic acid, plasma     Status: None   Collection Time: 05/02/18  6:15 PM  Result Value Ref Range   Lactic Acid, Venous 1.3 0.5 - 1.9 mmol/L     Comment: Performed at [facility], [address] [place], [place] [address]   Dg Chest 2 View  Result Date: 05/01/2018 CLINICAL DATA:  Chest pain and shortness of breath EXAM: CHEST - 2 VIEW COMPARISON:  July 04, 2016 FINDINGS: No edema or consolidation. Heart is upper normal in size with pulmonary vascularity normal. No adenopathy. No pneumothorax. No bone lesions. IMPRESSION: No edema or consolidation.  Heart upper normal in size. Electronically Signed   By: Bretta Bang III M.D.   On: 05/01/2018 20:44   Dg Chest Portable 1 View  Result Date: 05/02/2018 CLINICAL DATA:  Shortness of breath EXAM: PORTABLE CHEST 1 VIEW COMPARISON:  May 01, 2018 FINDINGS: The lungs are clear. Heart is upper normal in size with pulmonary vascularity normal. No adenopathy. No bone lesions. IMPRESSION: No edema or consolidation.  Stable cardiac silhouette. Electronically Signed   By: Bretta Bang III M.D.   On: 05/02/2018 19:05    Pending Labs Unresulted Labs (From admission, onward)    Start     Ordered   05/09/18 0500  Creatinine, serum  (enoxaparin (LOVENOX)    CrCl >/= 30 ml/min)  Weekly,   R    Comments:  while on enoxaparin therapy    05/02/18 1855   05/02/18 1847  CBC  (enoxaparin (LOVENOX)    CrCl >/= 30 ml/min)  Once,   R    Comments:  Baseline for enoxaparin therapy IF NOT ALREADY DRAWN.  Notify MD if PLT < 100 K.    05/02/18 1855   05/02/18 1847  Creatinine, serum  (enoxaparin (LOVENOX)    CrCl >/= 30 ml/min)  Once,   R    Comments:  Baseline for enoxaparin therapy IF NOT ALREADY DRAWN.    05/02/18 1855   05/02/18 1801  Lactic acid, plasma  Now then every 2 hours,   STAT     05/02/18 1800          Vitals/Pain Today's Vitals   05/02/18 1815 05/02/18 1815 05/02/18 1830 05/02/18 1845  BP: 112/76  112/66 111/68  Pulse: (!) 103  (!) 104 (!) 105  Resp: 19  (!) 27   Temp:      TempSrc:      SpO2: 98%  94% 96%  Weight:      Height:      PainSc:  9       Isolation  Precautions Droplet and Contact precautions  Medications Medications  enoxaparin (LOVENOX) injection 40 mg (has no administration in time range)  Ipratropium-Albuterol (COMBIVENT) respimat 1 puff (has no administration in time range)  azithromycin (ZITHROMAX) 500 mg in sodium chloride 0.9 % 250 mL IVPB (has no administration in time range)  cefTRIAXone (ROCEPHIN) 1 g in sodium chloride 0.9 % 100 mL IVPB (has no administration in time range)  beclomethasone (QVAR) 80 MCG/ACT inhaler 2 puff (has no administration in time range)  clonazePAM (KLONOPIN) tablet 1 mg (has no administration in time range)  escitalopram (LEXAPRO) tablet 20 mg (has no administration in time range)  gabapentin (NEURONTIN) capsule 300 mg (has no administration in time range)  guaiFENesin (MUCINEX) 12 hr tablet 600 mg (has no administration in time range)  traZODone (DESYREL) tablet 100 mg (has no administration in time range)  lurasidone (LATUDA) tablet 80 mg (has no administration in time range)  albuterol (PROVENTIL HFA;VENTOLIN HFA) 108 (90 Base) MCG/ACT inhaler 10 puff (10 puffs Inhalation Given 05/02/18 1817)  acetaminophen (TYLENOL) tablet 650 mg (650 mg Oral Given 05/02/18 1816)    Mobility walks Low fall risk   Focused Assessments Pulmonary Assessment Handoff:  Lung sounds: Bilateral Breath Sounds: Clear, Diminished L Breath Sounds: Clear, Diminished R Breath Sounds: Clear, Diminished O2 Device: Nasal Cannula O2 Flow Rate (L/min): 2 L/min      R Recommendations: See Admitting Provider Note  Report given to: 2W  Additional Notes: Pt is aware that the MD wants her to be able to keep her sats up & as soon as she can she will be D/C.

## 2018-05-02 NOTE — ED Notes (Signed)
Report given to 2W RN. All questions answered 

## 2018-05-02 NOTE — Progress Notes (Signed)
Pt left AMA this morning. Pt angry that she had urinated on herself and no one came to help. Pt was ambulated to bathrom after admission to this unit. Bariatric bedside commode was also obtained and placed beside patient's bed earlier this morning. Pt was independent with activity after set-up. RN was unaware of patient's call for bathroom.   Pt refused to sign AMA form. Pt departure witnessed by multiplew RNs, dayshift charge RN, nightshift RN, and MD.

## 2018-05-03 DIAGNOSIS — J410 Simple chronic bronchitis: Secondary | ICD-10-CM

## 2018-05-03 DIAGNOSIS — G4733 Obstructive sleep apnea (adult) (pediatric): Secondary | ICD-10-CM

## 2018-05-03 DIAGNOSIS — E876 Hypokalemia: Secondary | ICD-10-CM

## 2018-05-03 DIAGNOSIS — J9601 Acute respiratory failure with hypoxia: Secondary | ICD-10-CM

## 2018-05-03 DIAGNOSIS — E872 Acidosis: Secondary | ICD-10-CM

## 2018-05-03 DIAGNOSIS — F319 Bipolar disorder, unspecified: Secondary | ICD-10-CM

## 2018-05-03 LAB — COMPREHENSIVE METABOLIC PANEL
ALT: 17 U/L (ref 0–44)
AST: 15 U/L (ref 15–41)
Albumin: 3.1 g/dL — ABNORMAL LOW (ref 3.5–5.0)
Alkaline Phosphatase: 80 U/L (ref 38–126)
Anion gap: 10 (ref 5–15)
BUN: 6 mg/dL (ref 6–20)
CO2: 30 mmol/L (ref 22–32)
Calcium: 8.8 mg/dL — ABNORMAL LOW (ref 8.9–10.3)
Chloride: 102 mmol/L (ref 98–111)
Creatinine, Ser: 0.55 mg/dL (ref 0.44–1.00)
GFR calc Af Amer: >60 mL/min (ref >60)
GFR calc non Af Amer: >60 mL/min (ref >60)
Glucose, Bld: 192 mg/dL — ABNORMAL HIGH (ref 70–99)
Potassium: 3.2 mmol/L — ABNORMAL LOW (ref 3.5–5.1)
Sodium: 142 mmol/L (ref 135–145)
Total Bilirubin: 0.5 mg/dL (ref 0.3–1.2)
Total Protein: 6.1 g/dL — ABNORMAL LOW (ref 6.5–8.1)

## 2018-05-03 LAB — BLOOD GAS, ARTERIAL
Acid-Base Excess: 6.6 mmol/L — ABNORMAL HIGH (ref 0.0–2.0)
Bicarbonate: 31.8 mmol/L — ABNORMAL HIGH (ref 20.0–28.0)
Drawn by: 249101
O2 Content: 15 L/min
O2 Saturation: 95.5 %
Patient temperature: 98.6
pCO2 arterial: 56.8 mmHg — ABNORMAL HIGH (ref 32.0–48.0)
pH, Arterial: 7.367 (ref 7.350–7.450)
pO2, Arterial: 81.1 mmHg — ABNORMAL LOW (ref 83.0–108.0)

## 2018-05-03 LAB — CBC WITH DIFFERENTIAL/PLATELET
Abs Immature Granulocytes: 0.07 10*3/uL (ref 0.00–0.07)
Basophils Absolute: 0 10*3/uL (ref 0.0–0.1)
Basophils Relative: 0 %
Eosinophils Absolute: 0.3 10*3/uL (ref 0.0–0.5)
Eosinophils Relative: 2 %
HCT: 39 % (ref 36.0–46.0)
Hemoglobin: 12.2 g/dL (ref 12.0–15.0)
Immature Granulocytes: 1 %
Lymphocytes Relative: 17 %
Lymphs Abs: 2.4 10*3/uL (ref 0.7–4.0)
MCH: 27.5 pg (ref 26.0–34.0)
MCHC: 31.3 g/dL (ref 30.0–36.0)
MCV: 88 fL (ref 80.0–100.0)
Monocytes Absolute: 0.8 10*3/uL (ref 0.1–1.0)
Monocytes Relative: 6 %
Neutro Abs: 10.3 10*3/uL — ABNORMAL HIGH (ref 1.7–7.7)
Neutrophils Relative %: 74 %
Platelets: 336 10*3/uL (ref 150–400)
RBC: 4.43 MIL/uL (ref 3.87–5.11)
RDW: 15.3 % (ref 11.5–15.5)
WBC: 13.9 10*3/uL — ABNORMAL HIGH (ref 4.0–10.5)
nRBC: 0 % (ref 0.0–0.2)

## 2018-05-03 LAB — MAGNESIUM: Magnesium: 2 mg/dL (ref 1.7–2.4)

## 2018-05-03 LAB — MRSA PCR SCREENING: MRSA by PCR: POSITIVE — AB

## 2018-05-03 LAB — PHOSPHORUS: Phosphorus: 2.8 mg/dL (ref 2.5–4.6)

## 2018-05-03 MED ORDER — ACETAMINOPHEN 325 MG PO TABS
650.0000 mg | ORAL_TABLET | Freq: Once | ORAL | Status: AC
  Administered 2018-05-03: 650 mg via ORAL
  Filled 2018-05-03: qty 2

## 2018-05-03 MED ORDER — CHLORHEXIDINE GLUCONATE CLOTH 2 % EX PADS
6.0000 | MEDICATED_PAD | Freq: Every day | CUTANEOUS | Status: DC
  Administered 2018-05-03: 6 via TOPICAL

## 2018-05-03 MED ORDER — AZITHROMYCIN 500 MG PO TABS: 500.0000 mg | ORAL_TABLET | Freq: Every day | ORAL | 0 refills | Status: AC

## 2018-05-03 MED ORDER — ZINC SULFATE 220 (50 ZN) MG PO CAPS: 220.0000 mg | ORAL_CAPSULE | Freq: Two times a day (BID) | ORAL | 0 refills | Status: DC

## 2018-05-03 MED ORDER — FENTANYL CITRATE (PF) 100 MCG/2ML IJ SOLN
INTRAMUSCULAR | Status: AC
  Filled 2018-05-03: qty 2

## 2018-05-03 MED ORDER — CEFDINIR 300 MG PO CAPS: 300.0000 mg | ORAL_CAPSULE | Freq: Two times a day (BID) | ORAL | 0 refills | Status: AC

## 2018-05-03 MED ORDER — GUAIFENESIN ER 600 MG PO TB12: 1200.0000 mg | ORAL_TABLET | Freq: Two times a day (BID) | ORAL | 0 refills | Status: DC

## 2018-05-03 MED ORDER — MUPIROCIN 2 % EX OINT: 1.0000 "application " | TOPICAL_OINTMENT | Freq: Two times a day (BID) | CUTANEOUS | Status: DC

## 2018-05-03 MED ORDER — ZINC SULFATE 220 (50 ZN) MG PO CAPS: 220.0000 mg | ORAL_CAPSULE | Freq: Two times a day (BID) | ORAL | Status: DC

## 2018-05-03 MED ORDER — PANTOPRAZOLE SODIUM 40 MG PO TBEC
40.0000 mg | DELAYED_RELEASE_TABLET | Freq: Every day | ORAL | Status: DC
  Administered 2018-05-03: 40 mg via ORAL
  Filled 2018-05-03: qty 1

## 2018-05-03 MED ORDER — MIDAZOLAM HCL 2 MG/2ML IJ SOLN
INTRAMUSCULAR | Status: AC
  Filled 2018-05-03: qty 2

## 2018-05-03 MED ORDER — MUPIROCIN 2 % EX OINT: 1.0000 "application " | TOPICAL_OINTMENT | Freq: Two times a day (BID) | CUTANEOUS | 0 refills | Status: DC

## 2018-05-03 MED ORDER — POTASSIUM CHLORIDE CRYS ER 20 MEQ PO TBCR
40.0000 meq | EXTENDED_RELEASE_TABLET | Freq: Two times a day (BID) | ORAL | Status: DC
  Administered 2018-05-03: 40 meq via ORAL
  Filled 2018-05-03: qty 2

## 2018-05-03 MED ORDER — ORAL CARE MOUTH RINSE
15.0000 mL | Freq: Two times a day (BID) | OROMUCOSAL | Status: DC
  Administered 2018-05-03: 15 mL via OROMUCOSAL

## 2018-05-03 NOTE — Progress Notes (Signed)
Pt.continued to be lethargic & difficult to arouse & 02 sat.70's -80's;rapid response nurse & NP Craige Cotta made aware  & both came to evaluate pt..ABG done.Pt.was placed on ETCO2 monitor.NP Craige Cotta came & evaluated pt. For possible intubation & for transfer to 24M.Dr.Scatliffe came & evaluated pt.York Spaniel pt.does not require intubation at this time or move to higher level of care .Will continue to monitor pt.

## 2018-05-03 NOTE — Discharge Summary (Addendum)
Physician Discharge Summary  Janylah Humphery Hoganson SEL:[id number] DOB: 1980/11/03 DOA: 05/02/2018  PCP: Rometta Emery, MD  Admit date: 05/02/2018 Discharge date: 05/03/2018  Admitted From: Home Disposition: Home  Recommendations for Outpatient Follow-up:  1. Follow up with PCP in 1-2 weeks 2. Follow up with Pulmonary Dr. Maple Hudson on May 4th at 11:00 AM 3. Please obtain CMP/CBC, Mag, Phos in one week 4. Please follow up on the following pending results:  Home Health: No  Equipment/Devices: None    Discharge Condition: Stable  CODE STATUS: FULL CODE Diet recommendation: Heart Healthy Carb Modified Diet  Brief/Interim Summary: HPI per Dr. Carma Leaven on 05/03/2018 Anzal L Ende is a 38 y.o. female with medical history significant of bipolar disorder, COPD on room air, hypertension, sleep apnea but not yet on CPAP, anxiety, depression who presents back to the ED this evening after leaving AMA from our facility last night.  Presented yesterday with similar episodes of worsening shortness of breath cough fever, notably her daughter as well as sister are both sick (coughing,self reported fevers) and her sister, with whom she lives, is currently under investigation for COVID-19.  Patient has been using her inhalers and nebulizers at home with very little to no improvement in her symptoms.  Patient admits as above to shortness of breath, cough, fever as high as 102 recorded at home as well as poor taste and poor smell -these complaints have been worsening over the past 48 hours otherwise declines nausea, vomiting, diarrhea, constipation, headache.  Remains hypoxic on room air requiring 2 L nasal cannula, hospitalist was contacted for admission.  Interim History Overnight a rapid response was called on the patient to her being obtunded.  She desaturated into the 60s and had to be placed on 6 L nasal cannula after stimulation.  A stat ABG was done and high flow nasal cannula was applied with  end-tidal CO2 monitoring.  Pulmonary was consulted and her sedating medications were discontinued.  ABG showed primary respiratory acidosis with compensated by chronic metabolic acidosis.  This morning her oxygen saturation significantly improved and she was weaned off of nasal cannula.  When I assessed her this morning she was agitated and crying and on room air saturating mid 90s.  Patient stated that she wanted to go home and states that she knows that she has obstructive sleep apnea but not have a CPAP at home and is in the process of getting set up for CPAP.  She was not hypoxic on ambulation after ambulating for 45 minutes in the room and had significantly improved since admission.  She was deemed medically stable to be discharged as she is no longer hypoxic.  It was concerning that she desaturated last night but she will be set up with pulmonary in outpatient setting for outpatient sleep study evaluation for CPAP.  She will be discharged home on antibiotics with p.o. azithromycin as well as p.o. cefdinir to complete her course for 7 days.  Patient did test positive for parainfluenza virus and has exposures to COVID and it was advised for her to be socially isolated and COVID instructions were given to her at discharge.  She was also advised that her respiratory status worsen or she is started spiking more fever she should come back to the ED for re-presentation and evaluation and she is understandable agreeable to plan.  She is currently deemed medically stable to be discharged home and is longer hypoxic.  Likely her episode was related to her obstructive sleep apnea  in the setting of parainfluenza virus but she will still need to be ruled out for COVID 19 disease and recommended self isolation now that she is no longer hypoxic or febrile.   Discharge Diagnoses:  Principal Problem:   Suspected Covid-19 Virus Infection Active Problems:   COPD (chronic obstructive pulmonary disease) (HCC)   Acute  respiratory failure with hypoxia (HCC)   OSA (obstructive sleep apnea)   Hypokalemia   Lactic acidosis   Sepsis (HCC)   Bipolar 1 disorder (HCC)  Acute hypoxic respiratory failure, likely multifactorial in the setting of CAP/COPD/ OSA/OHS as well as the patient being a COVID-19 rule out, improved  Sepsis secondary to COVID-19 infection vs community-acquired pneumonia vs COPD exacerbation, improved -As above patient has known sick contacts, sister is person under investigation for COVID-19 -Patient had cultures/COVID-19 swab collected at previous ED visit last night, continue to follow along -Chest x-ray formally read as unremarkable, questionable right lower lobe opacification on Admitter's read. Repeat CXR this AM showed "The lungs are clear. Heart is upper normal in size with pulmonary vascularity normal. No adenopathy. No bone lesions." -We will treat patient for likely community-acquired pneumonia versus COPD exacerbation with azithromycin and ceftriaxone while hospitalized and continue po Azithromycin and po Cefdinir at D/C  -MRSA PCR was Positive  -Hold steroids, nebs given COVID-19 suspicion -Not on hydroxychloroquine given patient's QT is currently 490, on multiple psychiatric medications and azithromycin as above -Repeat morning labs unremarkable except Blood Sugar and K+ -Continued to wean patient's oxygen as tolerated, recommending ambulation early around the room as tolerated  -WBC is trending down and went from 14.2 -> 13.9 -Discussed at length at bedside that patient's hypoxia is the main reason for her admission and since this resolved we will  discharge home with repeat CXR in 3-6 Weeks and follow up closely with PCP   COVID-19 suspect as above  -COVID-19 swab pending evaluation last night in the ED and will be pending at D/C; Will need PCP to follow up Result -Sister, with whom she lives, is person under investigation currently at home on self quarantine -being treated with  hydroxychloroquine.  -Hydroxychloroquine held as above given QTc 490, on multiple gastric medications as well as azithromycin for CAP/COPD coverage and will continue po At D/C  -Continue supportive care at this time patient is no longer on Supplemental O2  -ReMarkable COVID labs, LDH 228, ferritin 27, pro calcitonin negative, patient is neither leukopenic or neutropenic; d-dimer borderline at 0.49, sed rate 23 AST ALT within normal limits  limits(borderline low on repeat labs this afternoon) -Influenza a and B were negative, Viral panel was remarkable for parainfluenza virus 4 -COVID Isolation HomeCare Instructions given  OSA/OHS -Had Nocturnal Desaturations while sleeping and improved this AM -Held Sedating medications -Needs formal Sleep Study as an outpatient -Pulmonary Appointment scheduled for May 4th 2020 at 11:00 AM with Dr. Maple Hudson  Hyperglycemia in the setting of new onset diabetes Mellitus type 2 -Declined history of diabetes,  -Glucose well over 200 on last 3 BMP, -HbA1c was 8.4 -Weight loss and dietary education given.  At this time will defer to PCP to initiate treatment for her diabetes.  Hypokalemia, unclear etiology -Patient indicates nausea mild: However denies any overt vomiting or diarrhea -Received 40 mEq in ED, will supplement with additional -repeat potassium this morning was 3.2 and she was given another dose of repletion prior to discharge.  Lactic acidosis, resolved -Last night ED with mild lactic acidosis at 2.0, now downtrending  to 1.6, continue increased p.o. intake as tolerated  Bipolar disorder, depression, anxiety -Continued home medications per home med rec sedating medications were held.  She will need to follow-up with PCP to adjust these as necessary  Super Morbid Obesity -Estimated body mass index is 62.09 kg/m as calculated from the following:   Height as of this encounter: 5\' 6"  (1.676 m).   Weight as of this encounter: 174.5 kg. -Weight  Loss and Dietary Counseling Given   Discharge Instructions  Discharge Instructions    Call MD for:  difficulty breathing, headache or visual disturbances   Complete by:  As directed    Call MD for:  extreme fatigue   Complete by:  As directed    Call MD for:  hives   Complete by:  As directed    Call MD for:  persistant dizziness or light-headedness   Complete by:  As directed    Call MD for:  persistant nausea and vomiting   Complete by:  As directed    Call MD for:  redness, tenderness, or signs of infection (pain, swelling, redness, odor or green/yellow discharge around incision site)   Complete by:  As directed    Call MD for:  severe uncontrolled pain   Complete by:  As directed    Call MD for:  temperature >100.4   Complete by:  As directed    Diet - low sodium heart healthy   Complete by:  As directed    Diet Carb Modified   Complete by:  As directed    Discharge instructions   Complete by:  As directed    You were cared for by a hospitalist during your hospital stay. If you have any questions about your discharge medications or the care you received while you were in the hospital after you are discharged, you can call the unit and ask to speak with the hospitalist on call if the hospitalist that took care of you is not available. Once you are discharged, your primary care physician will handle any further medical issues. Please note that NO REFILLS for any discharge medications will be authorized once you are discharged, as it is imperative that you return to your primary care physician (or establish a relationship with a primary care physician if you do not have one) for your aftercare needs so that they can reassess your need for medications and monitor your lab values.  Follow up with PCP and Pulmonary in the outpatient setting. Take all medications as prescribed. If symptoms change or worsen please return to the ED for evaluation   Person Under Monitoring Name: Myranda Pavone  Constantin  Location: [address] [place] [address]   Infection Prevention Recommendations for Individuals Confirmed to have, or Being Evaluated for, 2019 Novel Coronavirus (COVID-19) Infection Who Receive Care at Home  Individuals who are confirmed to have, or are being evaluated for, COVID-19 should follow the prevention steps below until a healthcare provider or local or state health department says they can return to normal activities.  Stay home except to get medical care You should restrict activities outside your home, except for getting medical care. Do not go to work, school, or public areas, and do not use public transportation or taxis.  Call ahead before visiting your doctor Before your medical appointment, call the healthcare provider and tell them that you have, or are being evaluated for, COVID-19 infection. This will help the healthcare provider's office take steps to keep other  people from getting infected. Ask your healthcare provider to call the local or state health department.  Monitor your symptoms Seek prompt medical attention if your illness is worsening (e.g., difficulty breathing). Before going to your medical appointment, call the healthcare provider and tell them that you have, or are being evaluated for, COVID-19 infection. Ask your healthcare provider to call the local or state health department.  Wear a facemask You should wear a facemask that covers your nose and mouth when you are in the same room with other people and when you visit a healthcare provider. People who live with or visit you should also wear a facemask while they are in the same room with you.  Separate yourself from other people in your home As much as possible, you should stay in a different room from other people in your home. Also, you should use a separate bathroom, if available.  Avoid sharing household items You should not share dishes, drinking glasses, cups, eating  utensils, towels, bedding, or other items with other people in your home. After using these items, you should wash them thoroughly with soap and water.  Cover your coughs and sneezes Cover your mouth and nose with a tissue when you cough or sneeze, or you can cough or sneeze into your sleeve. Throw used tissues in a lined trash can, and immediately wash your hands with soap and water for at least 20 seconds or use an alcohol-based hand rub.  Wash your [employer] your hands often and thoroughly with soap and water for at least 20 seconds. You can use an alcohol-based hand sanitizer if soap and water are not available and if your hands are not visibly dirty. Avoid touching your eyes, nose, and mouth with unwashed hands.   Prevention Steps for Caregivers and Household Members of Individuals Confirmed to have, or Being Evaluated for, COVID-19 Infection Being Cared for in the Home  If you live with, or provide care at home for, a person confirmed to have, or being evaluated for, COVID-19 infection please follow these guidelines to prevent infection:  Follow healthcare provider's instructions Make sure that you understand and can help the patient follow any healthcare provider instructions for all care.  Provide for the patient's basic needs You should help the patient with basic needs in the home and provide support for getting groceries, prescriptions, and other personal needs.  Monitor the patient's symptoms If they are getting sicker, call his or her medical provider and tell them that the patient has, or is being evaluated for, COVID-19 infection. This will help the healthcare provider's office take steps to keep other people from getting infected. Ask the healthcare provider to call the local or state health department.  Limit the number of people who have contact with the patient If possible, have only one caregiver for the patient. Other household members should stay in another  home or place of residence. If this is not possible, they should stay in another room, or be separated from the patient as much as possible. Use a separate bathroom, if available. Restrict visitors who do not have an essential need to be in the home.  Keep older adults, very young children, and other sick people away from the patient Keep older adults, very young children, and those who have compromised immune systems or chronic health conditions away from the patient. This includes people with chronic heart, lung, or kidney conditions, diabetes, and cancer.  Ensure good ventilation Make sure that shared spaces  in the home have good air flow, such as from an air conditioner or an opened window, weather permitting.  Wash your hands often Wash your hands often and thoroughly with soap and water for at least 20 seconds. You can use an alcohol based hand sanitizer if soap and water are not available and if your hands are not visibly dirty. Avoid touching your eyes, nose, and mouth with unwashed hands. Use disposable paper towels to dry your hands. If not available, use dedicated cloth towels and replace them when they become wet.  Wear a facemask and gloves Wear a disposable facemask at all times in the room and gloves when you touch or have contact with the patient's blood, body fluids, and/or secretions or excretions, such as sweat, saliva, sputum, nasal mucus, vomit, urine, or feces.  Ensure the mask fits over your nose and mouth tightly, and do not touch it during use. Throw out disposable facemasks and gloves after using them. Do not reuse. Wash your hands immediately after removing your facemask and gloves. If your personal clothing becomes contaminated, carefully remove clothing and launder. Wash your hands after handling contaminated clothing. Place all used disposable facemasks, gloves, and other waste in a lined container before disposing them with other household waste. Remove gloves and  wash your hands immediately after handling these items.  Do not share dishes, glasses, or other household items with the patient Avoid sharing household items. You should not share dishes, drinking glasses, cups, eating utensils, towels, bedding, or other items with a patient who is confirmed to have, or being evaluated for, COVID-19 infection. After the person uses these items, you should wash them thoroughly with soap and water.  Wash laundry thoroughly Immediately remove and wash clothes or bedding that have blood, body fluids, and/or secretions or excretions, such as sweat, saliva, sputum, nasal mucus, vomit, urine, or feces, on them. Wear gloves when handling laundry from the patient. Read and follow directions on labels of laundry or clothing items and detergent. In general, wash and dry with the warmest temperatures recommended on the label.  Clean all areas the individual has used often Clean all touchable surfaces, such as counters, tabletops, doorknobs, bathroom fixtures, toilets, phones, keyboards, tablets, and bedside tables, every day. Also, clean any surfaces that may have blood, body fluids, and/or secretions or excretions on them. Wear gloves when cleaning surfaces the patient has come in contact with. Use a diluted bleach solution (e.g., dilute bleach with 1 part bleach and 10 parts water) or a household disinfectant with a label that says EPA-registered for coronaviruses. To make a bleach solution at home, add 1 tablespoon of bleach to 1 quart (4 cups) of water. For a larger supply, add  cup of bleach to 1 gallon (16 cups) of water. Read labels of cleaning products and follow recommendations provided on product labels. Labels contain instructions for safe and effective use of the cleaning product including precautions you should take when applying the product, such as wearing gloves or eye protection and making sure you have good ventilation during use of the product. Remove gloves  and wash hands immediately after cleaning.  Monitor yourself for signs and symptoms of illness Caregivers and household members are considered close contacts, should monitor their health, and will be asked to limit movement outside of the home to the extent possible. Follow the monitoring steps for close contacts listed on the symptom monitoring form.   ? If you have additional questions, contact your local health department  or call the epidemiologist on call at [phone] (available 24/7). ? This guidance is subject to change. For the most up-to-date guidance from [facility], please refer to their website: [email or web address]   Increase activity slowly   Complete by:  As directed      Allergies as of 05/03/2018      Reactions   Iohexol Cough      Medication List    TAKE these medications   albuterol 108 (90 Base) MCG/ACT inhaler Commonly known as:  PROVENTIL HFA;VENTOLIN HFA Inhale 2 puffs into the lungs every 6 (six) hours as needed. For shortness of breath.   azithromycin 500 MG tablet Commonly known as:  Zithromax Take 1 tablet (500 mg total) by mouth daily for 5 days. Take 1 tablet daily for 3 days.   beclomethasone 80 MCG/ACT inhaler Commonly known as:  QVAR Inhale 2 puffs into the lungs 2 (two) times daily.   cefdinir 300 MG capsule Commonly known as:  OMNICEF Take 1 capsule (300 mg total) by mouth 2 (two) times daily for 5 days.   clonazePAM 0.5 MG tablet Commonly known as:  KLONOPIN Take 2 tablets (1 mg total) by mouth 2 (two) times daily as needed for anxiety (take 1mg  every am and .5mg  every pm ).   cyclobenzaprine 10 MG tablet Commonly known as:  FLEXERIL Take 1 tablet (10 mg total) by mouth 2 (two) times daily as needed for muscle spasms.   escitalopram 20 MG tablet Commonly known as:  LEXAPRO Take 20 mg by mouth daily. Notes to patient:  As Before, As Directed   gabapentin 300 MG capsule Commonly known  as:  NEURONTIN Take 300 mg by mouth 2 (two) times daily.   guaiFENesin 600 MG 12 hr tablet Commonly known as:  MUCINEX Take 2 tablets (1,200 mg total) by mouth 2 (two) times daily. What changed:    how much to take  when to take this  reasons to take this   HYDROcodone-homatropine 5-1.5 MG/5ML syrup Commonly known as:  HYCODAN Take 5 mLs by mouth every 6 (six) hours as needed for cough.   ipratropium-albuterol 0.5-2.5 (3) MG/3ML Soln Commonly known as:  DUONEB Take 3 mLs by nebulization every 4 (four) hours as needed. What changed:  reasons to take this   lurasidone 80 MG Tabs tablet Commonly known as:  LATUDA Take 80 mg by mouth daily with breakfast.   mupirocin ointment 2 % Commonly known as:  BACTROBAN Place 1 application into the nose 2 (two) times daily.   omeprazole 40 MG capsule Commonly known as:  PRILOSEC Take 40 mg by mouth daily.   traZODone 50 MG tablet Commonly known as:  DESYREL Take 100 mg by mouth daily.   zinc sulfate 220 (50 Zn) MG capsule Take 1 capsule (220 mg total) by mouth 2 (two) times daily. Start taking on:  May 04, 2018      Follow-up Information    Rometta Emery, MD. Call.   Specialty:  Internal Medicine Why:  Follow up within 1 week Contact information: [address] [place] [address] [phone]        [facility]. Call.   Specialty:  Pulmonology Why:  Follow up for COPD, and Suspected OSA/OHS Contact information: [address] [place] [address] [phone]         Allergies  Allergen Reactions  . Iohexol Cough   Consultations:  PCCM/Pulmonary  Procedures/Studies: Dg Chest 2 View  Result Date: 05/01/2018 CLINICAL  DATA:  Chest pain and shortness of breath EXAM: CHEST - 2 VIEW COMPARISON:  July 04, 2016 FINDINGS: No edema or consolidation. Heart is upper normal in size with pulmonary vascularity normal. No adenopathy. No pneumothorax. No bone lesions.  IMPRESSION: No edema or consolidation.  Heart upper normal in size. Electronically Signed   By: Bretta Bang III M.D.   On: 05/01/2018 20:44   Dg Chest Portable 1 View  Result Date: 05/02/2018 CLINICAL DATA:  Shortness of breath EXAM: PORTABLE CHEST 1 VIEW COMPARISON:  May 01, 2018 FINDINGS: The lungs are clear. Heart is upper normal in size with pulmonary vascularity normal. No adenopathy. No bone lesions. IMPRESSION: No edema or consolidation.  Stable cardiac silhouette. Electronically Signed   By: Bretta Bang III M.D.   On: 05/02/2018 19:05      Subjective: Seen and examined at bedside and she was doing much better and no longer hypoxic.  She was awake, alert, oriented and wanting to go home and self isolate.  She ambulated around the room without issues and did not desaturate.  She was deemed medically stable to be discharged and she will be continued on p.o. antibiotics at discharge and is advised her that if she spikes any temperature or becomes more short of breath to come back for re-presentation she understands and agrees with the plan of care.  Discharge Exam: Vitals:   05/03/18 0945 05/03/18 0946  BP:    Pulse: 99 (!) 101  Resp:    Temp:    SpO2: 94% 93%   Vitals:   05/03/18 0758 05/03/18 0800 05/03/18 0945 05/03/18 0946  BP: 129/87     Pulse: 90 87 99 (!) 101  Resp:  (!) 25    Temp: 98 F (36.7 C)     TempSrc: Oral     SpO2: 96% 92% 94% 93%  Weight:      Height:       General: Pt is alert, awake, not in acute distress Cardiovascular: Mildly tachycardic, S1/S2 +, no rubs, no gallops Respiratory: Diminished bilaterally, no wheezing, no rhonchi Abdominal: Soft, NT, Distended due to body habitus, bowel sounds + Extremities: Trace LE nonpitting edema, no cyanosis  The results of significant diagnostics from this hospitalization (including imaging, microbiology, ancillary and laboratory) are listed below for reference.    Microbiology: Recent Results (from  the past 240 hour(s))  Blood Culture (routine x 2)     Status: None (Preliminary result)   Collection Time: 05/01/18  8:30 PM  Result Value Ref Range Status   Specimen Description BLOOD LEFT ARM  Final   Special Requests   Final    BOTTLES DRAWN AEROBIC AND ANAEROBIC Blood Culture adequate volume   Culture   Final    NO GROWTH 2 DAYS Performed at [facility], [address] [place], [place] [address]    Report Status PENDING  Incomplete  Respiratory Panel by PCR     Status: Abnormal   Collection Time: 05/01/18  8:52 PM  Result Value Ref Range Status   Adenovirus NOT DETECTED NOT DETECTED Final   Coronavirus 229E NOT DETECTED NOT DETECTED Final    Comment: (NOTE) The Coronavirus on the Respiratory Panel, DOES NOT test for the novel  Coronavirus (2019 nCoV)    Coronavirus HKU1 NOT DETECTED NOT DETECTED Final   Coronavirus NL63 NOT DETECTED NOT DETECTED Final   Coronavirus OC43 NOT DETECTED NOT DETECTED Final   Metapneumovirus NOT DETECTED NOT DETECTED Final   Rhinovirus /  Enterovirus NOT DETECTED NOT DETECTED Final   Influenza A NOT DETECTED NOT DETECTED Final   Influenza B NOT DETECTED NOT DETECTED Final   Parainfluenza Virus 1 NOT DETECTED NOT DETECTED Final   Parainfluenza Virus 2 NOT DETECTED NOT DETECTED Final   Parainfluenza Virus 3 NOT DETECTED NOT DETECTED Final   Parainfluenza Virus 4 DETECTED (A) NOT DETECTED Final   Respiratory Syncytial Virus NOT DETECTED NOT DETECTED Final   Bordetella pertussis NOT DETECTED NOT DETECTED Final   Chlamydophila pneumoniae NOT DETECTED NOT DETECTED Final   Mycoplasma pneumoniae NOT DETECTED NOT DETECTED Final    Comment: Performed at [facility], [address] [place], [place] [address]  Blood Culture (routine x 2)     Status: None (Preliminary result)   Collection Time: 05/01/18  9:04 PM  Result Value Ref Range Status   Specimen Description BLOOD RIGHT ARM  Final   Special Requests   Final    BOTTLES DRAWN AEROBIC  ONLY Blood Culture adequate volume   Culture   Final    NO GROWTH 2 DAYS Performed at [facility], [address], [place], [place] [address]    Report Status PENDING  Incomplete  MRSA PCR Screening     Status: Abnormal   Collection Time: 05/02/18  9:45 PM  Result Value Ref Range Status   MRSA by PCR POSITIVE (A) NEGATIVE Final    Comment:        The GeneXpert MRSA Assay (FDA approved for NASAL specimens only), is one component of a comprehensive MRSA colonization surveillance program. It is not intended to diagnose MRSA infection nor to guide or monitor treatment for MRSA infections. RESULT CALLED TO, READ BACK BY AND VERIFIED WITH: Truman Hayward RN 05/03/18 0027 JDW Performed at [facility], [address] [place], [place] [address]    Labs: BNP (last 3 results) Recent Labs    05/01/18 2044  BNP 40.9   Basic Metabolic Panel: Recent Labs  Lab 05/01/18 2043 05/01/18 2302 05/02/18 0439 05/02/18 1801 05/03/18 0821  NA 137  --  139 139 142  K 2.4*  --  2.4* 2.6* 3.2*  CL 96*  --  101 100 102  CO2 30  --  GLUCOSE 205*  --  203* 143* 192*  BUN 5*  --  5* <5* 6  CREATININE 0.68 0.66 0.64 0.64 0.55  CALCIUM 8.6*  --  8.2* 8.5* 8.8*  MG  --   --   --   --  2.0  PHOS  --   --   --   --  2.8   Liver Function Tests: Recent Labs  Lab 05/01/18 2043 05/02/18 0439 05/03/18 0821  AST 17 14* 15  ALT ALKPHOS 78 76 80  BILITOT 0.4 0.3 0.5  PROT 6.5 5.8* 6.1*  ALBUMIN 3.2* 2.9* 3.1*   No results for input(s): LIPASE, AMYLASE in the last 168 hours. No results for input(s): AMMONIA in the last 168 hours. CBC: Recent Labs  Lab 05/01/18 2003 05/01/18 2302 05/02/18 0439 05/02/18 1801 05/03/18 0821  WBC 14.2* 13.3* 12.0* 14.2* 13.9*  NEUTROABS  --   --  8.8* 9.8* 10.3*  HGB 12.9 12.1 12.0 12.4 12.2  HCT 40.8 38.6 38.1 38.5 39.0  MCV 87.2 87.5 87.2 88.3 88.0  PLT 366 329 328 328 336   Cardiac Enzymes: Recent Labs  Lab 05/01/18 2003   TROPONINI <0.03   BNP:  Invalid input(s): POCBNP CBG: No results for input(s): GLUCAP in the last 168 hours. D-Dimer Recent Labs    05/01/18 2229  DDIMER 0.49   Hgb A1c Recent Labs    05/02/18 2052  HGBA1C 8.4*   Lipid Profile No results for input(s): CHOL, HDL, LDLCALC, TRIG, CHOLHDL, LDLDIRECT in the last 72 hours. Thyroid function studies No results for input(s): TSH, T4TOTAL, T3FREE, THYROIDAB in the last 72 hours.  Invalid input(s): FREET3 Anemia work up Recent Labs    05/01/18 2302  FERRITIN 27   Urinalysis    Component Value Date/Time   COLORURINE RED (A) 07/05/2016 0630   APPEARANCEUR HAZY (A) 07/05/2016 0630   LABSPEC 1.012 07/05/2016 0630   PHURINE 6.0 07/05/2016 0630   GLUCOSEU >=500 (A) 07/05/2016 0630   HGBUR LARGE (A) 07/05/2016 0630   BILIRUBINUR NEGATIVE 07/05/2016 0630   KETONESUR 20 (A) 07/05/2016 0630   PROTEINUR 30 (A) 07/05/2016 0630   UROBILINOGEN 0.2 01/22/2015 1523   NITRITE NEGATIVE 07/05/2016 0630   LEUKOCYTESUR TRACE (A) 07/05/2016 0630   Sepsis Labs Invalid input(s): PROCALCITONIN,  WBC,  LACTICIDVEN Microbiology Recent Results (from the past 240 hour(s))  Blood Culture (routine x 2)     Status: None (Preliminary result)   Collection Time: 05/01/18  8:30 PM  Result Value Ref Range Status   Specimen Description BLOOD LEFT ARM  Final   Special Requests   Final    BOTTLES DRAWN AEROBIC AND ANAEROBIC Blood Culture adequate volume   Culture   Final    NO GROWTH 2 DAYS Performed at [facility], [address] [place], [place] [address]    Report Status PENDING  Incomplete  Respiratory Panel by PCR     Status: Abnormal   Collection Time: 05/01/18  8:52 PM  Result Value Ref Range Status   Adenovirus NOT DETECTED NOT DETECTED Final   Coronavirus 229E NOT DETECTED NOT DETECTED Final    Comment: (NOTE) The Coronavirus on the Respiratory Panel, DOES NOT test for the novel  Coronavirus (2019 nCoV)    Coronavirus HKU1 NOT  DETECTED NOT DETECTED Final   Coronavirus NL63 NOT DETECTED NOT DETECTED Final   Coronavirus OC43 NOT DETECTED NOT DETECTED Final   Metapneumovirus NOT DETECTED NOT DETECTED Final   Rhinovirus / Enterovirus NOT DETECTED NOT DETECTED Final   Influenza A NOT DETECTED NOT DETECTED Final   Influenza B NOT DETECTED NOT DETECTED Final   Parainfluenza Virus 1 NOT DETECTED NOT DETECTED Final   Parainfluenza Virus 2 NOT DETECTED NOT DETECTED Final   Parainfluenza Virus 3 NOT DETECTED NOT DETECTED Final   Parainfluenza Virus 4 DETECTED (A) NOT DETECTED Final   Respiratory Syncytial Virus NOT DETECTED NOT DETECTED Final   Bordetella pertussis NOT DETECTED NOT DETECTED Final   Chlamydophila pneumoniae NOT DETECTED NOT DETECTED Final   Mycoplasma pneumoniae NOT DETECTED NOT DETECTED Final    Comment: Performed at [facility], [address], [place], [place] [address]  Blood Culture (routine x 2)     Status: None (Preliminary result)   Collection Time: 05/01/18  9:04 PM  Result Value Ref Range Status   Specimen Description BLOOD RIGHT ARM  Final   Special Requests   Final    BOTTLES DRAWN AEROBIC ONLY Blood Culture adequate volume   Culture   Final    NO GROWTH 2 DAYS Performed at [facility], [address] [place], [place] [address]    Report Status PENDING  Incomplete  MRSA PCR Screening     Status: Abnormal   Collection Time: 05/02/18  9:45 PM  Result Value Ref Range Status   MRSA by PCR POSITIVE (A) NEGATIVE Final    Comment:        The GeneXpert MRSA Assay (FDA approved for NASAL specimens only), is one component of a comprehensive MRSA colonization surveillance program. It is not intended to diagnose MRSA infection nor to guide or monitor treatment for MRSA infections. RESULT CALLED TO, READ BACK BY AND VERIFIED WITH: Truman HaywardM BROOKS RN 05/03/18 0027 JDW Performed at [facility], [address] [place], [place] [id number]    Time coordinating discharge: 35  minutes  SIGNED:  Merlene Laughtermair Latif , DO [facility] 05/03/2018, 12:11 PM Pager is on AMION  If 7PM-7AM, please contact night-coverage [email or web address] Password [email or web address]

## 2018-05-03 NOTE — Consult Note (Signed)
NAME:  TAYVA MARICH, MRN:  [id number], DOB:  18-Sep-1980, LOS: 1 ADMISSION DATE:  05/02/2018, CONSULTATION DATE:  05/03/18 REFERRING MD:  Natale Milch  CHIEF COMPLAINT:  SOB, cough, fever   Brief History   [name] is a 38 y.o. female who was admitted initially 4/7 as nCoV rule out but left AMA 4/8.  Returned later same day with worsening symptoms including increased O2 requirements.  Received trazodone late PM 4/8, then early AM 4/9 was less responsive so PCCM asked to see.  History of present illness   Pt is hypersomnolent; therefore, this HPI is obtained from chart review and nursing staff. [name] is a 38 y.o. female who has a PMH as outlined below including but not limited to bipolar disorder, COPD, OSA not on CPAP, HTN (see "past medical history").  She was admitted 4/7 as a COVID rule out but left AMA on 4/8. She then returned back to ED later on 4/8 with worsening SOB, cough, fever (Tmax reportedly 102).  Pt also reportedly has had loss of smell and taste over the past 48 hours.  Apparently daughter and sister are also sick with cough and fever.  Sister who lives with her is currently a PUI for COVID-19 and is self isolating. Otherwise, no additional symptoms.  In ED, she was hypoxic to 81% on room air.  She was on 2L initially which was then increased to 4, 6, and finally 10L.  She received trazodone around 2200 and early AM 4/9, she was found to be minimally responsive. ABG was obtained and was 7.36 / 57 / 81.  PCCM was called and asked to see in consultation.  Upon our arrival, Dr. Carlota Raspberry was able to arouse pt who was able to answer correctly to person and place orientation questions.  ETCO2 50's, RR 20 - 25.  Dr. Carlota Raspberry felt that pt was stable to remain on Progressive [facility] where she currently is.  Past Medical History  OSA, COPD, Bipolar, anxiety.  Significant Hospital Events   4/7 > admitted, left AMA 4/8. 4/9 > re-admitted.  Consults:  PCCM.   Procedures:  None.  Significant Diagnostic Tests:  CXR 4/8 > normal.  Micro Data:  Blood 4/7 > nCoV 4/7 >  RVP 4/7 > parainfluenza  Antimicrobials:  Azithromycin 4/9 >  Ceftriaxone 4/9 >    Interim history/subjective:  Somnolent but arouses to voice.  ABG without significant hypercapnia. ETCO2 50 with RR 20 - 25 range.  Objective:  Blood pressure 130/78, pulse 73, temperature 98.3 F (36.8 C), resp. rate (!) 30, height 5\' 6"  (1.676 m), weight (!) 167.8 kg, last menstrual period 04/17/2018, SpO2 (!) 54 %.        Intake/Output Summary (Last 24 hours) at 05/03/2018 0228 Last data filed at 05/03/2018 0047 Gross per 24 hour  Intake -  Output 1000 ml  Net -1000 ml   Filed Weights   05/02/18 1757  Weight: (!) 167.8 kg    Examination (per Dr. Lottie Dawson exam / assessment): General: Obese female, in NAD. Neuro: Somnolent but arouses to voice and answers questions.  Oriented to person and place. HEENT: Short neck.  Foster/AT. Sclerae anicteric. . Cardiovascular: RRR, no M/R/G.  Lungs: Respirations even and unlabored.  CTAB.  ETCO2 50's, RR 20-25. Abdomen: Obese.  BS x 4, soft, NT/ND.  Musculoskeletal: No gross deformities, no edema.  Skin: Intact, warm, no rashes.  Assessment & Plan:   Acute hypoxic respiratory failure - high risk for nCoV given  symptoms and exposure to PUI.  Can not rule out component CAP at this point; though, PCT and WBC reassuring.  RVP positive for parainfluenza.  Hypoxia / hypoventilation also exacerbated by OSA / OHS. - Continue supplemental O2 as needed to maintain SpO2 > 92%. - No CPAP due to nCoV rule out. - If remains requiring high O2 after wakes up / by AM, might need to consider transfer to ICU for intubation. - Continue azithromycin / ceftriaxone. - Hydroxychloroquine on hold for now due to QTc 489.  Monitor daily QTc and assess for readiness to start hyroxychloroquine. - Add zinc.  Hypokalemia - s/p repletion. - F/u BMP.  Hyperglycemia - no  known hx DM. - F/u A1c. - SSI if glucose consistently > 180.  Hx bipolar disorder. - Continue preadmission lurasidone. - Hold preadmission clonazepam, cyclobenzaprine, escitalopram, gabapentin, trazodone.  Hypersomnolence. - Follow ETCO2 (currently OK at 50). - Hold sedating meds as above.  Best Practice:  Diet: NPO for tonight. Pain/Anxiety/Delirium protocol (if indicated): N/A. VAP protocol (if indicated): N/A. DVT prophylaxis: SCD's / Enoxaparin. GI prophylaxis: N/A. Glucose control: N/A. Mobility: Bedrest. Code Status: Full. Family Communication: None available. Disposition: Progressive.  Labs   CBC: Recent Labs  Lab 05/01/18 2003 05/01/18 2302 05/02/18 0439 05/02/18 1801  WBC 14.2* 13.3* 12.0* 14.2*  NEUTROABS  --   --  8.8* 9.8*  HGB 12.9 12.1 12.0 12.4  HCT 40.8 38.6 38.1 38.5  MCV 87.2 87.5 87.2 88.3  PLT 366 329 328 328   Basic Metabolic Panel: Recent Labs  Lab 05/01/18 2043 05/01/18 2302 05/02/18 0439 05/02/18 1801  NA 137  --  139 139  K 2.4*  --  2.4* 2.6*  CL 96*  --  101 100  CO2 30  --  28 27  GLUCOSE 205*  --  203* 143*  BUN 5*  --  5* <5*  CREATININE 0.68 0.66 0.64 0.64  CALCIUM 8.6*  --  8.2* 8.5*   GFR: Estimated Creatinine Clearance: 154.6 mL/min (by C-G formula based on SCr of 0.64 mg/dL). Recent Labs  Lab 05/01/18 2003 05/01/18 2104 05/01/18 2302 05/02/18 0439 05/02/18 1801 05/02/18 1815 05/02/18 2309  PROCALCITON  --   --  <0.10  --   --   --   --   WBC 14.2*  --  13.3* 12.0* 14.2*  --   --   LATICACIDVEN  --  2.0* 1.6  --   --  1.3 1.3   Liver Function Tests: Recent Labs  Lab 05/01/18 2043 05/02/18 0439  AST 17 14*  ALT 19 17  ALKPHOS 78 76  BILITOT 0.4 0.3  PROT 6.5 5.8*  ALBUMIN 3.2* 2.9*   No results for input(s): LIPASE, AMYLASE in the last 168 hours. No results for input(s): AMMONIA in the last 168 hours. ABG    Component Value Date/Time   PHART 7.367 05/03/2018 0130   PCO2ART 56.8 (H) 05/03/2018 0130    PO2ART 81.1 (L) 05/03/2018 0130   HCO3 31.8 (H) 05/03/2018 0130   O2SAT 95.5 05/03/2018 0130    Coagulation Profile: No results for input(s): INR, PROTIME in the last 168 hours. Cardiac Enzymes: Recent Labs  Lab 05/01/18 2003  TROPONINI <0.03   HbA1C: Hgb A1c MFr Bld  Date/Time Value Ref Range Status  05/02/2018 08:52 PM 8.4 (H) 4.8 - 5.6 % Final    Comment:    (NOTE) Pre diabetes:          5.7%-6.4% Diabetes:              >  6.4% Glycemic control for   <7.0% adults with diabetes    CBG: No results for input(s): GLUCAP in the last 168 hours.  Review of Systems:   Unable to obtain as pt is somnolent.  Past medical history  She,  has a past medical history of Anxiety, Arthritis, Asthma, Bipolar 1 disorder (HCC), COPD (chronic obstructive pulmonary disease) (HCC), Depression, Emphysema, Obesity, Pneumonia, and Rib fracture.   Surgical History    Past Surgical History:  Procedure Laterality Date  . CHOLECYSTECTOMY    . TUBAL LIGATION       Social History   reports that she quit smoking about 6 years ago. She smoked 0.00 packs per day. She has never used smokeless tobacco. She reports that she does not drink alcohol or use drugs.   Family history   Her family history is not on file.   Allergies Allergies  Allergen Reactions  . Iohexol Cough     Home meds  Prior to Admission medications   Medication Sig Start Date End Date Taking? Authorizing Provider  albuterol (PROVENTIL HFA;VENTOLIN HFA) 108 (90 Base) MCG/ACT inhaler Inhale 2 puffs into the lungs every 6 (six) hours as needed. For shortness of breath. 07/05/16   Kathlen ModyAkula, Vijaya, MD  beclomethasone (QVAR) 80 MCG/ACT inhaler Inhale 2 puffs into the lungs 2 (two) times daily.    [provider]  clonazePAM (KLONOPIN) 0.5 MG tablet Take 2 tablets (1 mg total) by mouth 2 (two) times daily as needed for anxiety (take 1mg  every am and .5mg  every pm ). 01/29/11 05/01/18  Jethro Bastosrawford, Anne W, NP  cyclobenzaprine  (FLEXERIL) 10 MG tablet Take 1 tablet (10 mg total) by mouth 2 (two) times daily as needed for muscle spasms. 01/20/18   Robinson, [place] N, PA-C  escitalopram (LEXAPRO) 20 MG tablet Take 20 mg by mouth daily.      [provider]  gabapentin (NEURONTIN) 300 MG capsule Take 300 mg by mouth 2 (two) times daily.     [provider]  guaiFENesin (MUCINEX) 600 MG 12 hr tablet Take 600 mg by mouth daily as needed for cough or to loosen phlegm.    [provider]  HYDROcodone-homatropine (HYCODAN) 5-1.5 MG/5ML syrup Take 5 mLs by mouth every 6 (six) hours as needed for cough. Patient not taking: Reported on 01/22/2015 09/01/14   Mady GemmaWestfall, Elizabeth C, PA-C  ipratropium-albuterol (DUONEB) 0.5-2.5 (3) MG/3ML SOLN Take 3 mLs by nebulization every 4 (four) hours as needed. Patient taking differently: Take 3 mLs by nebulization every 4 (four) hours as needed (shortness of breath).  07/05/16   Kathlen ModyAkula, Vijaya, MD  lurasidone (LATUDA) 80 MG TABS tablet Take 80 mg by mouth daily with breakfast.    [provider]  omeprazole (PRILOSEC) 40 MG capsule Take 40 mg by mouth daily. 04/04/18   [provider]  traZODone (DESYREL) 50 MG tablet Take 100 mg by mouth daily.  03/30/18   [provider]     Rutherford Guysahul Madysin Crisp, PA - [facility]ens  Pulmonary & Critical Care Medicine Pager: [phone].  If no answer, [phone] 05/03/2018, 2:28 AM

## 2018-05-03 NOTE — Significant Event (Signed)
Rapid Response Event Note RN Called due to lack of responsiveness and acute hypoxia with sats 60s  Overview: Time Called: 0112 Arrival Time: 0114 Event Type: Respiratory  Initial Focused Assessment: Upon arrival, [name] is obtunded, arousable to painful stimuli. Tachypneic with frequent airway obstructions due to her OSA. After stimulation, she immediately falls back asleep and cannot stay awake. She briefly answers questions.  When she is obstructed, she will drop her sats to 70% while I am at the bedside.  Her sats come back up to 94% on 6L [place] after stimulation. ABG 7.36/57/81/32 on HFNC   Interventions: -Stat ABG -HFNC with ETCO2  Plan of Care (if not transferred): -PCCM Consult for possible transfer to ICU  Event Summary: Name of Physician Notified: K.kirby-Graham NP came to bedside at 0145    at    Outcome: Transferred (Comment)     [name]

## 2018-05-03 NOTE — Progress Notes (Signed)
Pt.had several episodes of bradycardia & pauses in heart monitor NP Craige Cotta  made aware.

## 2018-05-03 NOTE — Progress Notes (Signed)
Ambulated in room on room air with pulse ox monitor. Sats remain 92 - 95% on room air with ambulation in room X 45 min. Dr. Lisabeth Register notified via [facility] text messaging.

## 2018-05-03 NOTE — Progress Notes (Addendum)
Upon arrival from ED pt.was on 2L [place] .AAO  X 4 ,ambulatory ,VS stable .Then approximately an hour after pt.status changed.Pt.O2 SAT.decreased to 60's & difficult to aroused & less responsive.Oxygen slowly increased to 4L HFNC.but still  O2 SAT,is below 90"s.Rapid Response nurse Onalee Hua was called & came to see pt.

## 2018-05-03 NOTE — Discharge Instructions (Signed)
Antibiotic Medicine, Adult ° °Antibiotic medicines are used to treat infections caused by bacteria, such as strep throat and urinary tract infection (UTI). Antibiotic medicines will not work for viral illnesses, such as colds or the flu (influenza). They work by killing the bacteria that is making you sick. Antibiotics can also have serious side effects. It is important that you take antibiotic medicines safely and only when needed. °When do I need to take antibiotics? °Antibiotics are medicines that treat bacterial infections. You may need antibiotics for: °· UTI. °· Strep throat. °· Meningitis. This infection affects the spinal cord and brain. °· Bacterial sinusitis. °· Serious lung infection. °You may start antibiotics while your health care provider waits for test results to come back. Common tests may include throat, urine, blood, or mucus culture. Your health care provider may change or stop the antibiotic depending on your test results. °When are antibiotics not needed? °You do not need antibiotics for most common illnesses. These illnesses may be caused by a virus, not a bacteria. You do not need antibiotics for: °· The common cold. °· Influenza. °· Sore throat. °· Discolored mucus. °· Bronchitis. °Antibiotics are not always needed for all bacterial infections. Many of these infections clear up without antibiotic treatment. Do not ask for or take antibiotics when they are not necessary. °How long should I take the antibiotic? °You must take the entire prescription. Continue to take your antibiotic for as long as told by your health care provider. Do not stop taking it even if you start to feel better. If you stop taking it too soon: °· You may start to feel sick again. °· Your infection may become harder to treat. °· Complications may develop. °Each course of antibiotics needs a different amount of time to work. Some antibiotic courses last only a few days. Some last about a week to 10 days. In some cases,  you may need to take antibiotics for a few weeks to completely treat the infection. °What if I miss a dose? °Try not to miss any doses of medicine. If you miss a dose, call your health care provider or pharmacist for advice. Sometimes it is okay to take the missed dose as soon as possible. °What are the risks of taking antibiotics? °Most antibiotics can cause an infection called Clostridioides difficile (C. difficile or C. diff), which causes severe diarrhea. This infection happens when the antibiotics kill the healthy bacteria in your intestines. This allows C. diff to grow. The infection needs to be treated right away. Let your health care provider know if: °· You have diarrhea while taking an antibiotic. °· You have diarrhea after you stop taking an antibiotic. C. diff infection can start weeks after stopping the antibiotic. °Taking an antibiotic also puts you at risk for getting a bacteria that does not respond to medicine (antibiotic-resistant infection) in the future. Antibiotics can cause bacteria to change so that if the antibiotic is taken again, the medicine is not able to kill the bacteria. These infections can be more serious and, in some cases, life-threatening. °Do antibiotics affect birth control? °Birth control pills may not work while you are on antibiotics. If you are taking birth control pills, continue taking them as usual and use a second form of birth control, such as a condom, to avoid unwanted pregnancy. Continue using the second form of birth control until your health care provider says you can stop. °What else should I know about taking antibiotics? °It is important for you to   take antibiotics exactly as told. Make sure that you:  Take the entire course of antibiotic that was prescribed. Do not stop taking your antibiotics even if your symptoms improve.  Take the correct amount of medicine each day.  Ask your health care provider: ? How long to wait in between doses. ? If the  antibiotic should be taken with food. ? If there are any foods, drinks, or medicines that you should avoid while taking the antibiotics. ? If there are any side effects you should be aware of.  Only use the antibiotics prescribed for you by your health care provider. Do not use antibiotics prescribed for someone else.  Drink a large glass of water along with the antibiotics.  Ask the pharmacist for a syringe, cup, or spoon that properly measures the antibiotics.  Throw away any leftover medicine. Contact a health care provider if:  Your symptoms get worse.  You have new joint pain or muscle aches that begin after starting the antibiotic. When should I seek immediate medical care?  You have signs of a serious allergic reaction to antibiotics. If you have signs of a severe allergic reaction, stop taking the antibiotic right away. Signs may include: ? Hives, which are raised, itchy, red bumps on the skin. ? Skin rash. ? Trouble breathing. ? A wheezing sound when you breathe. ? Swelling anywhere on your body. ? Feeling dizzy. ? Vomiting.  Your urine turns dark or becomes blood-colored.  Your skin turns yellow.  You bruise or bleed easily.  You have severe diarrhea and abdominal cramps.  You have a severe headache. Summary  Antibiotic medicines are used to treat infections caused by bacteria, such as strep throat and UTIs. It is important that you take antibiotic medicines only when needed.  Your health care provider may change or stop the antibiotic depending on your test results.  Most antibiotics can cause an infection called Clostridioides difficile (C. difficile or C. diff), which causes severe diarrhea. Let your health care provider know if you develop diarrhea while taking an antibiotic.  Take the entire course of antibiotic that was prescribed. This information is not intended to replace advice given to you by your health care provider. Make sure you discuss any  questions you have with your health care provider. Document Released: 09/23/2003 Document Revised: 07/11/2017 Document Reviewed: 01/12/2016 Elsevier Interactive Patient Education  2019 [employer]   Acute Respiratory Failure, Adult  Acute respiratory failure occurs when there is not enough oxygen passing from your lungs to your body. When this happens, your lungs have trouble removing carbon dioxide from the blood. This causes your blood oxygen level to drop too low as carbon dioxide builds up. Acute respiratory failure is a medical emergency. It can develop quickly, but it is temporary if treated promptly. Your lung capacity, or how much air your lungs can hold, may improve with time, exercise, and treatment. What are the causes? There are many possible causes of acute respiratory failure, including:  Lung injury.  Chest injury or damage to the ribs or tissues near the lungs.  Lung conditions that affect the flow of air and blood into and out of the lungs, such as pneumonia, acute respiratory distress syndrome, and cystic fibrosis.  Medical conditions, such as strokes or spinal cord injuries, that affect the muscles and nerves that control breathing.  Blood infection (sepsis).  Inflammation of the pancreas (pancreatitis).  A blood clot in the lungs (pulmonary embolism).  A large-volume blood transfusion.  Burns.  Near-drowning.  Seizure.  Smoke inhalation.  Reaction to medicines.  Alcohol or drug overdose. What increases the risk? This condition is more likely to develop in people who have:  A blocked airway.  Asthma.  A condition or disease that damages or weakens the muscles, nerves, bones, or tissues that are involved in breathing.  A serious infection.  A health problem that blocks the unconscious reflex that is involved in breathing, such as hypothyroidism or sleep apnea.  A lung injury or trauma. What are the signs or symptoms? Trouble breathing is the  main symptom of acute respiratory failure. Symptoms may also include:  Rapid breathing.  Restlessness or anxiety.  Skin, lips, or fingernails that appear blue (cyanosis).  Rapid heart rate.  Abnormal heart rhythms (arrhythmias).  Confusion or changes in behavior.  Tiredness or loss of energy.  Feeling sleepy or having a loss of consciousness. How is this diagnosed? Your health care provider can diagnose acute respiratory failure with a medical history and physical exam. During the exam, your health care provider will listen to your heart and check for crackling or wheezing sounds in your lungs. Your may also have tests to confirm the diagnosis and determine what is causing respiratory failure. These tests may include:  Measuring the amount of oxygen in your blood (pulse oximetry). The measurement comes from a small device that is placed on your finger, earlobe, or toe.  Other blood tests to measure blood gases and to look for signs of infection.  Sampling your cerebral spinal fluid or tracheal fluid to check for infections.  Chest X-ray to look for fluid in spaces that should be filled with air.  Electrocardiogram (ECG) to look at the heart's electrical activity. How is this treated? Treatment for this condition usually takes places in a hospital intensive care unit (ICU). Treatment depends on what is causing the condition. It may include one or more treatments until your symptoms improve. Treatment may include:  Supplemental oxygen. Extra oxygen is given through a tube in the nose, a face mask, or a hood.  A device such as a continuous positive airway pressure (CPAP) or bi-level positive airway pressure (BiPAP or BPAP) machine. This treatment uses mild air pressure to keep the airways open. A mask or other device will be placed over your nose or mouth. A tube that is connected to a motor will deliver oxygen through the mask.  Ventilator. This treatment helps move air into and out  of the lungs. This may be done with a bag and mask or a machine. For this treatment, a tube is placed in your windpipe (trachea) so air and oxygen can flow to the lungs.  Extracorporeal membrane oxygenation (ECMO). This treatment temporarily takes over the function of the heart and lungs, supplying oxygen and removing carbon dioxide. ECMO gives the lungs a chance to recover. It may be used if a ventilator is not effective.  Tracheostomy. This is a procedure that creates a hole in the neck to insert a breathing tube.  Receiving fluids and medicines.  Rocking the bed to help breathing. Follow these instructions at home:  Take over-the-counter and prescription medicines only as told by your health care provider.  Return to normal activities as told by your health care provider. Ask your health care provider what activities are safe for you.  Keep all follow-up visits as told by your health care provider. This is important. How is this prevented? Treating infections and medical conditions that may lead to acute  respiratory failure can help prevent the condition from developing. Contact a health care provider if:  You have a fever.  Your symptoms do not improve or they get worse. Get help right away if:  You are having trouble breathing.  You lose consciousness.  Your have cyanosis or turn blue.  You develop a rapid heart rate.  You are confused. These symptoms may represent a serious problem that is an emergency. Do not wait to see if the symptoms will go away. Get medical help right away. Call your local emergency services (911 in the [place] Do not drive yourself to the hospital. This information is not intended to replace advice given to you by your health care provider. Make sure you discuss any questions you have with your health care provider. Document Released: 01/15/2013 Document Revised: 08/08/2015 Document Reviewed: 07/29/2015 Elsevier Interactive Patient Education  2019  [employer]   Antibiotic Medicine, Adult  Antibiotic medicines treat infections caused by a type of germ called bacteria. They work by killing the bacteria that make you sick. When do I need to take antibiotics? You often need these medicines to treat bacterial infections, such as:  A urinary tract infection (UTI).  Strep throat.  Meningitis. This affects the spinal cord and brain.  A bad lung infection. You may start the medicines while your doctor waits for tests to come back. When the tests come back, your doctor may change or stop your medicine. When are antibiotics not needed? You do not need these medicines for most common illnesses, such as:  A cold.  The flu.  A sore throat. Antibiotics are not always needed for all infections caused by bacteria. Do not ask for these medicines, or take them, when they are not needed. What are the risks of taking antibiotics? Most antibiotics can cause an infection called Clostridioides difficile (C. diff).This causes watery poop (diarrhea). Let your doctor know right away if:  You have watery poop while taking an antibiotic.  You have watery poop after you stop taking an antibiotic. The illness can happen weeks after you stop the medicine. You also have a risk of getting an infection in the future that antibiotics cannot treat (antibiotic-resistant infection). This type of infection can be dangerous. What else should I know about taking antibiotics?   You need to take the entire prescription. ? Take the medicine for as long as told by your doctor. ? Do not stop taking it even if you start to feel better.  Try not to miss any doses. If you miss a dose, call your doctor.  Birth control pills may not work. If you take birth control pills: ? Keep on taking them. ? Use a second form of birth control, such as a condom. Do this for as long as told by your doctor.  Ask your doctor: ? How long to wait in between doses. ? If you should  take the medicine with food. ? If there is anything you should stay away from while taking the antibiotic, such as: ? Food. ? Drinks. ? Medicines. ? If there are any side effects you should watch for.  Only take the medicines that your doctor told you to take. Do not take medicines that were given to someone else.  Drink a large glass of water with the medicine.  Ask the pharmacist for a tool to measure the medicine, such as: ? A syringe. ? A cup. ? A spoon.  Throw away any extra medicine. Contact a doctor  if:  You get worse.  You have new joint pain or muscle aches after starting the medicine.  You have side effects from the medicine, such as: ? Stomach pain. ? Watery poop. ? Feeling sick to your stomach (nausea). Get help right away if:  You have signs of a very bad allergic reaction. If this happens, stop taking the medicine right away. Signs may include: ? Hives. These are raised, itchy, red bumps on the skin. ? Skin rash. ? Trouble breathing. ? Wheezing. ? Swelling. ? Feeling dizzy. ? Throwing up (vomiting).  Your pee (urine) is dark, or is the color of blood.  Your skin turns yellow.  You bruise easily.  You bleed easily.  You have very bad watery poop and cramps in your belly.  You have a very bad headache. Summary  Antibiotics are often used to treat infections caused by bacteria.  Only take these medicines when needed.  Let your doctor know if you have watery poop while taking an antibiotic.  You need to take the entire prescription. This information is not intended to replace advice given to you by your health care provider. Make sure you discuss any questions you have with your health care provider. Document Released: 10/20/2007 Document Revised: 07/11/2017 Document Reviewed: 01/13/2016 Elsevier Interactive Patient Education  2019 [employer]   Acute Respiratory Distress Syndrome, Adult  Acute respiratory distress syndrome is a  life-threatening condition in which fluid collects in the lungs. This prevents the lungs from filling with air and passing oxygen into the blood. This can cause the lungs and other vital organs to fail. The condition usually develops following an infection, illness, surgery, or injury. What are the causes? This condition may be caused by:  An infection, such as sepsis or pneumonia.  A serious injury to the head or chest.  Severe bleeding from an injury.  A major surgery.  Breathing in harmful chemicals or smoke.  Blood transfusions.  A blood clot in the lungs.  Breathing in vomit (aspiration).  Near-drowning.  Inflammation of the pancreas (pancreatitis).  A drug overdose. What are the signs or symptoms? Sudden shortness of breath and rapid breathing are the main symptoms of this condition. Other symptoms may include:  A fast or irregular heartbeat.  Skin, lips, or fingernails that look blue (cyanosis).  Confusion.  Tiredness or loss of energy.  Chest pain, particularly while taking a breath.  Coughing.  Restlessness or anxiety.  Fever. This is usually present if there is an underlying infection, such as pneumonia. How is this diagnosed? This condition is diagnosed based on:  Your symptoms.  Medical history.  A physical exam. During the exam, your health care provider will listen to your heart and check for crackling or wheezing sounds in your lungs. You may also have other tests to confirm the diagnosis and measure how well your lungs are working. These may include:  Measuring the amount of oxygen in your blood. Your health care provider will use two methods to do this procedure: ? A small device (pulse oximeter) that is placed on your finger, earlobe, or toe. ? An arterial blood gas test. A sample of blood is taken from an artery and tested for oxygen levels.  Blood tests.  Chest X-rays or CT scans to look for fluid in the lungs.  Taking a sample of your  sputum to test for infection.  Heart test, such as an echocardiogram or electrocardiogram. This is done to rule out any heart problems (such  as heart failure) that may be causing your symptoms.  Bronchoscopy. During this test, a thin, flexible tube with a light is passed into the mouth or nose, down the windpipe, and into the lungs. How is this treated? Treatment depends on the cause of your condition. The goal is to support you while your lungs heal and the underlying cause is treated. Treatment may include:  Oxygen therapy. This may be done through: ? A tube in your nose or a face mask. ? A ventilator. This device helps move air into and out of your lungs through a breathing tube that is inserted into your mouth or nose.  Continuous positive airway pressure (CPAP). This treatment uses mild air pressure to keep the airways open. A mask or other device will be placed over your nose or mouth.  Tracheostomy. During this procedure, a small cut is made in your neck to create an opening to your windpipe. A breathing tube is placed directly into your windpipe. The breathing tube is connected to a ventilator. This is done if you have problems with your airway or if you need a ventilator for a long period of time.  Positioning you to lie on your stomach (prone position).  Medicines, such as: ? Sedatives to help you relax. ? Blood pressure medicines. ? Antibiotics to treat infection. ? Blood thinners to prevent blood clots. ? Diuretics to help prevent excess fluid.  Fluids and nutrients given through an IV tube.  Wearing compression stockings on your legs to prevent blood clots.  Extra corporeal membrane oxygenation (ECMO). This treatment takes blood outside your body, adds oxygen, and removes carbon dioxide. The blood is then returned to your body. This treatment is only used in severe cases. Follow these instructions at home:  Take over-the-counter and prescription medicines only as told by  your health care provider.  Do not use any products that contain nicotine or tobacco, such as cigarettes and e-cigarettes. If you need help quitting, ask your health care provider.  Limit alcohol intake to no more than 1 drink per day for nonpregnant women and 2 drinks per day for men. One drink equals 12 oz of beer, 5 oz of wine, or 1 oz of hard liquor.  Ask friends and family to help you if daily activities make you tired.  Attend any pulmonary rehabilitation as told by your health care provider. This may include: ? Education about your condition. ? Exercises. ? Breathing training. ? Counseling. ? Learning techniques to conserve energy. ? Nutrition counseling.  Keep all follow-up visits as told by your health care provider. This is important. Contact a health care provider if:  You become short of breath during activity or while resting.  You develop a cough that does not go away.  You have a fever.  Your symptoms do not get better or they get worse.  You become anxious or depressed. Get help right away if:  You have sudden shortness of breath.  You develop sudden chest pain that does not go away.  You develop a rapid heart rate.  You develop swelling or pain in one of your legs.  You cough up blood.  You have trouble breathing.  Your skin, lips, or fingernails turn blue. These symptoms may represent a serious problem that is an emergency. Do not wait to see if the symptoms will go away. Get medical help right away. Call your local emergency services (911 in the [place] Do not drive yourself to the hospital. Summary  Acute respiratory distress syndrome is a life-threatening condition in which fluid collects in the lungs, which leads the lungs and other vital organs to fail.  This condition usually develops following an infection, illness, surgery, or injury.  Sudden shortness of breath and rapid breathing are the main symptoms of acute respiratory distress  syndrome.  Treatment may include oxygen therapy, continuous positive airway pressure (CPAP), tracheostomy, lying on your stomach (prone position), medicines, fluids and nutrients given through an IV tube, compression stockings, and extra corporeal membrane oxygenation (ECMO). This information is not intended to replace advice given to you by your health care provider. Make sure you discuss any questions you have with your health care provider. Document Released: 01/10/2005 Document Revised: 12/28/2015 Document Reviewed: 12/28/2015 Elsevier Interactive Patient Education  2019 [employer].

## 2018-05-03 NOTE — Plan of Care (Signed)

## 2018-05-04 LAB — NOVEL CORONAVIRUS, NAA (HOSP ORDER, SEND-OUT TO REF LAB; TAT 18-24 HRS): SARS-CoV-2, NAA: NOT DETECTED

## 2018-05-06 LAB — CULTURE, BLOOD (ROUTINE X 2)
Culture: NO GROWTH
Culture: NO GROWTH
Special Requests: ADEQUATE
Special Requests: ADEQUATE

## 2018-05-06 IMAGING — DX DG CHEST 1V PORT
1 series · 1 of 1 positions shown · non-contrast
Comparison: 10/18/2014

CLINICAL DATA: Cough and fever

EXAM:
PORTABLE CHEST 1 VIEW

[chest ap]
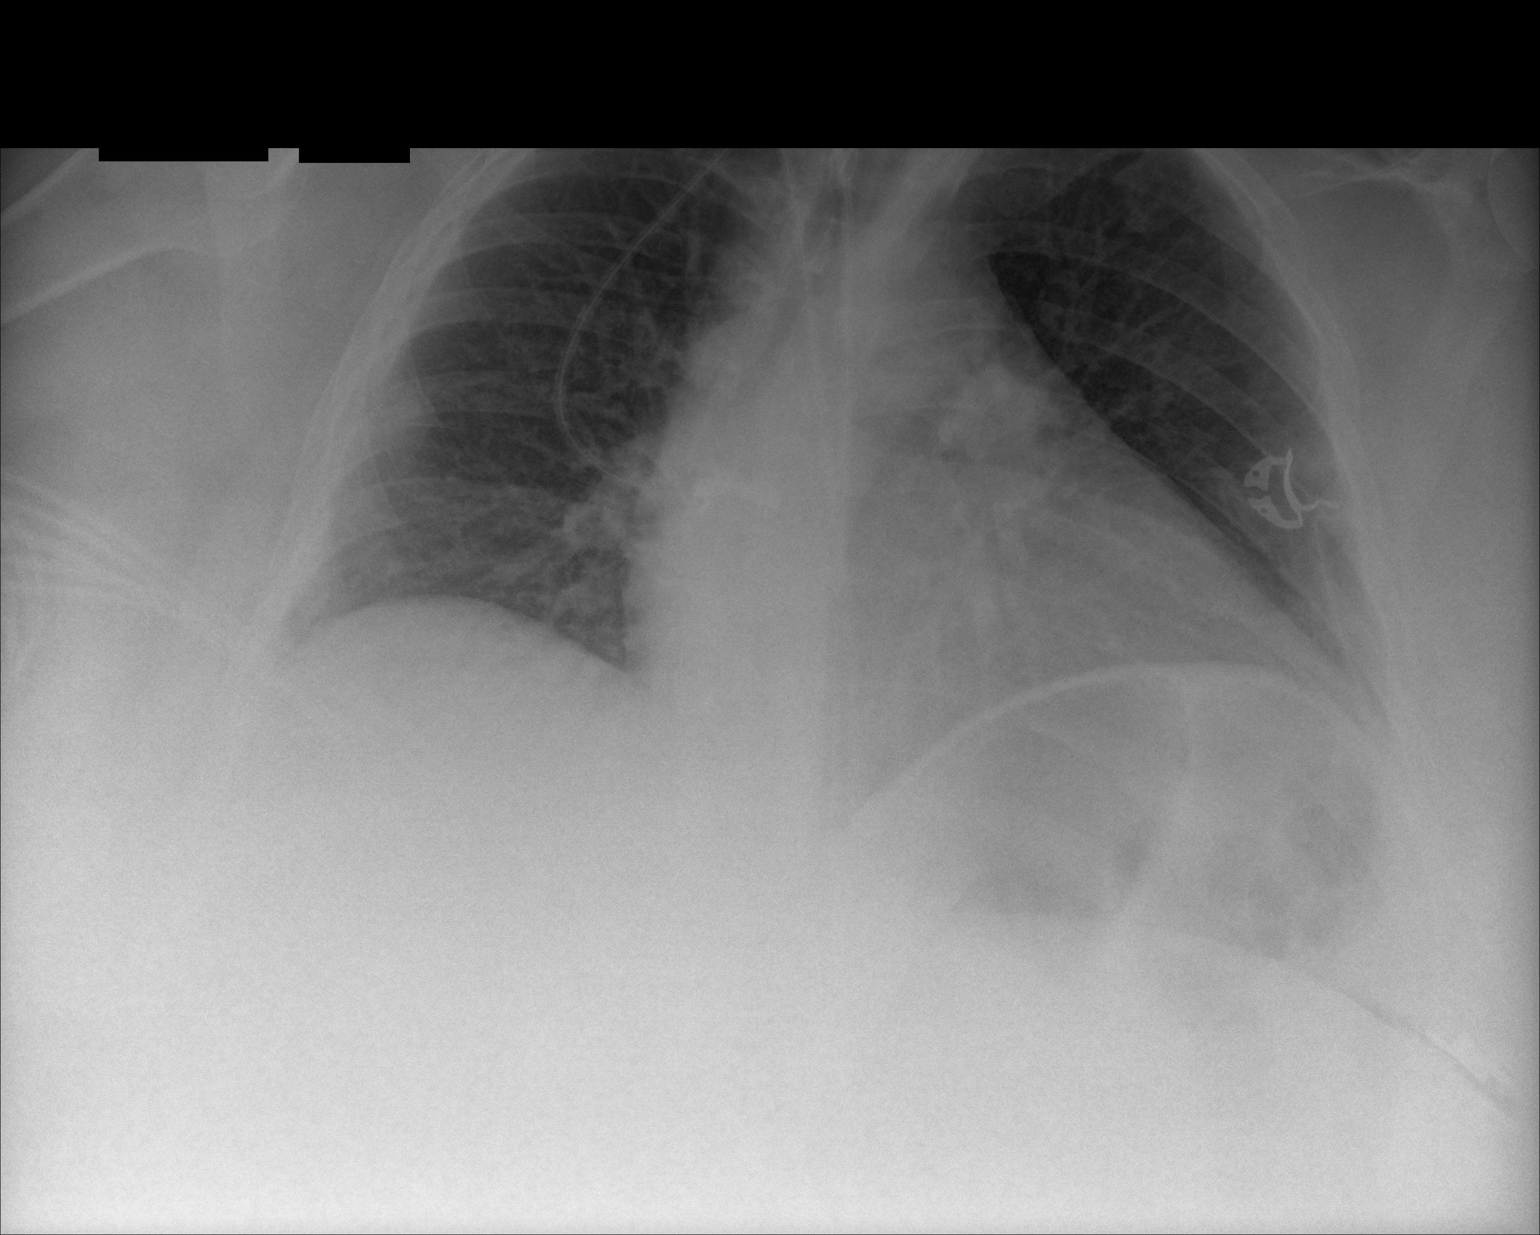

[1 of 1 positions shown; findings below may reference images not displayed]

FINDINGS: Cardiac shadow is mildly enlarged. Poor inspiratory effort is again
noted and stable. No focal infiltrate or sizable effusion is seen.
Old healed rib fractures are noted on the right. No acute
abnormality is noted.
IMPRESSION: No acute abnormality noted.

## 2018-05-28 ENCOUNTER — Institutional Professional Consult (permissible substitution): Payer: Medicare Other | Admitting: Internal Medicine

## 2018-06-14 ENCOUNTER — Institutional Professional Consult (permissible substitution): Payer: Medicare Other | Admitting: Internal Medicine

## 2018-06-25 ENCOUNTER — Encounter (HOSPITAL_BASED_OUTPATIENT_CLINIC_OR_DEPARTMENT_OTHER): Payer: Medicare Other

## 2018-07-02 ENCOUNTER — Institutional Professional Consult (permissible substitution): Payer: Medicare Other | Admitting: Internal Medicine

## 2018-07-11 ENCOUNTER — Institutional Professional Consult (permissible substitution): Payer: Medicare Other | Admitting: Internal Medicine

## 2019-04-17 ENCOUNTER — Ambulatory Visit: Payer: Medicare Other | Attending: Internal Medicine

## 2019-04-17 DIAGNOSIS — Z20822 Contact with and (suspected) exposure to covid-19: Secondary | ICD-10-CM

## 2019-04-18 LAB — SARS-COV-2, NAA 2 DAY TAT

## 2019-04-18 LAB — NOVEL CORONAVIRUS, NAA: SARS-CoV-2, NAA: NOT DETECTED

## 2019-04-19 ENCOUNTER — Telehealth: Payer: Self-pay | Admitting: Internal Medicine

## 2019-04-19 NOTE — Telephone Encounter (Signed)
Pt called to get COVID results, made her aware they are negative. °

## 2019-06-20 ENCOUNTER — Emergency Department (HOSPITAL_BASED_OUTPATIENT_CLINIC_OR_DEPARTMENT_OTHER)
Admission: EM | Admit: 2019-06-20 | Discharge: 2019-06-20 | Disposition: A | Discharge status: Home / Self Care | Payer: No Typology Code available for payment source | Attending: Emergency Medicine | Admitting: Emergency Medicine

## 2019-06-20 ENCOUNTER — Emergency Department (HOSPITAL_BASED_OUTPATIENT_CLINIC_OR_DEPARTMENT_OTHER): Payer: No Typology Code available for payment source

## 2019-06-20 ENCOUNTER — Encounter (HOSPITAL_BASED_OUTPATIENT_CLINIC_OR_DEPARTMENT_OTHER): Payer: Self-pay | Admitting: *Deleted

## 2019-06-20 ENCOUNTER — Other Ambulatory Visit: Payer: Self-pay

## 2019-06-20 DIAGNOSIS — M545 Low back pain, unspecified: Secondary | ICD-10-CM

## 2019-06-20 DIAGNOSIS — Z87891 Personal history of nicotine dependence: Secondary | ICD-10-CM | POA: Diagnosis not present

## 2019-06-20 DIAGNOSIS — E119 Type 2 diabetes mellitus without complications: Secondary | ICD-10-CM | POA: Diagnosis not present

## 2019-06-20 DIAGNOSIS — J449 Chronic obstructive pulmonary disease, unspecified: Secondary | ICD-10-CM | POA: Insufficient documentation

## 2019-06-20 DIAGNOSIS — M542 Cervicalgia: Secondary | ICD-10-CM

## 2019-06-20 DIAGNOSIS — Z91041 Radiographic dye allergy status: Secondary | ICD-10-CM | POA: Insufficient documentation

## 2019-06-20 DIAGNOSIS — Z7984 Long term (current) use of oral hypoglycemic drugs: Secondary | ICD-10-CM | POA: Diagnosis not present

## 2019-06-20 DIAGNOSIS — Z79899 Other long term (current) drug therapy: Secondary | ICD-10-CM | POA: Insufficient documentation

## 2019-06-20 HISTORY — DX: Type 2 diabetes mellitus without complications: E11.9

## 2019-06-20 MED ORDER — CYCLOBENZAPRINE HCL 10 MG PO TABS: 10.0000 mg | ORAL_TABLET | Freq: Two times a day (BID) | ORAL | 0 refills | Status: AC | PRN
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
## 2019-06-20 NOTE — Discharge Instructions (Signed)
Please take Tylenol (acetaminophen) to relieve your pain.  You may take tylenol, up to 1,000 mg (two extra strength pills).  Do not take more than 3,000 mg tylenol in a 24 hour period.  Please check all medication labels as many medications such as pain and cold medications may contain tylenol. Please do not drink alcohol while taking this medication.  ° °You are being prescribed a medication which may make you sleepy. For 24 hours after one dose please do not drive, operate heavy machinery, care for a small child with out another adult present, or perform any activities that may cause harm to you or someone else if you were to fall asleep or be impaired.  ° °
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
## 2019-07-02 ENCOUNTER — Ambulatory Visit: Payer: Medicare Other | Admitting: Medical

## 2019-08-06 ENCOUNTER — Other Ambulatory Visit: Payer: Self-pay

## 2019-08-06 ENCOUNTER — Emergency Department (HOSPITAL_BASED_OUTPATIENT_CLINIC_OR_DEPARTMENT_OTHER)
Admission: EM | Admit: 2019-08-06 | Discharge: 2019-08-06 | Discharge status: Against Medical Advice | Payer: Medicare Other | Attending: Emergency Medicine | Admitting: Emergency Medicine

## 2019-08-06 ENCOUNTER — Emergency Department (HOSPITAL_BASED_OUTPATIENT_CLINIC_OR_DEPARTMENT_OTHER): Payer: Medicare Other

## 2019-08-06 ENCOUNTER — Encounter (HOSPITAL_BASED_OUTPATIENT_CLINIC_OR_DEPARTMENT_OTHER): Payer: Self-pay

## 2019-08-06 DIAGNOSIS — J441 Chronic obstructive pulmonary disease with (acute) exacerbation: Secondary | ICD-10-CM | POA: Insufficient documentation

## 2019-08-06 DIAGNOSIS — R197 Diarrhea, unspecified: Secondary | ICD-10-CM | POA: Insufficient documentation

## 2019-08-06 DIAGNOSIS — Z87891 Personal history of nicotine dependence: Secondary | ICD-10-CM | POA: Diagnosis not present

## 2019-08-06 DIAGNOSIS — E119 Type 2 diabetes mellitus without complications: Secondary | ICD-10-CM | POA: Diagnosis not present

## 2019-08-06 DIAGNOSIS — R112 Nausea with vomiting, unspecified: Secondary | ICD-10-CM | POA: Diagnosis not present

## 2019-08-06 DIAGNOSIS — R509 Fever, unspecified: Secondary | ICD-10-CM | POA: Insufficient documentation

## 2019-08-06 DIAGNOSIS — Z79899 Other long term (current) drug therapy: Secondary | ICD-10-CM | POA: Insufficient documentation

## 2019-08-06 DIAGNOSIS — Z20822 Contact with and (suspected) exposure to covid-19: Secondary | ICD-10-CM | POA: Insufficient documentation

## 2019-08-06 DIAGNOSIS — J45909 Unspecified asthma, uncomplicated: Secondary | ICD-10-CM | POA: Diagnosis not present

## 2019-08-06 DIAGNOSIS — R0602 Shortness of breath: Secondary | ICD-10-CM | POA: Diagnosis present

## 2019-08-06 LAB — CBC WITH DIFFERENTIAL/PLATELET
Abs Immature Granulocytes: 0.09 10*3/uL — ABNORMAL HIGH (ref 0.00–0.07)
Basophils Absolute: 0 10*3/uL (ref 0.0–0.1)
Basophils Relative: 0 %
Eosinophils Absolute: 0.2 10*3/uL (ref 0.0–0.5)
Eosinophils Relative: 2 %
HCT: 44.1 % (ref 36.0–46.0)
Hemoglobin: 14.1 g/dL (ref 12.0–15.0)
Immature Granulocytes: 1 %
Lymphocytes Relative: 29 %
Lymphs Abs: 3.5 10*3/uL (ref 0.7–4.0)
MCH: 27.5 pg (ref 26.0–34.0)
MCHC: 32 g/dL (ref 30.0–36.0)
MCV: 86 fL (ref 80.0–100.0)
Monocytes Absolute: 0.9 10*3/uL (ref 0.1–1.0)
Monocytes Relative: 8 %
Neutro Abs: 7.3 10*3/uL (ref 1.7–7.7)
Neutrophils Relative %: 60 %
Platelets: 405 10*3/uL — ABNORMAL HIGH (ref 150–400)
RBC: 5.13 MIL/uL — ABNORMAL HIGH (ref 3.87–5.11)
RDW: 16.1 % — ABNORMAL HIGH (ref 11.5–15.5)
WBC: 12 10*3/uL — ABNORMAL HIGH (ref 4.0–10.5)
nRBC: 0 % (ref 0.0–0.2)

## 2019-08-06 LAB — COMPREHENSIVE METABOLIC PANEL
ALT: 24 U/L (ref 0–44)
AST: 17 U/L (ref 15–41)
Albumin: 3.7 g/dL (ref 3.5–5.0)
Alkaline Phosphatase: 90 U/L (ref 38–126)
Anion gap: 13 (ref 5–15)
BUN: 8 mg/dL (ref 6–20)
CO2: 22 mmol/L (ref 22–32)
Calcium: 8.8 mg/dL — ABNORMAL LOW (ref 8.9–10.3)
Chloride: 102 mmol/L (ref 98–111)
Creatinine, Ser: 0.62 mg/dL (ref 0.44–1.00)
GFR calc Af Amer: >60 mL/min (ref >60)
GFR calc non Af Amer: >60 mL/min (ref >60)
Glucose, Bld: 144 mg/dL — ABNORMAL HIGH (ref 70–99)
Potassium: 3.9 mmol/L (ref 3.5–5.1)
Sodium: 137 mmol/L (ref 135–145)
Total Bilirubin: 0.7 mg/dL (ref 0.3–1.2)
Total Protein: 7.4 g/dL (ref 6.5–8.1)

## 2019-08-06 LAB — CBG MONITORING, ED: Glucose-Capillary: 144 mg/dL — ABNORMAL HIGH (ref 70–99)

## 2019-08-06 LAB — TROPONIN I (HIGH SENSITIVITY): Troponin I (High Sensitivity): 3 ng/L (ref <18)

## 2019-08-06 LAB — D-DIMER, QUANTITATIVE: D-Dimer, Quant: 0.55 ug/mL-FEU — ABNORMAL HIGH (ref 0.00–0.50)

## 2019-08-06 LAB — SARS CORONAVIRUS 2 BY RT PCR (HOSPITAL ORDER, PERFORMED IN ~~LOC~~ HOSPITAL LAB): SARS Coronavirus 2: NEGATIVE

## 2019-08-06 MED ORDER — MAGNESIUM SULFATE 2 GM/50ML IV SOLN
INTRAVENOUS | Status: AC
  Administered 2019-08-06: 2 g via INTRAVENOUS
  Filled 2019-08-06: qty 50

## 2019-08-06 MED ORDER — PROMETHAZINE-DM 6.25-15 MG/5ML PO SYRP
5.0000 mL | ORAL_SOLUTION | Freq: Four times a day (QID) | ORAL | 0 refills | Status: DC | PRN
Start: 2019-08-06 — End: 2020-02-18

## 2019-08-06 MED ORDER — MAGNESIUM SULFATE 2 GM/50ML IV SOLN: 2.0000 g | Freq: Once | INTRAVENOUS | Status: AC

## 2019-08-06 MED ORDER — ALBUTEROL SULFATE (2.5 MG/3ML) 0.083% IN NEBU
INHALATION_SOLUTION | RESPIRATORY_TRACT | Status: AC
  Filled 2019-08-06: qty 12

## 2019-08-06 MED ORDER — ONDANSETRON HCL 4 MG/2ML IJ SOLN
4.0000 mg | Freq: Once | INTRAMUSCULAR | Status: AC
  Administered 2019-08-06: 4 mg via INTRAVENOUS
  Filled 2019-08-06: qty 2

## 2019-08-06 MED ORDER — METHYLPREDNISOLONE SODIUM SUCC 125 MG IJ SOLR
125.0000 mg | Freq: Once | INTRAMUSCULAR | Status: AC
  Administered 2019-08-06: 125 mg via INTRAVENOUS
  Filled 2019-08-06: qty 2

## 2019-08-06 MED ORDER — ALBUTEROL SULFATE (2.5 MG/3ML) 0.083% IN NEBU
10.0000 mg | INHALATION_SOLUTION | Freq: Once | RESPIRATORY_TRACT | Status: AC
  Administered 2019-08-06: 10 mg via RESPIRATORY_TRACT
  Filled 2019-08-06: qty 12

## 2019-08-06 MED ORDER — ALBUTEROL SULFATE (2.5 MG/3ML) 0.083% IN NEBU
2.5000 mg | INHALATION_SOLUTION | Freq: Four times a day (QID) | RESPIRATORY_TRACT | 0 refills | Status: AC | PRN
Start: 2019-08-06 — End: ?

## 2019-08-06 MED ORDER — MAGNESIUM SULFATE 50 % IJ SOLN: 2.0000 g | Freq: Once | INTRAMUSCULAR | Status: DC

## 2019-08-06 MED ORDER — PREDNISONE 20 MG PO TABS
40.0000 mg | ORAL_TABLET | Freq: Every day | ORAL | 0 refills | Status: AC
Start: 2019-08-07 — End: 2019-08-11

## 2019-08-06 MED ORDER — ALBUTEROL SULFATE HFA 108 (90 BASE) MCG/ACT IN AERS
8.0000 | INHALATION_SPRAY | Freq: Once | RESPIRATORY_TRACT | Status: AC
  Administered 2019-08-06: 8 via RESPIRATORY_TRACT
  Filled 2019-08-06: qty 6.7

## 2019-08-06 MED ORDER — IPRATROPIUM BROMIDE HFA 17 MCG/ACT IN AERS
2.0000 | INHALATION_SPRAY | Freq: Once | RESPIRATORY_TRACT | Status: AC
  Administered 2019-08-06: 2 via RESPIRATORY_TRACT
  Filled 2019-08-06: qty 12.9

## 2019-08-06 MED ORDER — AZITHROMYCIN 250 MG PO TABS: 250.0000 mg | ORAL_TABLET | Freq: Every day | ORAL | 0 refills | Status: DC

## 2019-08-06 NOTE — ED Triage Notes (Signed)
Pt reports cough and SOB X 2 days pt reports she was having a productive cough with brown mucus, states that nothing is coming up now.

## 2019-08-06 NOTE — ED Provider Notes (Signed)
[facility] EMERGENCY DEPARTMENT Provider Note   CSN: [id number] Arrival date & time: 08/06/19  1353     History Chief Complaint  Patient presents with   Shortness of Breath    [name] is a 39 y.o. female presented for evaluation of shortness of breath.  Patient states the past 2 days, she has had increased shortness of breath.  She states she is having associated chest tightness, consistent with her COPD/asthma exacerbations.  She has been using her albuterol without improvement of symptoms.  She is out of her nebulizer, ran out about a week ago.  She reports subjective fever.  She has an associated cough, states it was productive of brown mucus.  She has associated nausea, vomiting, diarrhea.  She denies chest pain, abdominal pain, urinary symptoms.  She denies sick contacts.  She denies contact with known COVID-19 positive person.  She has not received her Covid vaccines. He has a h/o DM, does not check her BGL regularly. Does not know if her bgl has been elevated.   Additional history obtained in chart review.  Patient with a history of anxiety, asthma, bipolar, COPD, depression, diabetes, obesity.  She was admitted to the hospital last year for respiratory failure with hypoxia.  HPI     Past Medical History:  Diagnosis Date   Anxiety    Arthritis    Asthma    Bipolar 1 disorder (HCC)    COPD (chronic obstructive pulmonary disease) (HCC)    Depression    Diabetes mellitus without complication (HCC)    Emphysema    Obesity    Pneumonia    Rib fracture     Patient Active Problem List   Diagnosis Date Noted   Suspected COVID-19 virus infection 05/02/2018   Bipolar 1 disorder (HCC) 05/02/2018   OSA (obstructive sleep apnea) 05/01/2018   Hypokalemia 05/01/2018   Lactic acidosis 05/01/2018   Acute on chronic respiratory failure with hypoxemia (HCC) 05/01/2018   Sepsis (HCC) 05/01/2018   COPD (chronic obstructive pulmonary  disease) (HCC) 07/04/2016   Acute respiratory failure with hypoxia (HCC) 07/04/2016    Past Surgical History:  Procedure Laterality Date   CHOLECYSTECTOMY     TUBAL LIGATION       OB History    Gravida  6   Para  3   Term  3   Preterm  0   AB  3   Living  3     SAB  3   TAB  0   Ectopic  0   Multiple  0   Live Births              No family history on file.  Social History   Tobacco Use   Smoking status: Former Smoker    Packs/day: 0.00    Quit date: 05/07/2011    Years since quitting: 8.2   Smokeless tobacco: Never Used  Vaping Use   Vaping Use: Former  Substance Use Topics   Alcohol use: No   Drug use: No    Home Medications Prior to Admission medications   Medication Sig Start Date End Date Taking? Authorizing Provider  albuterol (PROVENTIL HFA;VENTOLIN HFA) 108 (90 Base) MCG/ACT inhaler Inhale 2 puffs into the lungs every 6 (six) hours as needed. For shortness of breath. 07/05/16   Kathlen ModyAkula, Vijaya, MD  albuterol (PROVENTIL) (2.5 MG/3ML) 0.083% nebulizer solution Take 3 mLs (2.5 mg total) by nebulization every 6 (six) hours as needed for wheezing or shortness  of breath. 08/06/19   Rochanda Harpham, PA-C  azithromycin (ZITHROMAX) 250 MG tablet Take 1 tablet (250 mg total) by mouth daily. Take first 2 tablets together, then 1 every day until finished. 08/06/19   Rhyland Hinderliter, PA-C  beclomethasone (QVAR) 80 MCG/ACT inhaler Inhale 2 puffs into the lungs 2 (two) times daily.    [provider]  clonazePAM (KLONOPIN) 0.5 MG tablet Take 2 tablets (1 mg total) by mouth 2 (two) times daily as needed for anxiety (take 1mg  every am and .5mg  every pm ). 01/29/11 05/01/18  03/29/11, NP  cyclobenzaprine (FLEXERIL) 10 MG tablet Take 1 tablet (10 mg total) by mouth 2 (two) times daily as needed for muscle spasms. 06/20/19   Jethro Bastos, PA-C  escitalopram (LEXAPRO) 20 MG tablet Take 20 mg by mouth daily.      [provider]   gabapentin (NEURONTIN) 300 MG capsule Take 300 mg by mouth 2 (two) times daily.     [provider]  guaiFENesin (MUCINEX) 600 MG 12 hr tablet Take 2 tablets (1,200 mg total) by mouth 2 (two) times daily. 05/03/18   Sheikh, Omair Latif, DO  HYDROcodone-homatropine [facility]) 5-1.5 MG/5ML syrup Take 5 mLs by mouth every 6 (six) hours as needed for cough. Patient not taking: Reported on 01/22/2015 09/01/14   01/24/2015, PA-C  ipratropium-albuterol (DUONEB) 0.5-2.5 (3) MG/3ML SOLN Take 3 mLs by nebulization every 4 (four) hours as needed. Patient taking differently: Take 3 mLs by nebulization every 4 (four) hours as needed (shortness of breath).  07/05/16   Mady Gemma, MD  lurasidone (LATUDA) 80 MG TABS tablet Take 80 mg by mouth daily with breakfast.    [provider]  mupirocin ointment (BACTROBAN) 2 % Place 1 application into the nose 2 (two) times daily. 05/03/18   Kathlen Mody Latif, DO  omeprazole (PRILOSEC) 40 MG capsule Take 40 mg by mouth daily. 04/04/18   [provider]  predniSONE (DELTASONE) 20 MG tablet Take 2 tablets (40 mg total) by mouth daily for 4 days. 08/07/19 08/11/19  Hannie Shoe, PA-C  promethazine-dextromethorphan (PROMETHAZINE-DM) 6.25-15 MG/5ML syrup Take 5 mLs by mouth 4 (four) times daily as needed for cough. 08/06/19   Leather Estis, PA-C  traZODone (DESYREL) 50 MG tablet Take 100 mg by mouth daily.  03/30/18   [provider]  zinc sulfate 220 (50 Zn) MG capsule Take 1 capsule (220 mg total) by mouth 2 (two) times daily. 05/04/18   05/30/18 Latif, DO    Allergies    Iohexol  Review of Systems   Review of Systems  Constitutional: Positive for fever.  Respiratory: Positive for cough, chest tightness, shortness of breath and wheezing.   Gastrointestinal: Positive for diarrhea, nausea and vomiting.  All other systems reviewed and are negative.   Physical Exam Updated Vital Signs BP 121/88    Pulse (!) 109     Temp 99 F (37.2 C) (Oral)    Resp (!) 22    Ht 5\' 2"  (1.575 m)    Wt (!) 158.1 kg    SpO2 98%    BMI 63.76 kg/m   Physical Exam Vitals and nursing note reviewed.  Constitutional:      Appearance: She is well-developed. She is obese. She is ill-appearing.     Comments: Obese female with increased work of breathing who appears ill  HENT:     Head: Normocephalic and atraumatic.  Eyes:     Conjunctiva/sclera: Conjunctivae normal.  Pupils: Pupils are equal, round, and reactive to light.  Cardiovascular:     Rate and Rhythm: Regular rhythm. Tachycardia present.     Pulses: Normal pulses.  Pulmonary:     Effort: Tachypnea and accessory muscle usage present. No respiratory distress.     Breath sounds: Decreased breath sounds and wheezing present.     Comments: Exam limited due to obesity.  Minimal air movement, with scattered expiratory wheezing.  Patient is tachypneic (around 35) with increased work of breathing.  SPO2 stable on room air in the upper 90s. Abdominal:     General: There is no distension.     Palpations: Abdomen is soft.     Tenderness: There is no abdominal tenderness. There is no guarding or rebound.  Musculoskeletal:        General: Normal range of motion.     Cervical back: Normal range of motion and neck supple.     Right lower leg: No edema.     Left lower leg: No edema.  Skin:    General: Skin is warm and dry.     Capillary Refill: Capillary refill takes less than 2 seconds.  Neurological:     Mental Status: She is alert and oriented to person, place, and time.     ED Results / Procedures / Treatments   Labs (all labs ordered are listed, but only abnormal results are displayed) Labs Reviewed  CBC WITH DIFFERENTIAL/PLATELET - Abnormal; Notable for the following components:      Result Value   WBC 12.0 (*)    RBC 5.13 (*)    RDW 16.1 (*)    Platelets 405 (*)    Abs Immature Granulocytes 0.09 (*)    All other components within normal limits   COMPREHENSIVE METABOLIC PANEL - Abnormal; Notable for the following components:   Glucose, Bld 144 (*)    Calcium 8.8 (*)    All other components within normal limits  D-DIMER, QUANTITATIVE (NOT AT [facility] - Abnormal; Notable for the following components:   D-Dimer, Quant 0.55 (*)    All other components within normal limits  CBG MONITORING, ED - Abnormal; Notable for the following components:   Glucose-Capillary 144 (*)    All other components within normal limits  SARS CORONAVIRUS 2 BY RT PCR (HOSPITAL ORDER, PERFORMED IN [facility])  URINALYSIS, ROUTINE W REFLEX MICROSCOPIC  PREGNANCY, URINE  TROPONIN I (HIGH SENSITIVITY)  TROPONIN I (HIGH SENSITIVITY)    EKG None  Radiology DG Chest Portable 1 View  Result Date: 08/06/2019 CLINICAL DATA:  05/02/2018 EXAM: PORTABLE CHEST 1 VIEW COMPARISON:  None. FINDINGS: The heart size and mediastinal contours are within normal limits. Both lungs are clear. The visualized skeletal structures are unremarkable. IMPRESSION: No active disease. Electronically Signed   By: Helyn Numbers MD   On: 08/06/2019 15:35    Procedures .Critical Care Performed by: Alveria Apley, PA-C Authorized by: Alveria Apley, PA-C   Critical care provider statement:    Critical care time (minutes):  50   Critical care time was exclusive of:  Separately billable procedures and treating other patients and teaching time   Critical care was necessary to treat or prevent imminent or life-threatening deterioration of the following conditions:  Respiratory failure   Critical care was time spent personally by me on the following activities:  Blood draw for specimens, development of treatment plan with patient or surrogate, evaluation of patient's response to treatment, examination of patient, obtaining history from patient or  surrogate, ordering and performing treatments and interventions, ordering and review of laboratory studies, ordering and review of  radiographic studies, pulse oximetry, re-evaluation of patient's condition and review of old charts   I assumed direction of critical care for this patient from another provider in my specialty: no   Comments:     Pt presenting with respiratory distress. required hr-long neb. Recommended for hospitalization, pt declined and left AMA   (including critical care time)  Medications Ordered in ED Medications  albuterol (VENTOLIN HFA) 108 (90 Base) MCG/ACT inhaler 8 puff (8 puffs Inhalation Given 08/06/19 1450)  ipratropium (ATROVENT HFA) inhaler 2 puff (2 puffs Inhalation Given 08/06/19 1450)  methylPREDNISolone sodium succinate (SOLU-MEDROL) 125 mg/2 mL injection 125 mg (125 mg Intravenous Given 08/06/19 1458)  magnesium sulfate IVPB 2 g 50 mL ( Intravenous Stopped 08/06/19 1549)  ondansetron (ZOFRAN) injection 4 mg (4 mg Intravenous Given 08/06/19 1551)  albuterol (PROVENTIL) (2.5 MG/3ML) 0.083% nebulizer solution 10 mg (10 mg Nebulization Given 08/06/19 1615)    ED Course  I have reviewed the triage vital signs and the nursing notes.  Pertinent labs & imaging results that were available during my care of the patient were reviewed by me and considered in my medical decision making (see chart for details).    MDM Rules/Calculators/A&P                          Patient presented for evaluation of shortness of breath.  On exam, patient appears ill.  She has increased work of breathing.  However sats are stable on room air.  She has limited air movement on my exam.  Concern for COPD exacerbation.  Concern for viral illness including Covid.  Concern for possible pneumonia.  Consider DKA, but less likely in the setting of systemic symptoms.  Will give albuterol ipratropium with the inhaler well Covid test is pending.  Unable to admit until Covid has resulted.  Will obtain portable chest x-ray and labs.  Treated with Solu-Medrol and mag.  On reassessment after medications, patient reports mild improvement  of shortness of breath.  She is still very tachypneic with increased work of breathing.  She has slightly improved air movement on my exam, but still wheezing.  Labs show mild nonspecific leukocytosis of 12.  Otherwise reassuring.  Troponin is negative.  D-dimer mildly elevated at 0.55.  Has been much more elevated in the past at 3.4.  Unfortunately, patient has a contrast allergy.  Discussed findings with patient.  Discussed option of 4-hour premedication and CT here in the ED, or transfer for a VQ scan.  Patient declined both.  As such, I suggested she follow-up closely with her PCP to see if she can get an outpatient study.  Based on her presentation, I still favor COPD exacerbation over PE. Covid test negative, as such, will give hour-long neb.  On reassessment after hour-long neb, patient reports she is feeling slightly better.  She was ambulated in the room, no desaturation, however patient did have tachycardia, increased work of breathing, and upon sitting on the bed needed to tri-pod.  Discussed with patient my concern about her ability to go home safely.  Recommended hospitalization.  Patient declined.  I once again expressed my concern about her safety going home.  She states she understands. She is of sound mind and able to make medical decisions.  He states she will either return to this ER or go to [facility] if symptoms  worsen.  She states she will follow closely with her PCP.  For d/c, will refill pt's ned albuterol, give cough syrup, prednisone, and abx.   Final Clinical Impression(s) / ED Diagnoses Final diagnoses:  COPD exacerbation (HCC)    Rx / DC Orders ED Discharge Orders         Ordered    predniSONE (DELTASONE) 20 MG tablet  Daily     Discontinue  Reprint     08/06/19 1743    albuterol (PROVENTIL) (2.5 MG/3ML) 0.083% nebulizer solution  Every 6 hours PRN     Discontinue  Reprint     08/06/19 1743    promethazine-dextromethorphan (PROMETHAZINE-DM) 6.25-15 MG/5ML syrup  4  times daily PRN     Discontinue  Reprint     08/06/19 1743    azithromycin (ZITHROMAX) 250 MG tablet  Daily     Discontinue  Reprint     08/06/19 1743           Alveria Apley, PA-C 08/06/19 1811    Tegeler, Canary Brim, MD 08/07/19 [phone]

## 2019-08-06 NOTE — ED Notes (Signed)
Patient ambulated in room.  SpO2 92-95%, Hr 115-120, RR 30-35, accessory muscle use,+ DOE.  VS updated after.

## 2019-08-06 NOTE — ED Notes (Signed)
Pt verbalized understanding that she is leaving AMA and understands the risks of refusal as was presented to her by the PCP.

## 2019-08-06 NOTE — Discharge Instructions (Signed)
You are leaving AGAINST MEDICAL ADVICE.  You may have a condition that could result in death.  Take the prednisone as prescribed. Take the Z-Pak as prescribed.  Take the entire course, even if your symptoms improve. It is very important that you are using your inhalers and nebulizers as prescribed. Call your primary care doctor and tell them that you had worsening breathing today.  Your D-dimer was minimally elevated, and you will need imaging for this. Go to the hospital immediately if you have worsening breathing, chest pain, or any new, worsening, or concerning symptoms.

## 2019-08-06 NOTE — ED Triage Notes (Signed)
Pt arrives Ambulatory to ED with c/o SOB. Pt refusing to use a mask and cursing at staff to give a breathing treatment. Crystal RT at bedside evaluating pt (see note). This RN explained pt policy. Pt refusing to answer questions, repeating she is SOB.

## 2019-08-29 ENCOUNTER — Ambulatory Visit: Payer: Medicare Other

## 2019-09-11 ENCOUNTER — Encounter: Payer: Self-pay | Admitting: *Deleted

## 2019-09-11 ENCOUNTER — Other Ambulatory Visit: Payer: Self-pay

## 2019-09-11 ENCOUNTER — Ambulatory Visit (INDEPENDENT_AMBULATORY_CARE_PROVIDER_SITE_OTHER): Payer: Medicare Other | Admitting: *Deleted

## 2019-09-11 ENCOUNTER — Other Ambulatory Visit (HOSPITAL_COMMUNITY)
Admission: RE | Admit: 2019-09-11 | Discharge: 2019-09-11 | Disposition: A | Discharge status: Home / Self Care | Payer: Medicare Other | Source: Ambulatory Visit | Attending: Family Medicine | Admitting: Family Medicine

## 2019-09-11 VITALS — BP 121/80 | HR 108 | Ht 67.0 in | Wt 348.0 lb

## 2019-09-11 DIAGNOSIS — N898 Other specified noninflammatory disorders of vagina: Secondary | ICD-10-CM | POA: Insufficient documentation

## 2019-09-11 NOTE — Progress Notes (Signed)
Pt reports vaginal itching and irritation x1 week. She had sex with a new partner 2 weeks ago and the condom broke. Self swab obtained. Pt will be notified of results and treatment if indicated via Mychart. She voiced understanding and agreed to plan of care.

## 2019-09-12 ENCOUNTER — Telehealth: Payer: Self-pay

## 2019-09-12 LAB — CERVICOVAGINAL ANCILLARY ONLY
Bacterial Vaginitis (gardnerella): POSITIVE — AB
Candida Glabrata: POSITIVE — AB
Candida Vaginitis: POSITIVE — AB
Chlamydia: NEGATIVE
Comment: NEGATIVE
Comment: NEGATIVE
Comment: NEGATIVE
Comment: NEGATIVE
Comment: NEGATIVE
Comment: NORMAL
Neisseria Gonorrhea: NEGATIVE
Trichomonas: POSITIVE — AB

## 2019-09-12 MED ORDER — METRONIDAZOLE 500 MG PO TABS: 500.0000 mg | ORAL_TABLET | Freq: Two times a day (BID) | ORAL | 0 refills | Status: AC

## 2019-09-12 MED ORDER — FLUCONAZOLE 150 MG PO TABS: 150.0000 mg | ORAL_TABLET | Freq: Every day | ORAL | 2 refills | Status: DC

## 2019-09-12 NOTE — Telephone Encounter (Signed)
Called Pt to advise wet prep has not resulted at this time, generally takes 24 to 48 hrs. Advise because she has My Chart, that she will get results before we do actually.Pt verbalized understanding.

## 2019-09-12 NOTE — Telephone Encounter (Signed)
Pt. Called Nurse Line on 09/12/19 @ 11:38am wanting to know test results from wet prep done on 09/11/19.

## 2019-09-12 NOTE — Progress Notes (Signed)
Patient seen and assessed by nursing staff.  Agree with documentation and plan.  

## 2019-09-12 NOTE — Addendum Note (Signed)
Addended by: Reva Bores on: 09/12/2019 05:20 PM   Modules accepted: Orders

## 2019-09-13 ENCOUNTER — Telehealth: Payer: Self-pay | Admitting: *Deleted

## 2019-09-13 DIAGNOSIS — N898 Other specified noninflammatory disorders of vagina: Secondary | ICD-10-CM

## 2019-09-13 MED ORDER — FLUCONAZOLE 150 MG PO TABS
ORAL_TABLET | ORAL | 2 refills | Status: AC
Start: 2019-09-13 — End: ?

## 2019-09-13 NOTE — Telephone Encounter (Signed)
Pt left VM message stating that a prescription was called in to [facility] and the directions were not clear. I reviewed pt's chart and updated Rx instructions for diflucan following discussion with Dr. Shawnie Pons. New rx e-prescribed. Pt was notified and voiced understanding.

## 2019-09-23 ENCOUNTER — Other Ambulatory Visit (HOSPITAL_BASED_OUTPATIENT_CLINIC_OR_DEPARTMENT_OTHER): Payer: Self-pay

## 2019-09-23 DIAGNOSIS — R0683 Snoring: Secondary | ICD-10-CM

## 2019-10-03 ENCOUNTER — Other Ambulatory Visit: Payer: Self-pay

## 2019-10-03 ENCOUNTER — Ambulatory Visit (HOSPITAL_BASED_OUTPATIENT_CLINIC_OR_DEPARTMENT_OTHER): Payer: Medicare Other | Attending: Internal Medicine | Admitting: Internal Medicine

## 2019-10-03 VITALS — Temp 97.0°F | Ht 66.0 in | Wt 345.0 lb

## 2019-10-03 DIAGNOSIS — R0683 Snoring: Secondary | ICD-10-CM | POA: Diagnosis present

## 2019-10-03 DIAGNOSIS — G4733 Obstructive sleep apnea (adult) (pediatric): Secondary | ICD-10-CM | POA: Diagnosis not present

## 2019-10-13 DIAGNOSIS — R0683 Snoring: Secondary | ICD-10-CM

## 2019-10-13 NOTE — Procedures (Signed)
Patient Name: [name], [name] Date: 10/03/2019 Gender: Female D.O.B: 05-25-1980 Age (years): 39 Referring Provider: Elwyn Reach Height (inches): 37 Interpreting Physician: Baird Lyons MD, ABSM Weight (lbs): 345 RPSGT: Baxter Flattery BMI: 45 MRN: [id number] Neck Size: 18.00  CLINICAL INFORMATION Sleep Study Type: Split Night CPAP Indication for sleep study: COPD, Fatigue, Obesity, Snoring, Witnessed Apneas Epworth Sleepiness Score: 6  SLEEP STUDY TECHNIQUE As per the AASM Manual for the Scoring of Sleep and Associated Events v2.3 (April 2016) with a hypopnea requiring 4% desaturations.  The channels recorded and monitored were frontal, central and occipital EEG, electrooculogram (EOG), submentalis EMG (chin), nasal and oral airflow, thoracic and abdominal wall motion, anterior tibialis EMG, snore microphone, electrocardiogram, and pulse oximetry. Continuous positive airway pressure (CPAP) was initiated when the patient met split night criteria and was titrated according to treat sleep-disordered breathing.  MEDICATIONS Medications self-administered by patient taken the night of the study : none reported  RESPIRATORY PARAMETERS Diagnostic  Total AHI (/hr): 108.9 RDI (/hr): 114.6 OA Index (/hr): 8.2 CA Index (/hr): 0.0 REM AHI (/hr): N/A NREM AHI (/hr): 108.9 Supine AHI (/hr): N/A Non-supine AHI (/hr): 109 Min O2 Sat (%): 57.0 Mean O2 (%): 82.9 Time below 88% (min): 87.8   Titration  Optimal Pressure (cm): 15 AHI at Optimal Pressure (/hr): 0.0 Min O2 at Optimal Pressure (%): 89.0 Supine % at Optimal (%): 0 Sleep % at Optimal (%): 96   SLEEP ARCHITECTURE The recording time for the entire night was 395.9 minutes.  During a baseline period of 154.9 minutes, the patient slept for 116.8 minutes in REM and nonREM, yielding a sleep efficiency of 75.4%%. Sleep onset after lights out was 10.7 minutes with a REM latency of N/A minutes. The patient spent 2.1%% of the  night in stage N1 sleep, 97.9%% in stage N2 sleep, 0.0%% in stage N3 and 0% in REM.  During the titration period of 237.8 minutes, the patient slept for 221.5 minutes in REM and nonREM, yielding a sleep efficiency of 93.2%%. Sleep onset after CPAP initiation was 6.1 minutes with a REM latency of 32.0 minutes. The patient spent 2.0%% of the night in stage N1 sleep, 8.6%% in stage N2 sleep, 0.0%% in stage N3 and 89.4% in REM.  CARDIAC DATA The 2 lead EKG demonstrated sinus rhythm. The mean heart rate was 100.0 beats per minute. Other EKG findings include: PVCs.  LEG MOVEMENT DATA The total Periodic Limb Movements of Sleep (PLMS) were 0. The PLMS index was 0.0 .  IMPRESSIONS - Severe obstructive sleep apnea occurred during the diagnostic portion of the study (AHI = 108.9/hour). An optimal PAP pressure was selected for this patient ( 15 cm of water) - No significant central sleep apnea occurred during the diagnostic portion of the study (CAI = 0.0/hour). - Oxygen desaturation was noted during the diagnostic portion of the study (Min O2 = 57.0%). - Snoring was audible during the diagnostic portion of the study. - EKG findings include PVCs. - Clinically significant periodic limb movements did not occur during sleep.  DIAGNOSIS - Obstructive Sleep Apnea (G47.33)  RECOMMENDATIONS - Trial of CPAP therapy on 15 cm H2O or autopap 10-20. - Patient used a Medium size Fisher[employer]Paykel Full Face Mask Simplus mask and heated humidification. - Be careful with alcohol, sedatives and other CNS depressants that may worsen sleep apnea and disrupt normal sleep architecture. - Sleep hygiene should be reviewed to assess factors that may improve sleep quality. - Weight management and regular exercise  should be initiated or continued.  [Electronically signed] 10/13/2019 11:59 AM  Baird Lyons MD, ABSM Diplomate, American Board of Sleep Medicine   NPI: [id number]                          [place], [place] [facility]  ELECTRONICALLY SIGNED ON:  10/13/2019, 11:50 AM [place] PH: [phone]   FX: [phone] [place]

## 2019-10-21 ENCOUNTER — Ambulatory Visit: Payer: Medicare Other | Admitting: Student

## 2019-12-17 ENCOUNTER — Ambulatory Visit: Payer: Medicare Other | Admitting: Certified Nurse Midwife

## 2020-02-08 ENCOUNTER — Emergency Department (HOSPITAL_BASED_OUTPATIENT_CLINIC_OR_DEPARTMENT_OTHER): Payer: Medicare Other

## 2020-02-08 ENCOUNTER — Emergency Department (HOSPITAL_BASED_OUTPATIENT_CLINIC_OR_DEPARTMENT_OTHER)
Admission: EM | Admit: 2020-02-08 | Discharge: 2020-02-08 | Disposition: A | Discharge status: Home / Self Care | Payer: Medicare Other | Attending: Emergency Medicine | Admitting: Emergency Medicine

## 2020-02-08 ENCOUNTER — Encounter (HOSPITAL_BASED_OUTPATIENT_CLINIC_OR_DEPARTMENT_OTHER): Payer: Self-pay | Admitting: Emergency Medicine

## 2020-02-08 DIAGNOSIS — R059 Cough, unspecified: Secondary | ICD-10-CM | POA: Diagnosis present

## 2020-02-08 DIAGNOSIS — I517 Cardiomegaly: Secondary | ICD-10-CM | POA: Insufficient documentation

## 2020-02-08 DIAGNOSIS — Z7951 Long term (current) use of inhaled steroids: Secondary | ICD-10-CM | POA: Insufficient documentation

## 2020-02-08 DIAGNOSIS — Z20822 Contact with and (suspected) exposure to covid-19: Secondary | ICD-10-CM | POA: Insufficient documentation

## 2020-02-08 DIAGNOSIS — J069 Acute upper respiratory infection, unspecified: Secondary | ICD-10-CM | POA: Diagnosis not present

## 2020-02-08 DIAGNOSIS — Z87891 Personal history of nicotine dependence: Secondary | ICD-10-CM | POA: Diagnosis not present

## 2020-02-08 DIAGNOSIS — E119 Type 2 diabetes mellitus without complications: Secondary | ICD-10-CM | POA: Diagnosis not present

## 2020-02-08 DIAGNOSIS — R0989 Other specified symptoms and signs involving the circulatory and respiratory systems: Secondary | ICD-10-CM

## 2020-02-08 DIAGNOSIS — J449 Chronic obstructive pulmonary disease, unspecified: Secondary | ICD-10-CM | POA: Insufficient documentation

## 2020-02-08 DIAGNOSIS — J45909 Unspecified asthma, uncomplicated: Secondary | ICD-10-CM | POA: Diagnosis not present

## 2020-02-08 DIAGNOSIS — Z79899 Other long term (current) drug therapy: Secondary | ICD-10-CM | POA: Diagnosis not present

## 2020-02-08 LAB — CBC WITH DIFFERENTIAL/PLATELET
Abs Immature Granulocytes: 0.06 10*3/uL (ref 0.00–0.07)
Basophils Absolute: 0 10*3/uL (ref 0.0–0.1)
Basophils Relative: 0 %
Eosinophils Absolute: 0.3 10*3/uL (ref 0.0–0.5)
Eosinophils Relative: 2 %
HCT: 41.4 % (ref 36.0–46.0)
Hemoglobin: 13.8 g/dL (ref 12.0–15.0)
Immature Granulocytes: 0 %
Lymphocytes Relative: 23 %
Lymphs Abs: 3.4 10*3/uL (ref 0.7–4.0)
MCH: 29.4 pg (ref 26.0–34.0)
MCHC: 33.3 g/dL (ref 30.0–36.0)
MCV: 88.1 fL (ref 80.0–100.0)
Monocytes Absolute: 0.8 10*3/uL (ref 0.1–1.0)
Monocytes Relative: 6 %
Neutro Abs: 10 10*3/uL — ABNORMAL HIGH (ref 1.7–7.7)
Neutrophils Relative %: 69 %
Platelets: 380 10*3/uL (ref 150–400)
RBC: 4.7 MIL/uL (ref 3.87–5.11)
RDW: 15.3 % (ref 11.5–15.5)
WBC: 14.6 10*3/uL — ABNORMAL HIGH (ref 4.0–10.5)
nRBC: 0 % (ref 0.0–0.2)

## 2020-02-08 LAB — BASIC METABOLIC PANEL
Anion gap: 9 (ref 5–15)
BUN: 10 mg/dL (ref 6–20)
CO2: 27 mmol/L (ref 22–32)
Calcium: 8.6 mg/dL — ABNORMAL LOW (ref 8.9–10.3)
Chloride: 104 mmol/L (ref 98–111)
Creatinine, Ser: 0.55 mg/dL (ref 0.44–1.00)
GFR, Estimated: >60 mL/min (ref >60)
Glucose, Bld: 132 mg/dL — ABNORMAL HIGH (ref 70–99)
Potassium: 3 mmol/L — ABNORMAL LOW (ref 3.5–5.1)
Sodium: 140 mmol/L (ref 135–145)

## 2020-02-08 LAB — TROPONIN I (HIGH SENSITIVITY): Troponin I (High Sensitivity): 3 ng/L (ref <18)

## 2020-02-08 LAB — SARS CORONAVIRUS 2 (TAT 6-24 HRS): SARS Coronavirus 2: NEGATIVE

## 2020-02-08 MED ORDER — DOXYCYCLINE HYCLATE 100 MG PO CAPS: 100.0000 mg | ORAL_CAPSULE | Freq: Two times a day (BID) | ORAL | 0 refills | Status: AC

## 2020-02-08 MED ORDER — FUROSEMIDE 20 MG PO TABS: 20.0000 mg | ORAL_TABLET | Freq: Every day | ORAL | 0 refills | Status: AC

## 2020-02-08 MED ORDER — POTASSIUM CHLORIDE CRYS ER 20 MEQ PO TBCR
40.0000 meq | EXTENDED_RELEASE_TABLET | Freq: Once | ORAL | Status: AC
  Administered 2020-02-08: 40 meq via ORAL
  Filled 2020-02-08: qty 2

## 2020-02-08 MED ORDER — POTASSIUM CHLORIDE ER 10 MEQ PO TBCR
10.0000 meq | EXTENDED_RELEASE_TABLET | Freq: Every day | ORAL | 0 refills | Status: DC
Start: 2020-02-08 — End: 2020-02-19

## 2020-02-08 MED ORDER — PREDNISONE 20 MG PO TABS: 40.0000 mg | ORAL_TABLET | Freq: Every day | ORAL | 0 refills | Status: AC

## 2020-02-08 NOTE — ED Triage Notes (Signed)
Cough x 3 weeks. She finished an abx without relief. Hx of COPD

## 2020-02-08 NOTE — ED Provider Notes (Signed)
[facility] EMERGENCY DEPARTMENT Provider Note   CSN: [id number] Arrival date & time: 02/08/20  1000    History Chief Complaint  Patient presents with  . Cough    Ermina L Kutner is a 40 y.o. female with past medical history significant for diabetes, COPD, bipolar presents for evaluation of feeling unwell.  Patient states approximately 2 to 3 weeks ago she developed a cough.  Given azithromycin by her PCP.  Patient states since then she continues to have a cough, myalgias, loss of taste and smell and moderate fatigue.  She denies any chest pain.  States she has dyspnea on exertion however this is chronic and at baseline.  She has not been on any recent antibiotics.  She is unsure what her blood sugars have been running at home.  States at that time her 2 children had similar symptoms.  They improved however she continues to feel unwell.  She has not been back to her PCP.  Does not use oxygen at baseline.  Not vaccine against COVID.  Unsure of exposures.  Her cough is productive of yellow sputum.  Denies additional aggravating or alleviating factors.  History obtained from patient and past medical records.  No interpreter is used.  HPI     Past Medical History:  Diagnosis Date  . Anxiety   . Arthritis   . Asthma   . Bipolar 1 disorder (HCC)   . COPD (chronic obstructive pulmonary disease) (HCC)   . Depression   . Diabetes mellitus without complication (HCC)   . Emphysema   . Obesity   . Pneumonia   . Rib fracture     Patient Active Problem List   Diagnosis Date Noted  . Snoring 10/03/2019  . Suspected COVID-19 virus infection 05/02/2018  . Bipolar 1 disorder (HCC) 05/02/2018  . OSA (obstructive sleep apnea) 05/01/2018  . Hypokalemia 05/01/2018  . Lactic acidosis 05/01/2018  . Acute on chronic respiratory failure with hypoxemia (HCC) 05/01/2018  . Sepsis (HCC) 05/01/2018  . COPD (chronic obstructive pulmonary disease) (HCC) 07/04/2016  . Acute respiratory  failure with hypoxia (HCC) 07/04/2016    Past Surgical History:  Procedure Laterality Date  . CHOLECYSTECTOMY    . TUBAL LIGATION       OB History    Gravida  6   Para  3   Term  3   Preterm  0   AB  3   Living  3     SAB  3   IAB  0   Ectopic  0   Multiple  0   Live Births              No family history on file.  Social History   Tobacco Use  . Smoking status: Former Smoker    Packs/day: 0.00    Quit date: 05/07/2011    Years since quitting: 8.7  . Smokeless tobacco: Never Used  Vaping Use  . Vaping Use: Former  Substance Use Topics  . Alcohol use: No  . Drug use: No    Home Medications Prior to Admission medications   Medication Sig Start Date End Date Taking? Authorizing Provider  doxycycline (VIBRAMYCIN) 100 MG capsule Take 1 capsule (100 mg total) by mouth 2 (two) times daily. 02/08/20  Yes Saraih Lorton A, PA-C  furosemide (LASIX) 20 MG tablet Take 1 tablet (20 mg total) by mouth daily for 7 days. 02/08/20 02/15/20 Yes Johnpatrick Jenny A, PA-C  potassium chloride (KLOR-CON) 10 MEQ tablet  Take 1 tablet (10 mEq total) by mouth daily. 02/08/20  Yes Keyara Ent A, PA-C  predniSONE (DELTASONE) 20 MG tablet Take 2 tablets (40 mg total) by mouth daily for 4 days. 02/08/20 02/12/20 Yes Corayma Cashatt A, PA-C  albuterol (PROVENTIL HFA;VENTOLIN HFA) 108 (90 Base) MCG/ACT inhaler Inhale 2 puffs into the lungs every 6 (six) hours as needed. For shortness of breath. 07/05/16   Kathlen Mody, MD  albuterol (PROVENTIL) (2.5 MG/3ML) 0.083% nebulizer solution Take 3 mLs (2.5 mg total) by nebulization every 6 (six) hours as needed for wheezing or shortness of breath. 08/06/19   Caccavale, Sophia, PA-C  beclomethasone (QVAR) 80 MCG/ACT inhaler Inhale 2 puffs into the lungs 2 (two) times daily.    [provider]  clonazePAM (KLONOPIN) 0.5 MG tablet Take 2 tablets (1 mg total) by mouth 2 (two) times daily as needed for anxiety (take 1mg  every am and .5mg   every pm ). 01/29/11 05/01/18  03/29/11, NP  cyclobenzaprine (FLEXERIL) 10 MG tablet Take 1 tablet (10 mg total) by mouth 2 (two) times daily as needed for muscle spasms. 06/20/19   Jethro Bastos, PA-C  escitalopram (LEXAPRO) 20 MG tablet Take 20 mg by mouth daily.      [provider]  fluconazole (DIFLUCAN) 150 MG tablet Take one tablet by mouth once. May repeat in 24-48 hours if needed 09/13/19   12-13-1979, MD  gabapentin (NEURONTIN) 300 MG capsule Take 300 mg by mouth 2 (two) times daily.     [provider]  ipratropium-albuterol (DUONEB) 0.5-2.5 (3) MG/3ML SOLN Take 3 mLs by nebulization every 4 (four) hours as needed. Patient not taking: Reported on 09/11/2019 07/05/16   09/13/2019, MD  lurasidone (LATUDA) 80 MG TABS tablet Take 80 mg by mouth daily with breakfast.    [provider]  omeprazole (PRILOSEC) 40 MG capsule Take 40 mg by mouth daily. 04/04/18   [provider]  promethazine-dextromethorphan (PROMETHAZINE-DM) 6.25-15 MG/5ML syrup Take 5 mLs by mouth 4 (four) times daily as needed for cough. Patient not taking: Reported on 09/11/2019 08/06/19   Caccavale, Sophia, PA-C  traZODone (DESYREL) 50 MG tablet Take 100 mg by mouth daily.  03/30/18   [provider]    Allergies    Iohexol  Review of Systems   Review of Systems  Constitutional: Positive for activity change, appetite change and fatigue.  HENT: Positive for congestion, postnasal drip and rhinorrhea.   Respiratory: Positive for cough and shortness of breath (Chronic, non worsening).   Cardiovascular: Negative.   Gastrointestinal: Negative.   Genitourinary: Negative.  Negative for difficulty urinating.  Musculoskeletal: Positive for myalgias.  Skin: Negative.   Neurological: Negative.   All other systems reviewed and are negative.   Physical Exam Updated Vital Signs BP (!) 122/95 (BP Location: Right Arm)   Pulse 98   Temp 98.3 F (36.8 C) (Oral)   Resp  (!) 21   Ht 5\' 7"  (1.702 m)   Wt (!) 154.2 kg   SpO2 99% Comment: After coughing  BMI 53.25 kg/m   Physical Exam Vitals and nursing note reviewed.  Constitutional:      General: She is not in acute distress.    Appearance: She is obese. She is not ill-appearing, toxic-appearing or diaphoretic.  HENT:     Head: Normocephalic and atraumatic.     Jaw: There is normal jaw occlusion.     Nose:     Comments: Clear rhinorrhea and congestion to  bilateral nares.  No sinus tenderness.    Mouth/Throat:     Lips: Pink.     Pharynx: Oropharynx is clear. Uvula midline.     Comments: Posterior oropharynx clear.  Mucous membranes moist.  Tonsils without erythema or exudate.  Uvula midline without deviation. Phonation normal. Neck:     Trachea: Trachea and phonation normal.     Meningeal: Brudzinski's sign and Kernig's sign present.     Comments: No Neck stiffness or neck rigidity.  No meningismus.  No cervical lymphadenopathy. Cardiovascular:     Comments: No murmurs rubs or gallops. Pulmonary:     Comments: Mild expiratory wheeze. No accessory muscle usage.  Able speak in full sentences. Abdominal:     Comments: Soft, nontender without rebound or guarding.  No CVA tenderness.  Musculoskeletal:     Cervical back: Full passive range of motion without pain and normal range of motion.     Comments: Moves all 4 extremities without difficulty.  Lower extremities without edema, erythema or warmth.  Skin:    Comments: Brisk capillary refill.  No rashes or lesions.  Neurological:     Mental Status: She is alert.     Comments: Ambulatory in department without difficulty.  Cranial nerves II through XII grossly intact.  No facial droop.  No aphasia.     ED Results / Procedures / Treatments   Labs (all labs ordered are listed, but only abnormal results are displayed) Labs Reviewed  CBC WITH DIFFERENTIAL/PLATELET - Abnormal; Notable for the following components:      Result Value   WBC 14.6 (*)     Neutro Abs 10.0 (*)    All other components within normal limits  BASIC METABOLIC PANEL - Abnormal; Notable for the following components:   Potassium 3.0 (*)    Glucose, Bld 132 (*)    Calcium 8.6 (*)    All other components within normal limits  SARS CORONAVIRUS 2 (TAT 6-24 HRS)  TROPONIN I (HIGH SENSITIVITY)  TROPONIN I (HIGH SENSITIVITY)    EKG None  Radiology DG Chest Portable 1 View  Result Date: 02/08/2020 CLINICAL DATA:  Cough, dyspnea EXAM: PORTABLE CHEST 1 VIEW COMPARISON:  08/06/2019 chest radiograph. FINDINGS: Stable cardiomediastinal silhouette with mild to moderate cardiomegaly. No pneumothorax. No pleural effusion. Borderline mild pulmonary edema. No acute consolidative airspace disease. IMPRESSION: Borderline mild congestive heart failure. Electronically Signed   By: Delbert Phenix M.D.   On: 02/08/2020 11:51    Procedures Procedures (including critical care time)  Medications Ordered in ED Medications  potassium chloride SA (KLOR-CON) CR tablet 40 mEq (40 mEq Oral Given 02/08/20 1224)    ED Course  I have reviewed the triage vital signs and the nursing notes.  Pertinent labs & imaging results that were available during my care of the patient were reviewed by me and considered in my medical decision making (see chart for details).  40 year old presents for evaluation of feeling unwell.  On arrival she is afebrile, nonseptic appearing.  She continues to have cough productive of yellow sputum despite being on antibiotics 3 weeks ago.  Had a rapid COVID test at that time however did not have a PCR test.  Has not been tested since did have sick contacts at that time.  Her symptoms improved however her did not.  She has no worsening shortness of breath.  No chest pain.  No unilateral leg swelling, redness or warmth.  No history of PE or DVT.  She is unvaccinated against COVID.  Mild wheeze. We will plan on chest x-ray, labs.  Labs and imaging personally reviewed and  interpreted:  Trop 3 CBC with leukocytosis at 14.6 BMP with mild hypokalemia, given replacement, glucose 132 DG chest mild cardiomegaly, possible mild pulmonary edema EKG --attempted, patient refused, states EKG leads irritating, took them off.  Patient received a phone call here in ED and states she "has to go quickly" to pick up her child from school as they are sick.  I discussed labs, imaging and concern for may be possible developing CHF.  I have placed referral to cardiology.  In meantime given she does have leukocytosis, COPD with some mild wheeze will treat for possible exacerbation.   Question cough multifactorial.  Given history of COPD we will do short course of steroids at discharge.  Will DC home with Doxy.  She does have some cardiomegaly and some possible mild vascular congestion on her chest x-ray.  We will have her follow-up with PCP.  We will give her a weeks worth of Lasix as well as some potassium supplementation as her potassium is low here in the ED as well.  She does not appear grossly fluid overloaded.  She has no hypoxia, oxygen saturation 99% on room air with movement.  I have low suspicion for ACS, PE, dissection, sepsis.  Discussed close follow-up outpatient.  We will also obtain COVID test, she will follow-up on results on MyChart.  Discussed close follow-up with PCP as well as cardiology, possible early CHF  The patient has been appropriately medically screened and/or stabilized in the ED. I have low suspicion for any other emergent medical condition which would require further screening, evaluation or treatment in the ED or require inpatient management.  Patient is hemodynamically stable and in no acute distress.  Patient able to ambulate in department prior to ED.  Evaluation does not show acute pathology that would require ongoing or additional emergent interventions while in the emergency department or further inpatient treatment.  I have discussed the diagnosis  with the patient and answered all questions.  Pain is been managed while in the emergency department and patient has no further complaints prior to discharge.  Patient is comfortable with plan discussed in room and is stable for discharge at this time.  I have discussed strict return precautions for returning to the emergency department.  Patient was encouraged to follow-up with PCP/specialist refer to at discharge.     MDM Rules/Calculators/A&P                          Jame L Adolph was evaluated in Emergency Department on 02/08/2020 for the symptoms described in the history of present illness. She was evaluated in the context of the global COVID-19 pandemic, which necessitated consideration that the patient might be at risk for infection with the SARS-CoV-2 virus that causes COVID-19. Institutional protocols and algorithms that pertain to the evaluation of patients at risk for COVID-19 are in a state of rapid change based on information released by regulatory bodies including the CDC and federal and state organizations. These policies and algorithms were followed during the patient's care in the ED. Final Clinical Impression(s) / ED Diagnoses Final diagnoses:  Cardiomegaly  Cough  Pulmonary vascular congestion  Viral URI with cough    Rx / DC Orders ED Discharge Orders         Ordered    potassium chloride (KLOR-CON) 10 MEQ tablet  Daily  02/08/20 1309    furosemide (LASIX) 20 MG tablet  Daily        02/08/20 1309    predniSONE (DELTASONE) 20 MG tablet  Daily        02/08/20 1309    doxycycline (VIBRAMYCIN) 100 MG capsule  2 times daily        02/08/20 1309    Ambulatory referral to Cardiology        02/08/20 1309           Liandro Thelin A, PA-C 02/08/20 1312    Derwood KaplanNanavati, Ankit, MD 02/09/20 1934

## 2020-02-08 NOTE — Discharge Instructions (Signed)
Take the medications as prescribed  Follow up with Primary care provider. May need to be seen by Cardiology for enlarged heart

## 2020-02-13 DIAGNOSIS — S2239XA Fracture of one rib, unspecified side, initial encounter for closed fracture: Secondary | ICD-10-CM | POA: Insufficient documentation

## 2020-02-13 DIAGNOSIS — F32A Depression, unspecified: Secondary | ICD-10-CM | POA: Insufficient documentation

## 2020-02-13 DIAGNOSIS — J189 Pneumonia, unspecified organism: Secondary | ICD-10-CM | POA: Insufficient documentation

## 2020-02-13 DIAGNOSIS — E119 Type 2 diabetes mellitus without complications: Secondary | ICD-10-CM | POA: Insufficient documentation

## 2020-02-13 DIAGNOSIS — E669 Obesity, unspecified: Secondary | ICD-10-CM | POA: Insufficient documentation

## 2020-02-13 DIAGNOSIS — M1990 Unspecified osteoarthritis, unspecified site: Secondary | ICD-10-CM | POA: Insufficient documentation

## 2020-02-13 DIAGNOSIS — F419 Anxiety disorder, unspecified: Secondary | ICD-10-CM | POA: Insufficient documentation

## 2020-02-17 NOTE — Progress Notes (Signed)
Cardiology Office Note:    Date:  02/18/2020   ID:  [name], DOB 11/28/1980, MRN [id number]  PCP:  Rometta Emery, MD  Cardiologist:  Norman Herrlich, MD   Referring MD: Linwood Dibbles, PA-C  ASSESSMENT:    1. Abnormal chest x-ray   2. Simple chronic bronchitis (HCC)   3. OSA (obstructive sleep apnea)    PLAN:    In order of problems listed above:  1. I told her I am unsure with the predictive accuracy of a portable chest x-ray in her case with COPD and morbid obesity in the ED.  She is at risk for heart failure with hypertension as well as pseudoheart failure from her sleep apnea.  On exam there is no gallop neck vein distention or edema.  In these cases BMP and proBNP levels are helpful to steroids and will attempt to do an echocardiogram may require contrast to define LV function filling pressures pulmonary pressure and right ventricular function.  At this time I would not put her on standing diuretic dosage. 2. Severe COPD quite symptomatic recent exacerbation ED visit 3. Continue BiPAP  Next appointment 3 weeks in the office after echo.   Medication Adjustments/Labs and Tests Ordered: Current medicines are reviewed at length with the patient today.  Concerns regarding medicines are outlined above.  No orders of the defined types were placed in this encounter.  No orders of the defined types were placed in this encounter.    Chief Complaint  Patient presents with  . Congestive Heart Failure    Seen in the emergency room question whether she has heart failure on the basis of a chest x-ray portable    History of Present Illness:    [name] is a 40 y.o. female with a history of hypertension who is being seen today for the evaluation of possible heart failure at the request of Henderly, Britni A, PA-C. He has a history of COPD and severe obstructive sleep apnea. To the emergency room 02/08/2020 chest x-ray portable showed borderline mild  congestive heart failure.  Laboratory studies performed including a high-sensitivity troponin that was normal but unfortunately did not have a BNP or proBNP level.  Component Ref Range & Units 9 d ago 6 mo ago  Troponin I (High Sensitivity) <18 ng/L 3  3 CM    She had an EKG ordered in the ED refused by the patient.  Her EKG in July independently reviewed by me sinus rhythm Marked left axis deviation poor R wave progression consider RVH or old anterior MI.  She is admitted to [facility] 2020 with hypoxic respiratory failure and sepsis and is unclear from looking at the discharge summary whether the problem was pneumonia or exacerbation of COPD  She has a history of COPD and a forty-pack-year smoking history.  Also has obstructive sleep apnea on BiPAP. She remains quite symptomatic she at times has to rest and needs assistance with ADLs and is short of breath walking room to room.  She no longer is capable of doing housework.  She continues to wheeze and has a cough and is presently on antibiotic after the ED visit.  She completed 1 week of torsemide without any improvement.  She said in the past people raise the issue but she has never been told she has heart disease no history of congenital rheumatic or heart murmur. She is at risk for heart failure with hypertension. She has edema in the ankles  at the end of the day and often sleeps 90 degrees in the bed. No chest pain but has palpitation at times not severe sustained and no syncope. She has finished her corticosteroid course Past Medical History:  Diagnosis Date  . Anxiety   . Arthritis   . Asthma   . Bipolar 1 disorder (HCC)   . COPD (chronic obstructive pulmonary disease) (HCC)   . Depression   . Diabetes mellitus without complication (HCC)   . Emphysema   . Obesity   . Pneumonia   . Rib fracture     Past Surgical History:  Procedure Laterality Date  . CHOLECYSTECTOMY    . TUBAL LIGATION      Current  Medications: Current Meds  Medication Sig  . albuterol (PROVENTIL HFA;VENTOLIN HFA) 108 (90 Base) MCG/ACT inhaler Inhale 2 puffs into the lungs every 6 (six) hours as needed. For shortness of breath.  Marland Kitchen albuterol (PROVENTIL) (2.5 MG/3ML) 0.083% nebulizer solution Take 3 mLs (2.5 mg total) by nebulization every 6 (six) hours as needed for wheezing or shortness of breath.  Marland Kitchen amLODipine (NORVASC) 10 MG tablet Take 10 mg by mouth daily.  . beclomethasone (QVAR) 80 MCG/ACT inhaler Inhale 2 puffs into the lungs 2 (two) times daily.  . clonazePAM (KLONOPIN) 0.5 MG tablet Take 2 tablets (1 mg total) by mouth 2 (two) times daily as needed for anxiety (take 1mg  every am and .5mg  every pm ).  . cyclobenzaprine (FLEXERIL) 10 MG tablet Take 1 tablet (10 mg total) by mouth 2 (two) times daily as needed for muscle spasms.  doxycycline (VIBRAMYCIN) 100 MG capsule Take 1 capsule (100 mg total) by mouth 2 (two) times daily.  escitalopram (LEXAPRO) 20 MG tablet Take 20 mg by mouth daily.  . fluticasone (FLONASE) 50 MCG/ACT nasal spray Place into both nostrils daily.  Marland Kitchen gabapentin (NEURONTIN) 300 MG capsule Take 300 mg by mouth 2 (two) times daily.  Marland Kitchen lurasidone (LATUDA) 80 MG TABS tablet Take 80 mg by mouth daily with breakfast.  . metFORMIN (GLUCOPHAGE) 500 MG tablet Take 500 mg by mouth 2 (two) times daily.  Marland Kitchen omeprazole (PRILOSEC) 40 MG capsule Take 40 mg by mouth daily.  . traMADol (ULTRAM) 50 MG tablet Take 50 mg by mouth every 6 (six) hours as needed.  . traZODone (DESYREL) 50 MG tablet Take 100 mg by mouth daily.   . valsartan (DIOVAN) 160 MG tablet Take 160 mg by mouth daily.     Allergies:   Iohexol   Social History   Socioeconomic History  . Marital status: Single    Spouse name: Not on file  . Number of children: Not on file  . Years of education: Not on file  . Highest education level: Not on file  Occupational History  . Not on file  Tobacco Use  . Smoking status: Former Smoker     Packs/day: 0.00    Quit date: 05/07/2011    Years since quitting: 8.7  . Smokeless tobacco: Never Used  Vaping Use  . Vaping Use: Former  Substance and Sexual Activity  . Alcohol use: No  . Drug use: No  . Sexual activity: Yes    Birth control/protection: Surgical  Other Topics Concern  . Not on file  Social History Narrative  . Not on file   Social Determinants of Health   Financial Resource Strain: Not on file  Food Insecurity: Food Insecurity Present  . Worried About Marland Kitchen in the Last  Year: Sometimes true  . Ran Out of Food in the Last Year: Sometimes true  Transportation Needs: No Transportation Needs  . Lack of Transportation (Medical): No  . Lack of Transportation (Non-Medical): No  Physical Activity: Not on file  Stress: Not on file  Social Connections: Not on file     Family History: The patient's family history is not on file.  ROS:   ROS Please see the history of present illness.     All other systems reviewed and are negative.  EKGs/Labs/Other Studies Reviewed:     Recent Labs: 08/06/2019: ALT 24 02/08/2020: BUN 10; Creatinine, Ser 0.55; Hemoglobin 13.8; Platelets 380; Potassium 3.0; Sodium 140  Recent Lipid Panel    Component Value Date/Time   CHOL 145 02/07/2007 2055   TRIG 79 02/07/2007 2055   HDL 39 (L) 02/07/2007 2055   CHOLHDL 3.7 Ratio 02/07/2007 2055   VLDL 16 02/07/2007 2055   LDLCALC 90 02/07/2007 2055    Physical Exam:    VS:  BP 104/74   Pulse (!) 106   Ht 5\' 7"  (1.702 m)   Wt (!) 339 lb (153.8 kg)   SpO2 98%   BMI 53.09 kg/m     Wt Readings from Last 3 Encounters:  02/18/20 (!) 339 lb (153.8 kg)  02/08/20 (!) 340 lb (154.2 kg)  10/03/19 (!) 345 lb (156.5 kg)     GEN: Marked central obesity BMI exceeds fifty well nourished, well developed in no acute distress HEENT: Normal NECK: No JVD; No carotid bruits LYMPHATICS: No lymphadenopathy CARDIAC: Has diminished heart sounds RRR, no murmurs, rubs,  gallops RESPIRATORY: Diminished breath sounds no wheezing or rales ABDOMEN: Soft, non-tender, non-distended MUSCULOSKELETAL:  No edema; No deformity  SKIN: Warm and dry NEUROLOGIC:  Alert and oriented x 3 PSYCHIATRIC:  Normal affect     Signed, 12/03/19, MD  02/18/2020 11:06 AM    [facility]

## 2020-02-18 ENCOUNTER — Ambulatory Visit (INDEPENDENT_AMBULATORY_CARE_PROVIDER_SITE_OTHER): Payer: Medicare Other | Admitting: Cardiology

## 2020-02-18 ENCOUNTER — Encounter: Payer: Self-pay | Admitting: Cardiology

## 2020-02-18 ENCOUNTER — Other Ambulatory Visit: Payer: Self-pay

## 2020-02-18 VITALS — BP 104/74 | HR 106 | Ht 67.0 in | Wt 339.0 lb

## 2020-02-18 DIAGNOSIS — R0602 Shortness of breath: Secondary | ICD-10-CM

## 2020-02-18 DIAGNOSIS — G4733 Obstructive sleep apnea (adult) (pediatric): Secondary | ICD-10-CM

## 2020-02-18 DIAGNOSIS — J410 Simple chronic bronchitis: Secondary | ICD-10-CM

## 2020-02-18 DIAGNOSIS — R9389 Abnormal findings on diagnostic imaging of other specified body structures: Secondary | ICD-10-CM

## 2020-02-18 NOTE — Patient Instructions (Signed)
Medication Instructions:  Your physician recommends that you continue on your current medications as directed. Please refer to the Current Medication list given to you today.  *If you need a refill on your cardiac medications before your next appointment, please call your pharmacy*   Lab Work: Your physician recommends that you return for lab work in: TODAY BMP, ProBNP If you have labs (blood work) drawn today and your tests are completely normal, you will receive your results only by: Marland Kitchen MyChart Message (if you have MyChart) OR . A paper copy in the mail If you have any lab test that is abnormal or we need to change your treatment, we will call you to review the results.   Testing/Procedures: Your physician has requested that you have an echocardiogram. Echocardiography is a painless test that uses sound waves to create images of your heart. It provides your doctor with information about the size and shape of your heart and how well your heart's chambers and valves are working. This procedure takes approximately one hour. There are no restrictions for this procedure.     Follow-Up: At [facility], you and your health needs are our priority.  As part of our continuing mission to provide you with exceptional heart care, we have created designated Provider Care Teams.  These Care Teams include your primary Cardiologist (physician) and Advanced Practice Providers (APPs -  Physician Assistants and Nurse Practitioners) who all work together to provide you with the care you need, when you need it.  We recommend signing up for the patient portal called "MyChart".  Sign up information is provided on this After Visit Summary.  MyChart is used to connect with patients for Virtual Visits (Telemedicine).  Patients are able to view lab/test results, encounter notes, upcoming appointments, etc.  Non-urgent messages can be sent to your provider as well.   To learn more about what you can do with MyChart,  go to [email or web address].    Your next appointment:   3 weeks  The format for your next appointment:   In Person  Provider:   Norman Herrlich, MD   Other Instructions

## 2020-02-19 ENCOUNTER — Telehealth: Payer: Self-pay

## 2020-02-19 ENCOUNTER — Telehealth: Payer: Self-pay | Admitting: Cardiology

## 2020-02-19 LAB — BASIC METABOLIC PANEL
BUN/Creatinine Ratio: 14 (ref 9–23)
BUN: 9 mg/dL (ref 6–20)
CO2: 26 mmol/L (ref 20–29)
Calcium: 9.2 mg/dL (ref 8.7–10.2)
Chloride: 98 mmol/L (ref 96–106)
Creatinine, Ser: 0.65 mg/dL (ref 0.57–1.00)
GFR calc Af Amer: 129 mL/min/{1.73_m2} (ref >59)
GFR calc non Af Amer: 112 mL/min/{1.73_m2} (ref >59)
Glucose: 179 mg/dL — ABNORMAL HIGH (ref 65–99)
Potassium: 3.4 mmol/L — ABNORMAL LOW (ref 3.5–5.2)
Sodium: 141 mmol/L (ref 134–144)

## 2020-02-19 LAB — PRO B NATRIURETIC PEPTIDE: NT-Pro BNP: 65 pg/mL (ref 0–130)

## 2020-02-19 MED ORDER — POTASSIUM CHLORIDE ER 10 MEQ PO TBCR: 10.0000 meq | EXTENDED_RELEASE_TABLET | Freq: Two times a day (BID) | ORAL | 0 refills | Status: DC

## 2020-02-19 NOTE — Telephone Encounter (Signed)
Follow Up     Pt is retuning your call Morgan's call, concerning her lab results.

## 2020-02-19 NOTE — Telephone Encounter (Signed)
Left message on patients voicemail to please return our call.   

## 2020-02-19 NOTE — Telephone Encounter (Signed)
-----   Message from Baldo Daub, MD sent at 02/19/2020  8:10 AM EST ----- The blood test for heart failure is normal we will still do the echocardiogram but I do not think she has congestive heart failure.  Continue potassium supplement I would make it twice daily and send her a new prescription for 1 month till she sees me or her PCP

## 2020-02-19 NOTE — Telephone Encounter (Signed)
Spoke with patient regarding results and recommendation.  Patient verbalizes understanding and is agreeable to plan of care. Advised patient to call back with any issues or concerns.  

## 2020-03-11 ENCOUNTER — Ambulatory Visit (HOSPITAL_BASED_OUTPATIENT_CLINIC_OR_DEPARTMENT_OTHER): Payer: Medicare Other

## 2020-03-16 ENCOUNTER — Other Ambulatory Visit: Payer: Self-pay | Admitting: Cardiology

## 2020-03-16 NOTE — Telephone Encounter (Signed)
Refill sent to pharmacy.   

## 2020-03-16 NOTE — Progress Notes (Unsigned)
Cardiology Office Note:    Date:  03/16/2020   ID:  [name], DOB 05/11/1980, MRN [id number]  PCP:  Rometta Emery, MD  Cardiologist:  Norman Herrlich, MD    Referring MD: Rometta Emery, MD    ASSESSMENT:    No diagnosis found. PLAN:    In order of problems listed above:  1. ***   Next appointment: ***   Medication Adjustments/Labs and Tests Ordered: Current medicines are reviewed at length with the patient today.  Concerns regarding medicines are outlined above.  No orders of the defined types were placed in this encounter.  No orders of the defined types were placed in this encounter.   No chief complaint on file.   History of Present Illness:    [name] is a 40 y.o. female with a hx of COPD severe obstructive sleep apnea hypertension and a chest x-ray suggesting possible congestive heart failure.  She was last seen 02/18/2020. Compliance with diet, lifestyle and medications: Yes  An echocardiogram was ordered at the last visit not performed. Her proBNP level is very low at 65 typically not consistent with heart failure. Past Medical History:  Diagnosis Date  . Anxiety   . Arthritis   . Asthma   . Bipolar 1 disorder (HCC)   . COPD (chronic obstructive pulmonary disease) (HCC)   . Depression   . Diabetes mellitus without complication (HCC)   . Emphysema   . Obesity   . Pneumonia   . Rib fracture     Past Surgical History:  Procedure Laterality Date  . CHOLECYSTECTOMY    . TUBAL LIGATION      Current Medications: No outpatient medications have been marked as taking for the 03/17/20 encounter (Appointment) with Baldo Daub, MD.     Allergies:   Iohexol   Social History   Socioeconomic History  . Marital status: Single    Spouse name: Not on file  . Number of children: Not on file  . Years of education: Not on file  . Highest education level: Not on file  Occupational History  . Not on file  Tobacco Use  .  Smoking status: Former Smoker    Packs/day: 0.00    Quit date: 05/07/2011    Years since quitting: 8.8  . Smokeless tobacco: Never Used  Vaping Use  . Vaping Use: Former  Substance and Sexual Activity  . Alcohol use: No  . Drug use: No  . Sexual activity: Yes    Birth control/protection: Surgical  Other Topics Concern  . Not on file  Social History Narrative  . Not on file   Social Determinants of Health   Financial Resource Strain: Not on file  Food Insecurity: Food Insecurity Present  . Worried About Programme researcher, broadcasting/film/video in the Last Year: Sometimes true  . Ran Out of Food in the Last Year: Sometimes true  Transportation Needs: No Transportation Needs  . Lack of Transportation (Medical): No  . Lack of Transportation (Non-Medical): No  Physical Activity: Not on file  Stress: Not on file  Social Connections: Not on file     Family History: The patient's ***family history includes COPD in her mother; Cirrhosis in her mother; Heart disease in her father; Heart failure in her maternal grandmother; Hypertension in her father. ROS:   Please see the history of present illness.    All other systems reviewed and are negative.  EKGs/Labs/Other Studies Reviewed:    The following studies  were reviewed today:  EKG:  EKG ordered today and personally reviewed.  The ekg ordered today demonstrates ***  Recent Labs: 08/06/2019: ALT 24 02/08/2020: Hemoglobin 13.8; Platelets 380 02/18/2020: BUN 9; Creatinine, Ser 0.65; NT-Pro BNP 65; Potassium 3.4; Sodium 141  Recent Lipid Panel    Component Value Date/Time   CHOL 145 02/07/2007 2055   TRIG 79 02/07/2007 2055   HDL 39 (L) 02/07/2007 2055   CHOLHDL 3.7 Ratio 02/07/2007 2055   VLDL 16 02/07/2007 2055   LDLCALC 90 02/07/2007 2055    Physical Exam:    VS:  There were no vitals taken for this visit.    Wt Readings from Last 3 Encounters:  02/18/20 (!) 339 lb (153.8 kg)  02/08/20 (!) 340 lb (154.2 kg)  10/03/19 (!) 345 lb (156.5 kg)      GEN: *** Well nourished, well developed in no acute distress HEENT: Normal NECK: No JVD; No carotid bruits LYMPHATICS: No lymphadenopathy CARDIAC: ***RRR, no murmurs, rubs, gallops RESPIRATORY:  Clear to auscultation without rales, wheezing or rhonchi  ABDOMEN: Soft, non-tender, non-distended MUSCULOSKELETAL:  No edema; No deformity  SKIN: Warm and dry NEUROLOGIC:  Alert and oriented x 3 PSYCHIATRIC:  Normal affect    Signed, Norman Herrlich, MD  03/16/2020 8:02 AM    [facility]

## 2020-03-17 ENCOUNTER — Ambulatory Visit: Payer: Medicare Other | Admitting: Cardiology

## 2020-04-22 NOTE — Progress Notes (Deleted)
Cardiology Office Note:    Date:  04/22/2020   ID:  [name], DOB 01-11-1981, MRN [id number]  PCP:  Rometta Emery, MD  Cardiologist:  Norman Herrlich, MD    Referring MD: Rometta Emery, MD    ASSESSMENT:    No diagnosis found. PLAN:    In order of problems listed above:  1. ***   Next appointment: ***   Medication Adjustments/Labs and Tests Ordered: Current medicines are reviewed at length with the patient today.  Concerns regarding medicines are outlined above.  No orders of the defined types were placed in this encounter.  No orders of the defined types were placed in this encounter.   No chief complaint on file.   History of Present Illness:    [name] is a 40 y.o. female with a hx of hypertension COPD severe obstructive sleep apnea and abnormal chest x-ray implying heart failure last seen 02/18/2020.  I advised an echocardiogram that has not been performed.  Clinically I did not feel she had heart failure and felt that she had severe underlying lung disease and COPD. Compliance with diet, lifestyle and medications: *** Past Medical History:  Diagnosis Date  . Anxiety   . Arthritis   . Asthma   . Bipolar 1 disorder (HCC)   . COPD (chronic obstructive pulmonary disease) (HCC)   . Depression   . Diabetes mellitus without complication (HCC)   . Emphysema   . Obesity   . Pneumonia   . Rib fracture     Past Surgical History:  Procedure Laterality Date  . CHOLECYSTECTOMY    . TUBAL LIGATION      Current Medications: No outpatient medications have been marked as taking for the 04/23/20 encounter (Appointment) with Baldo Daub, MD.     Allergies:   Iohexol   Social History   Socioeconomic History  . Marital status: Single    Spouse name: Not on file  . Number of children: Not on file  . Years of education: Not on file  . Highest education level: Not on file  Occupational History  . Not on file  Tobacco Use  . Smoking  status: Former Smoker    Packs/day: 0.00    Quit date: 05/07/2011    Years since quitting: 8.9  . Smokeless tobacco: Never Used  Vaping Use  . Vaping Use: Former  Substance and Sexual Activity  . Alcohol use: No  . Drug use: No  . Sexual activity: Yes    Birth control/protection: Surgical  Other Topics Concern  . Not on file  Social History Narrative  . Not on file   Social Determinants of Health   Financial Resource Strain: Not on file  Food Insecurity: Food Insecurity Present  . Worried About Programme researcher, broadcasting/film/video in the Last Year: Sometimes true  . Ran Out of Food in the Last Year: Sometimes true  Transportation Needs: No Transportation Needs  . Lack of Transportation (Medical): No  . Lack of Transportation (Non-Medical): No  Physical Activity: Not on file  Stress: Not on file  Social Connections: Not on file     Family History: The patient's ***family history includes COPD in her mother; Cirrhosis in her mother; Heart disease in her father; Heart failure in her maternal grandmother; Hypertension in her father. ROS:   Please see the history of present illness.    All other systems reviewed and are negative.  EKGs/Labs/Other Studies Reviewed:    The following  studies were reviewed today:  EKG:  EKG ordered today and personally reviewed.  The ekg ordered today demonstrates ***  Recent Labs: 08/06/2019: ALT 24 02/08/2020: Hemoglobin 13.8; Platelets 380 02/18/2020: BUN 9; Creatinine, Ser 0.65; NT-Pro BNP 65; Potassium 3.4; Sodium 141  Recent Lipid Panel    Component Value Date/Time   CHOL 145 02/07/2007 2055   TRIG 79 02/07/2007 2055   HDL 39 (L) 02/07/2007 2055   CHOLHDL 3.7 Ratio 02/07/2007 2055   VLDL 16 02/07/2007 2055   LDLCALC 90 02/07/2007 2055    Physical Exam:    VS:  There were no vitals taken for this visit.    Wt Readings from Last 3 Encounters:  02/18/20 (!) 339 lb (153.8 kg)  02/08/20 (!) 340 lb (154.2 kg)  10/03/19 (!) 345 lb (156.5 kg)      GEN: *** Well nourished, well developed in no acute distress HEENT: Normal NECK: No JVD; No carotid bruits LYMPHATICS: No lymphadenopathy CARDIAC: ***RRR, no murmurs, rubs, gallops RESPIRATORY:  Clear to auscultation without rales, wheezing or rhonchi  ABDOMEN: Soft, non-tender, non-distended MUSCULOSKELETAL:  No edema; No deformity  SKIN: Warm and dry NEUROLOGIC:  Alert and oriented x 3 PSYCHIATRIC:  Normal affect    Signed, Norman Herrlich, MD  04/22/2020 12:36 PM    [facility]

## 2020-04-23 ENCOUNTER — Ambulatory Visit: Payer: Medicare Other | Admitting: Cardiology

## 2020-04-24 ENCOUNTER — Other Ambulatory Visit: Payer: Self-pay | Admitting: Cardiology

## 2020-05-26 ENCOUNTER — Emergency Department (HOSPITAL_BASED_OUTPATIENT_CLINIC_OR_DEPARTMENT_OTHER)
Admission: EM | Admit: 2020-05-26 | Discharge: 2020-05-26 | Disposition: A | Discharge status: Home / Self Care | Payer: Medicare Other | Attending: Emergency Medicine | Admitting: Emergency Medicine

## 2020-05-26 ENCOUNTER — Other Ambulatory Visit: Payer: Self-pay

## 2020-05-26 ENCOUNTER — Encounter (HOSPITAL_BASED_OUTPATIENT_CLINIC_OR_DEPARTMENT_OTHER): Payer: Self-pay | Admitting: *Deleted

## 2020-05-26 DIAGNOSIS — Z5321 Procedure and treatment not carried out due to patient leaving prior to being seen by health care provider: Secondary | ICD-10-CM | POA: Diagnosis not present

## 2020-05-26 DIAGNOSIS — H53142 Visual discomfort, left eye: Secondary | ICD-10-CM | POA: Diagnosis not present

## 2020-05-26 NOTE — ED Notes (Signed)
Pt leaving to go to PCP, made staff aware that she was leaving. Pt left before MSE.

## 2020-05-26 NOTE — ED Triage Notes (Signed)
She woke yesterday with her left eye red with drainage.

## 2020-06-04 NOTE — Progress Notes (Deleted)
Cardiology Office Note:    Date:  06/04/2020   ID:  [name], DOB 04/26/1980, MRN [id number]  PCP:  Rometta Emery, MD  Cardiologist:  Norman Herrlich, MD    Referring MD: Rometta Emery, MD    ASSESSMENT:    No diagnosis found. PLAN:    In order of problems listed above:  1. ***   Next appointment: ***   Medication Adjustments/Labs and Tests Ordered: Current medicines are reviewed at length with the patient today.  Concerns regarding medicines are outlined above.  No orders of the defined types were placed in this encounter.  No orders of the defined types were placed in this encounter.   No chief complaint on file.   History of Present Illness:    [name] is a 40 y.o. female with a hx of hypertension COPD and severe obstructive sleep apnea last seen 02/18/2020.She was admitted to [facility] April 2020 with hypoxic respiratory failure and sepsis and is unclear from looking at the discharge summary whether the problem was pneumonia or exacerbation of COPD  She has a history of COPD and a forty-pack-year smoking history.  Also has obstructive sleep apnea on BiPAP. She remains quite symptomatic she at times has to rest and needs assistance with ADLs and is short of breath walking room to room.  She no longer is capable of doing housework.  She continues to wheeze and has a cough and is presently on antibiotic after the ED visit.  She completed 1 week of torsemide without any improvement. At the time of her last visit there was a question of heart failure from an emergency room x-ray and further evaluation I asked her to have a proBNP level performed is found to be quite low at 65 and echocardiogram was ordered but not performed. Compliance with diet, lifestyle and medications: *** Past Medical History:  Diagnosis Date  . Anxiety   . Arthritis   . Asthma   . Bipolar 1 disorder (HCC)   . COPD (chronic obstructive pulmonary disease) (HCC)    . Depression   . Diabetes mellitus without complication (HCC)   . Emphysema   . Obesity   . Pneumonia   . Rib fracture     Past Surgical History:  Procedure Laterality Date  . CHOLECYSTECTOMY    . TUBAL LIGATION      Current Medications: No outpatient medications have been marked as taking for the 06/05/20 encounter (Appointment) with Baldo Daub, MD.     Allergies:   Iohexol   Social History   Socioeconomic History  . Marital status: Single    Spouse name: Not on file  . Number of children: Not on file  . Years of education: Not on file  . Highest education level: Not on file  Occupational History  . Not on file  Tobacco Use  . Smoking status: Current Every Day Smoker    Packs/day: 0.00    Last attempt to quit: 05/07/2011    Years since quitting: 9.0  . Smokeless tobacco: Never Used  Vaping Use  . Vaping Use: Former  Substance and Sexual Activity  . Alcohol use: No  . Drug use: No  . Sexual activity: Yes    Birth control/protection: Surgical  Other Topics Concern  . Not on file  Social History Narrative  . Not on file   Social Determinants of Health   Financial Resource Strain: Not on file  Food Insecurity: Food Insecurity Present  .  Worried About Programme researcher, broadcasting/film/video in the Last Year: Sometimes true  . Ran Out of Food in the Last Year: Sometimes true  Transportation Needs: No Transportation Needs  . Lack of Transportation (Medical): No  . Lack of Transportation (Non-Medical): No  Physical Activity: Not on file  Stress: Not on file  Social Connections: Not on file     Family History: The patient's ***family history includes COPD in her mother; Cirrhosis in her mother; Heart disease in her father; Heart failure in her maternal grandmother; Hypertension in her father. ROS:   Please see the history of present illness.    All other systems reviewed and are negative.  EKGs/Labs/Other Studies Reviewed:    The following studies were reviewed  today:  EKG:  EKG ordered today and personally reviewed.  The ekg ordered today demonstrates ***  Recent Labs: 08/06/2019: ALT 24 02/08/2020: Hemoglobin 13.8; Platelets 380 02/18/2020: BUN 9; Creatinine, Ser 0.65; NT-Pro BNP 65; Potassium 3.4; Sodium 141  Recent Lipid Panel    Component Value Date/Time   CHOL 145 02/07/2007 2055   TRIG 79 02/07/2007 2055   HDL 39 (L) 02/07/2007 2055   CHOLHDL 3.7 Ratio 02/07/2007 2055   VLDL 16 02/07/2007 2055   LDLCALC 90 02/07/2007 2055    Physical Exam:    VS:  LMP 05/12/2020     Wt Readings from Last 3 Encounters:  05/26/20 (!) 340 lb (154.2 kg)  02/18/20 (!) 339 lb (153.8 kg)  02/08/20 (!) 340 lb (154.2 kg)     GEN: *** Well nourished, well developed in no acute distress HEENT: Normal NECK: No JVD; No carotid bruits LYMPHATICS: No lymphadenopathy CARDIAC: ***RRR, no murmurs, rubs, gallops RESPIRATORY:  Clear to auscultation without rales, wheezing or rhonchi  ABDOMEN: Soft, non-tender, non-distended MUSCULOSKELETAL:  No edema; No deformity  SKIN: Warm and dry NEUROLOGIC:  Alert and oriented x 3 PSYCHIATRIC:  Normal affect    Signed, Norman Herrlich, MD  06/04/2020 5:45 PM    [facility]

## 2020-06-05 ENCOUNTER — Ambulatory Visit: Payer: Medicare Other | Admitting: Cardiology

## 2020-06-07 ENCOUNTER — Emergency Department (HOSPITAL_COMMUNITY): Payer: Medicare Other

## 2020-06-07 ENCOUNTER — Emergency Department (HOSPITAL_COMMUNITY)
Admission: EM | Admit: 2020-06-07 | Discharge: 2020-06-07 | Discharge status: Against Medical Advice | Payer: Medicare Other | Attending: Emergency Medicine | Admitting: Emergency Medicine

## 2020-06-07 ENCOUNTER — Encounter (HOSPITAL_COMMUNITY): Payer: Self-pay | Admitting: Emergency Medicine

## 2020-06-07 DIAGNOSIS — J45909 Unspecified asthma, uncomplicated: Secondary | ICD-10-CM | POA: Insufficient documentation

## 2020-06-07 DIAGNOSIS — Y92481 Parking lot as the place of occurrence of the external cause: Secondary | ICD-10-CM | POA: Diagnosis not present

## 2020-06-07 DIAGNOSIS — R0600 Dyspnea, unspecified: Secondary | ICD-10-CM | POA: Diagnosis not present

## 2020-06-07 DIAGNOSIS — E119 Type 2 diabetes mellitus without complications: Secondary | ICD-10-CM | POA: Diagnosis not present

## 2020-06-07 DIAGNOSIS — J449 Chronic obstructive pulmonary disease, unspecified: Secondary | ICD-10-CM | POA: Insufficient documentation

## 2020-06-07 DIAGNOSIS — R0602 Shortness of breath: Secondary | ICD-10-CM | POA: Diagnosis present

## 2020-06-07 DIAGNOSIS — F17200 Nicotine dependence, unspecified, uncomplicated: Secondary | ICD-10-CM | POA: Insufficient documentation

## 2020-06-07 DIAGNOSIS — Z7984 Long term (current) use of oral hypoglycemic drugs: Secondary | ICD-10-CM | POA: Diagnosis not present

## 2020-06-07 DIAGNOSIS — Z7951 Long term (current) use of inhaled steroids: Secondary | ICD-10-CM | POA: Diagnosis not present

## 2020-06-07 DIAGNOSIS — F606 Avoidant personality disorder: Secondary | ICD-10-CM | POA: Diagnosis not present

## 2020-06-07 MED ORDER — LORAZEPAM 2 MG/ML IJ SOLN
0.5000 mg | Freq: Once | INTRAMUSCULAR | Status: DC
  Filled 2020-06-07: qty 1

## 2020-06-07 NOTE — ED Notes (Signed)
Attempted to get an EKG on pt. Pt refused stated "she just wants to go". MD made aware.
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
## 2020-06-07 NOTE — Discharge Instructions (Addendum)
Please return at any time.

## 2020-07-14 ENCOUNTER — Ambulatory Visit: Payer: Medicare Other | Admitting: Cardiology

## 2020-08-20 ENCOUNTER — Ambulatory Visit: Payer: Medicare Other | Admitting: Family Medicine

## 2020-08-27 ENCOUNTER — Ambulatory Visit: Payer: Medicare Other | Admitting: Obstetrics & Gynecology

## 2021-01-07 NOTE — Progress Notes (Deleted)
Date:  01/07/2021   ID:  OMARA ALCON, DOB 1980/05/08, MRN [id number]  PCP:  Rometta Emery, MD  Cardiologist:   Tessa Lerner, DO, [facility]  (established care 01/08/2021) Former Cardiology Providers: ***  REASON FOR CONSULT: Sinus tachycardia   REQUESTING PHYSICIAN:  Rometta Emery, MD [address] [place],  [place] [address]  No chief complaint on file.   HPI  [name] is a 40 y.o. *** female who presents to the office with a chief complaint of "***." Patient's past medical history and cardiovascular risk factors include: hypertension, ***  She is referred to the office at the request of Rometta Emery, MD for evaluation of Sinus tachycardia .  ***  History of  Denies prior history of coronary artery disease, myocardial infarction, congestive heart failure, deep venous thrombosis, pulmonary embolism, stroke, transient ischemic attack.  FUNCTIONAL STATUS: ***   ALLERGIES: Allergies  Allergen Reactions   Iohexol Cough    MEDICATION LIST PRIOR TO VISIT: No outpatient medications have been marked as taking for the 01/08/21 encounter (Appointment) with Tessa Lerner, DO.     PAST MEDICAL HISTORY: Past Medical History:  Diagnosis Date   Anxiety    Arthritis    Asthma    Bipolar 1 disorder (HCC)    COPD (chronic obstructive pulmonary disease) (HCC)    Depression    Diabetes mellitus without complication (HCC)    Emphysema    Obesity    Pneumonia    Rib fracture     PAST SURGICAL HISTORY: Past Surgical History:  Procedure Laterality Date   CHOLECYSTECTOMY     TUBAL LIGATION      FAMILY HISTORY: The patient family history includes COPD in her mother; Cirrhosis in her mother; Heart disease in her father; Heart failure in her maternal grandmother; Hypertension in her father.  SOCIAL HISTORY:  The patient  reports that she has been smoking. She has never used smokeless tobacco. She reports that she does not drink alcohol and does not use  drugs.  REVIEW OF SYSTEMS: ROS  PHYSICAL EXAM: Vitals with BMI 06/07/2020 06/07/2020 06/07/2020  Height - - -  Weight - - -  BMI - - -  Systolic - 164 164  Diastolic - 130 130  Pulse 134 845 135    CONSTITUTIONAL: Well-developed and well-nourished. No acute distress.  SKIN: Skin is warm and dry. No rash noted. No cyanosis. No pallor. No jaundice HEAD: Normocephalic and atraumatic.  EYES: No scleral icterus MOUTH/THROAT: Moist oral membranes.  NECK: No JVD present. No thyromegaly noted. No carotid bruits  LYMPHATIC: No visible cervical adenopathy.  CHEST Normal respiratory effort. No intercostal retractions  LUNGS: ***Clear to auscultation bilaterally.  No stridor. No wheezes. No rales.  CARDIOVASCULAR: ***Regular rate and rhythm, positive S1-S2, no murmurs rubs or gallops appreciated. ABDOMINAL: *** No apparent ascites.  EXTREMITIES: No peripheral edema, warm to touch, ***DP and PT pulses HEMATOLOGIC: No significant bruising NEUROLOGIC: Oriented to person, place, and time. Nonfocal. Normal muscle tone.  PSYCHIATRIC: Normal mood and affect. Normal behavior. Cooperative  CARDIAC DATABASE: EKG: ***  Echocardiogram: No results found for this or any previous visit from the past 1095 days.    Stress Testing: No results found for this or any previous visit from the past 1095 days.   Heart Catheterization: None  LABORATORY DATA: CBC Latest Ref Rng & Units 02/08/2020 08/06/2019 05/03/2018  WBC 4.0 - 10.5 K/uL 14.6(H) 12.0(H) 13.9(H)  Hemoglobin 12.0 - 15.0 g/dL 36.4 68.0 32.1  Hematocrit 36.0 - 46.0 % 41.4 44.1 39.0  Platelets 150 - 400 K/uL 380 405(H) 336    CMP Latest Ref Rng & Units 02/18/2020 02/08/2020 08/06/2019  Glucose 65 - 99 mg/dL 179(H) 132(H) 144(H)  BUN 6 - 20 mg/dL 9 10 8   Creatinine 0.57 - 1.00 mg/dL 0.65 0.55 0.62  Sodium 134 - 144 mmol/L 141 140 137  Potassium 3.5 - 5.2 mmol/L 3.4(L) 3.0(L) 3.9  Chloride 96 - 106 mmol/L 98 104 102  CO2 20 - 29 mmol/L 26 27  22   Calcium 8.7 - 10.2 mg/dL 9.2 8.6(L) 8.8(L)  Total Protein 6.5 - 8.1 g/dL - - 7.4  Total Bilirubin 0.3 - 1.2 mg/dL - - 0.7  Alkaline Phos 38 - 126 U/L - - 90  AST 15 - 41 U/L - - 17  ALT 0 - 44 U/L - - 24    Lipid Panel     Component Value Date/Time   CHOL 145 02/07/2007 2055   TRIG 79 02/07/2007 2055   HDL 39 (L) 02/07/2007 2055   CHOLHDL 3.7 Ratio 02/07/2007 2055   VLDL 16 02/07/2007 2055   LDLCALC 90 02/07/2007 2055    No components found for: NTPROBNP Recent Labs    02/18/20 1132  PROBNP 65   No results for input(s): TSH in the last 8760 hours.  BMP Recent Labs    02/08/20 1124 02/18/20 1132  NA 140 141  K 3.0* 3.4*  CL 104 98  CO2 27 26  GLUCOSE 132* 179*  BUN 10 9  CREATININE 0.55 0.65  CALCIUM 8.6* 9.2  GFRNONAA >60 112  GFRAA  --  129    HEMOGLOBIN A1C Lab Results  Component Value Date   HGBA1C 8.4 (H) 05/02/2018   MPG 194.38 05/02/2018    IMPRESSION:  No diagnosis found.   RECOMMENDATIONS: [name] is a 40 y.o. *** female whose past medical history and cardiac risk factors include: ***   FINAL MEDICATION LIST END OF ENCOUNTER: No orders of the defined types were placed in this encounter.   There are no discontinued medications.   Current Outpatient Medications:    albuterol (PROVENTIL HFA;VENTOLIN HFA) 108 (90 Base) MCG/ACT inhaler, Inhale 2 puffs into the lungs every 6 (six) hours as needed. For shortness of breath., Disp: 8 g, Rfl: 1   albuterol (PROVENTIL) (2.5 MG/3ML) 0.083% nebulizer solution, Take 3 mLs (2.5 mg total) by nebulization every 6 (six) hours as needed for wheezing or shortness of breath., Disp: 75 mL, Rfl: 0   amLODipine (NORVASC) 10 MG tablet, Take 10 mg by mouth daily., Disp: , Rfl:    beclomethasone (QVAR) 80 MCG/ACT inhaler, Inhale 2 puffs into the lungs 2 (two) times daily., Disp: , Rfl:    clonazePAM (KLONOPIN) 0.5 MG tablet, Take 2 tablets (1 mg total) by mouth 2 (two) times daily as needed for  anxiety (take 1mg  every am and .5mg  every pm )., Disp: 15 tablet, Rfl: 0   cyclobenzaprine (FLEXERIL) 10 MG tablet, Take 1 tablet (10 mg total) by mouth 2 (two) times daily as needed for muscle spasms., Disp: 10 tablet, Rfl: 0   doxycycline (VIBRAMYCIN) 100 MG capsule, Take 1 capsule (100 mg total) by mouth 2 (two) times daily., Disp: 20 capsule, Rfl: 0   escitalopram (LEXAPRO) 20 MG tablet, Take 20 mg by mouth daily., Disp: , Rfl:    fluconazole (DIFLUCAN) 150 MG tablet, Take one tablet by mouth once. May repeat in 24-48 hours if needed (Patient  not taking: Reported on 02/18/2020), Disp: 2 tablet, Rfl: 2   fluticasone (FLONASE) 50 MCG/ACT nasal spray, Place into both nostrils daily., Disp: , Rfl:    furosemide (LASIX) 20 MG tablet, Take 1 tablet (20 mg total) by mouth daily for 7 days., Disp: 7 tablet, Rfl: 0   gabapentin (NEURONTIN) 300 MG capsule, Take 300 mg by mouth 2 (two) times daily., Disp: , Rfl:    lurasidone (LATUDA) 80 MG TABS tablet, Take 80 mg by mouth daily with breakfast., Disp: , Rfl:    metFORMIN (GLUCOPHAGE) 500 MG tablet, Take 500 mg by mouth 2 (two) times daily., Disp: , Rfl:    omeprazole (PRILOSEC) 40 MG capsule, Take 40 mg by mouth daily., Disp: , Rfl:    potassium chloride (KLOR-CON) 10 MEQ tablet, TAKE ONE TABLET BY MOUTH TWICE A DAY, Disp: 60 tablet, Rfl: 5   traMADol (ULTRAM) 50 MG tablet, Take 50 mg by mouth every 6 (six) hours as needed., Disp: , Rfl:    traZODone (DESYREL) 50 MG tablet, Take 100 mg by mouth daily. , Disp: , Rfl:    valsartan (DIOVAN) 160 MG tablet, Take 160 mg by mouth daily., Disp: , Rfl:   No orders of the defined types were placed in this encounter.   There are no Patient Instructions on file for this visit.   --Continue cardiac medications as reconciled in final medication list. --No follow-ups on file. Or sooner if needed. --Continue follow-up with your primary care physician regarding the management of your other chronic comorbid  conditions.  Patient's questions and concerns were addressed to her satisfaction. She voices understanding of the instructions provided during this encounter.   This note was created using a voice recognition software as a result there may be grammatical errors inadvertently enclosed that do not reflect the nature of this encounter. Every attempt is made to correct such errors.  Rex Kras, [place], [facility]  Pager: [phone] Office: [phone]

## 2021-01-08 ENCOUNTER — Ambulatory Visit: Payer: Medicare Other | Admitting: Cardiology

## 2021-01-19 ENCOUNTER — Other Ambulatory Visit: Payer: Self-pay | Admitting: Cardiology

## 2021-01-26 ENCOUNTER — Ambulatory Visit: Payer: Medicare Other | Admitting: Cardiology

## 2021-02-10 ENCOUNTER — Ambulatory Visit: Payer: Medicare Other | Admitting: Cardiology

## 2021-02-15 ENCOUNTER — Ambulatory Visit: Payer: Medicare Other | Admitting: Cardiology

## 2021-02-15 ENCOUNTER — Encounter: Payer: Self-pay | Admitting: Cardiology

## 2021-02-15 ENCOUNTER — Other Ambulatory Visit: Payer: Self-pay

## 2021-02-15 VITALS — BP 108/80 | HR 74 | Temp 98.2°F | Ht 67.0 in | Wt 320.0 lb

## 2021-02-15 DIAGNOSIS — I10 Essential (primary) hypertension: Secondary | ICD-10-CM

## 2021-02-15 DIAGNOSIS — E119 Type 2 diabetes mellitus without complications: Secondary | ICD-10-CM

## 2021-02-15 DIAGNOSIS — E66813 Obesity, class 3: Secondary | ICD-10-CM

## 2021-02-15 DIAGNOSIS — R000 Tachycardia, unspecified: Secondary | ICD-10-CM

## 2021-02-15 DIAGNOSIS — R0602 Shortness of breath: Secondary | ICD-10-CM

## 2021-02-15 DIAGNOSIS — Z794 Long term (current) use of insulin: Secondary | ICD-10-CM

## 2021-02-15 DIAGNOSIS — R072 Precordial pain: Secondary | ICD-10-CM

## 2021-02-15 DIAGNOSIS — Z87891 Personal history of nicotine dependence: Secondary | ICD-10-CM

## 2021-02-15 NOTE — Progress Notes (Signed)
Date:  02/15/2021   ID:  [name], DOB 02-01-1980, MRN [id number]  PCP:  Elwyn Reach, MD  Cardiologist:  Rex Kras, DO, [facility] (established care 02/15/2021)  REASON FOR CONSULT: Sinus tachycardia and hypertension  REQUESTING PHYSICIAN:  Elwyn Reach, MD [address] [place],   [id number]  Chief Complaint  Patient presents with   Tachycardia   Hypertension   Chest Pain    HPI  Kashlyn L Kazlauskas is a 41 y.o. Caucasian female who presents to the office with a chief complaint of " palpitations and chest pain." Patient's past medical history and cardiovascular risk factors include: Hypertension, insulin-dependent diabetes, morbid obesity (BMI 50), COPD, former smoker, bipolar, OSA not on CPAP.  She is referred to the office at the request of Elwyn Reach, MD for evaluation of sinus tachycardia and hypertension.  Precordial pain: Present over the last 1 month, occurs daily, lasting around 7 minutes in duration, left-sided under her breast, intensity 5 out of 10, describes it like a ache/stabbing sensation, nonradiating, worsens with effort related activities such as going up a flight of stairs, better with resting/using inhalers/deep breaths.  Patient used to have an elevated heart rate with associated palpitations which have improved since the addition of metoprolol by her PCP.  Dad has had PCIs in his late 91s to early 38s.  No family history of premature coronary disease or sudden cardiac death.  FUNCTIONAL STATUS: No structured exercise program or daily routine.   ALLERGIES: Allergies  Allergen Reactions   Iohexol Cough    MEDICATION LIST PRIOR TO VISIT: Current Meds  Medication Sig   albuterol (PROVENTIL HFA;VENTOLIN HFA) 108 (90 Base) MCG/ACT inhaler Inhale 2 puffs into the lungs every 6 (six) hours as needed. For shortness of breath.   albuterol (PROVENTIL) (2.5 MG/3ML) 0.083% nebulizer solution Take 3 mLs (2.5 mg total) by  nebulization every 6 (six) hours as needed for wheezing or shortness of breath.   amLODipine (NORVASC) 10 MG tablet Take 10 mg by mouth daily.   beclomethasone (QVAR) 80 MCG/ACT inhaler Inhale 2 puffs into the lungs 2 (two) times daily.   clonazePAM (KLONOPIN) 0.5 MG tablet Take 2 tablets (1 mg total) by mouth 2 (two) times daily as needed for anxiety (take 1mg  every am and .5mg  every pm ).   cyclobenzaprine (FLEXERIL) 10 MG tablet Take 1 tablet (10 mg total) by mouth 2 (two) times daily as needed for muscle spasms.   escitalopram (LEXAPRO) 20 MG tablet Take 20 mg by mouth daily.   fluticasone (FLONASE) 50 MCG/ACT nasal spray Place into both nostrils daily.   gabapentin (NEURONTIN) 300 MG capsule Take 300 mg by mouth 2 (two) times daily.   lurasidone (LATUDA) 80 MG TABS tablet Take 80 mg by mouth daily with breakfast.   metFORMIN (GLUCOPHAGE) 500 MG tablet Take 500 mg by mouth 2 (two) times daily.   metoprolol tartrate (LOPRESSOR) 50 MG tablet Take 50 mg by mouth daily.   omeprazole (PRILOSEC) 40 MG capsule Take 40 mg by mouth daily.   ondansetron (ZOFRAN) 4 MG tablet Take 4 mg by mouth every 8 (eight) hours as needed.   potassium chloride (KLOR-CON) 10 MEQ tablet TAKE ONE TABLET BY MOUTH TWICE A DAY   traMADol (ULTRAM) 50 MG tablet Take 50 mg by mouth every 6 (six) hours as needed.   traZODone (DESYREL) 50 MG tablet Take 100 mg by mouth daily.    valsartan (DIOVAN) 160 MG tablet Take  160 mg by mouth daily.   zolpidem (AMBIEN) 10 MG tablet Take 10 mg by mouth at bedtime as needed.     PAST MEDICAL HISTORY: Past Medical History:  Diagnosis Date   Anxiety    Arthritis    Asthma    Bipolar 1 disorder (HCC)    COPD (chronic obstructive pulmonary disease) ([place])    Depression    Diabetes mellitus without complication (HCC)    Emphysema    Obesity    Pneumonia    Rib fracture     PAST SURGICAL HISTORY: Past Surgical History:  Procedure Laterality Date   CHOLECYSTECTOMY     TUBAL  LIGATION      FAMILY HISTORY: The patient family history includes COPD in her mother; Cirrhosis in her mother; Diabetes in her paternal grandmother; Heart disease in her father; Heart failure in her maternal grandmother and paternal grandmother; Hypertension in her father; Mental illness in her maternal grandmother and paternal grandmother.  SOCIAL HISTORY:  The patient  reports that she quit smoking about 12 months ago. Her smoking use included cigarettes. She has a 30.00 pack-year smoking history. She has never used smokeless tobacco. She reports that she does not drink alcohol and does not use drugs.  REVIEW OF SYSTEMS: Review of Systems  Constitutional: Negative for chills and fever.  HENT:  Negative for hoarse voice and nosebleeds.   Eyes:  Negative for discharge, double vision and pain.  Cardiovascular:  Positive for chest pain and palpitations. Negative for claudication, dyspnea on exertion, leg swelling, near-syncope, orthopnea, paroxysmal nocturnal dyspnea and syncope.  Respiratory:  Negative for hemoptysis and shortness of breath.   Musculoskeletal:  Negative for muscle cramps and myalgias.  Gastrointestinal:  Negative for abdominal pain, constipation, diarrhea, hematemesis, hematochezia, melena, nausea and vomiting.  Neurological:  Negative for dizziness and light-headedness.   PHYSICAL EXAM: Vitals with BMI 02/15/2021 02/15/2021 06/07/2020  Height - 5\' 7"  -  Weight - 320 lbs -  BMI - [id number] -  Systolic 123XX123 93 -  Diastolic 80 65 -  Pulse 74 85 134    CONSTITUTIONAL: Appears older than stated age, cushingoid appearance, morbidly obese. No acute distress.  SKIN: Skin is warm and dry. No rash noted. No cyanosis. No pallor. No jaundice HEAD: Normocephalic and atraumatic.  EYES: No scleral icterus MOUTH/THROAT: Moist oral membranes.  NECK: Unable to evaluate JVP due to short neck stature and adipose tissue. No thyromegaly noted. No carotid bruits. Humped back (cushingoid).   LYMPHATIC: No visible cervical adenopathy.  CHEST Normal respiratory effort. No intercostal retractions  LUNGS: Clear to auscultation bilaterally.  No stridor. No wheezes. No rales.  CARDIOVASCULAR: Regular rate and rhythm, positive S1-S2, no murmurs rubs or gallops appreciated. ABDOMINAL: Obese, soft, nontender, nondistended,, positive bowel sounds in all 4 quadrants, no apparent ascites.  EXTREMITIES: No peripheral edema, warm to touch, +1 bilateral DP and PT pulses HEMATOLOGIC: No significant bruising NEUROLOGIC: Oriented to person, place, and time. Nonfocal. Normal muscle tone.  PSYCHIATRIC: Normal mood and affect. Normal behavior. Cooperative  CARDIAC DATABASE: EKG: 02/15/2021: NSR, 82bpm, PRWP, Left axis, LAFB, PVC.  Echocardiogram: No results found for this or any previous visit from the past 1095 days.   Stress Testing: No results found for this or any previous visit from the past 1095 days.  Heart Catheterization: None  LABORATORY DATA: CBC Latest Ref Rng & Units 02/08/2020 08/06/2019 05/03/2018  WBC 4.0 - 10.5 K/uL 14.6(H) 12.0(H) 13.9(H)  Hemoglobin 12.0 - 15.0 g/dL 13.8 14.1 12.2  Hematocrit 36.0 - 46.0 % 41.4 44.1 39.0  Platelets 150 - 400 K/uL 380 405(H) 336    CMP Latest Ref Rng & Units 02/18/2020 02/08/2020 08/06/2019  Glucose 65 - 99 mg/dL 179(H) 132(H) 144(H)  BUN 6 - 20 mg/dL 9 10 8   Creatinine 0.57 - 1.00 mg/dL 0.65 0.55 0.62  Sodium 134 - 144 mmol/L 141 140 137  Potassium 3.5 - 5.2 mmol/L 3.4(L) 3.0(L) 3.9  Chloride 96 - 106 mmol/L 98 104 102  CO2 20 - 29 mmol/L 26 27 22   Calcium 8.7 - 10.2 mg/dL 9.2 8.6(L) 8.8(L)  Total Protein 6.5 - 8.1 g/dL - - 7.4  Total Bilirubin 0.3 - 1.2 mg/dL - - 0.7  Alkaline Phos 38 - 126 U/L - - 90  AST 15 - 41 U/L - - 17  ALT 0 - 44 U/L - - 24    Lipid Panel     Component Value Date/Time   CHOL 145 02/07/2007 2055   TRIG 79 02/07/2007 2055   HDL 39 (L) 02/07/2007 2055   CHOLHDL 3.7 Ratio 02/07/2007 2055   VLDL 16  02/07/2007 2055   LDLCALC 90 02/07/2007 2055    No components found for: NTPROBNP Recent Labs    02/18/20 1132  PROBNP 65   No results for input(s): TSH in the last 8760 hours.  BMP Recent Labs    02/18/20 1132  NA 141  K 3.4*  CL 98  CO2 26  GLUCOSE 179*  BUN 9  CREATININE 0.65  CALCIUM 9.2  GFRNONAA 112  GFRAA 129    HEMOGLOBIN A1C Lab Results  Component Value Date   HGBA1C 8.4 (H) 05/02/2018   MPG 194.38 05/02/2018    IMPRESSION:    ICD-10-CM   1. Tachycardia  R00.0 EKG XX123456    Basic metabolic panel    TSH    LONG TERM MONITOR (3-14 DAYS)    2. Precordial pain  R07.2 PCV ECHOCARDIOGRAM COMPLETE    CT CARDIAC SCORING ([facility] LOCATIONS ONLY)    PCV MYOCARDIAL PERFUSION WO LEXISCAN    Pregnancy, urine    3. Shortness of breath  R06.02 B Nat Peptide    4. Benign hypertension  I10     5. Type 2 diabetes mellitus without complication, with long-term current use of insulin (HCC)  E11.9    Z79.4     6. Long-term insulin use (HCC)  Z79.4     7. Former smoker  Z87.891     4. Class 3 severe obesity due to excess calories with serious comorbidity and body mass index (BMI) of 50.0 to 59.9 in adult [facility])  E66.01    Z68.43        RECOMMENDATIONS: Nakeyia L Fort is a 41 y.o. Caucasian female whose past medical history and cardiac risk factors include: Hypertension, insulin-dependent diabetes, morbid obesity (BMI 50), COPD, former smoker, bipolar, OSA not on CPAP.  Tachycardia: Multifactorial Check TSH. 14-day extended Holter monitor to evaluate for dysrhythmias.  Precordial pain /shortness of breath: Her chest discomfort appears to be possible cardiac etiology and given her multiple cardiovascular risk factors including insulin-dependent diabetes, former smoker recommend an ischemic evaluation. Echocardiogram will be ordered to evaluate for structural heart disease and left ventricular systolic function. Plan exercise nuclear stress test. Coronary  calcium score Educated on seeking medical attention sooner by going to the closest ER via EMS if the symptoms increase in intensity, frequency, duration, or has typical chest pain as discussed in the office.  Patient verbalized  understanding. Check TSH, BMP, NT proBNP  Benign hypertension Referred to the practice for management of blood pressure.  Her office blood pressures are relatively well controlled.  She is currently asymptomatic. Unable to reconcile medications accurately and she does not recall the doses of the medications Patient to bring the caridac medication bottles at the next office visit to help facilitate a more accurate medication reconciliation.  Type 2 diabetes mellitus without complication, with long-term current use of insulin (HCC) Currently on insulin. Currently on ARB for blood pressure and renal protection. Recommend statin therapy. Patient states that her PCP recently checked labs.  Will await results  Class 3 severe obesity due to excess calories with serious comorbidity and body mass index (BMI) of 50.0 to 59.9 in adult [facility]) Body mass index is 50.12 kg/m. I reviewed with the patient the importance of diet, regular physical activity/exercise, weight loss.   Patient is educated on increasing physical activity gradually as tolerated.  With the goal of moderate intensity exercise for 30 minutes a day 5 days a week.  As part of this initial consultation reviewed outside records provided by PCP which included office note findings have been summarized and noted above for further reference.  Discussed disease management, ordering diagnostic testing, coordination of care and patient education provided as a part of today's encounter.  Requesting labs from PCPs office.  FINAL MEDICATION LIST END OF ENCOUNTER: No orders of the defined types were placed in this encounter.   There are no discontinued medications.   Current Outpatient Medications:    albuterol (PROVENTIL  HFA;VENTOLIN HFA) 108 (90 Base) MCG/ACT inhaler, Inhale 2 puffs into the lungs every 6 (six) hours as needed. For shortness of breath., Disp: 8 g, Rfl: 1   albuterol (PROVENTIL) (2.5 MG/3ML) 0.083% nebulizer solution, Take 3 mLs (2.5 mg total) by nebulization every 6 (six) hours as needed for wheezing or shortness of breath., Disp: 75 mL, Rfl: 0   amLODipine (NORVASC) 10 MG tablet, Take 10 mg by mouth daily., Disp: , Rfl:    beclomethasone (QVAR) 80 MCG/ACT inhaler, Inhale 2 puffs into the lungs 2 (two) times daily., Disp: , Rfl:    clonazePAM (KLONOPIN) 0.5 MG tablet, Take 2 tablets (1 mg total) by mouth 2 (two) times daily as needed for anxiety (take 1mg  every am and .5mg  every pm )., Disp: 15 tablet, Rfl: 0   cyclobenzaprine (FLEXERIL) 10 MG tablet, Take 1 tablet (10 mg total) by mouth 2 (two) times daily as needed for muscle spasms., Disp: 10 tablet, Rfl: 0   escitalopram (LEXAPRO) 20 MG tablet, Take 20 mg by mouth daily., Disp: , Rfl:    fluticasone (FLONASE) 50 MCG/ACT nasal spray, Place into both nostrils daily., Disp: , Rfl:    gabapentin (NEURONTIN) 300 MG capsule, Take 300 mg by mouth 2 (two) times daily., Disp: , Rfl:    lurasidone (LATUDA) 80 MG TABS tablet, Take 80 mg by mouth daily with breakfast., Disp: , Rfl:    metFORMIN (GLUCOPHAGE) 500 MG tablet, Take 500 mg by mouth 2 (two) times daily., Disp: , Rfl:    metoprolol tartrate (LOPRESSOR) 50 MG tablet, Take 50 mg by mouth daily., Disp: , Rfl:    omeprazole (PRILOSEC) 40 MG capsule, Take 40 mg by mouth daily., Disp: , Rfl:    ondansetron (ZOFRAN) 4 MG tablet, Take 4 mg by mouth every 8 (eight) hours as needed., Disp: , Rfl:    potassium chloride (KLOR-CON) 10 MEQ tablet, TAKE ONE TABLET BY MOUTH  TWICE A DAY, Disp: 60 tablet, Rfl: 5   traMADol (ULTRAM) 50 MG tablet, Take 50 mg by mouth every 6 (six) hours as needed., Disp: , Rfl:    traZODone (DESYREL) 50 MG tablet, Take 100 mg by mouth daily. , Disp: , Rfl:    valsartan (DIOVAN) 160  MG tablet, Take 160 mg by mouth daily., Disp: , Rfl:    zolpidem (AMBIEN) 10 MG tablet, Take 10 mg by mouth at bedtime as needed., Disp: , Rfl:    doxycycline (VIBRAMYCIN) 100 MG capsule, Take 1 capsule (100 mg total) by mouth 2 (two) times daily. (Patient not taking: Reported on 02/15/2021), Disp: 20 capsule, Rfl: 0   fluconazole (DIFLUCAN) 150 MG tablet, Take one tablet by mouth once. May repeat in 24-48 hours if needed (Patient not taking: Reported on 02/18/2020), Disp: 2 tablet, Rfl: 2   furosemide (LASIX) 20 MG tablet, Take 1 tablet (20 mg total) by mouth daily for 7 days. (Patient not taking: Reported on 02/15/2021), Disp: 7 tablet, Rfl: 0   Insulin Lispro Prot & Lispro (HUMALOG 75/25 MIX) (75-25) 100 UNIT/ML Kwikpen, Inject 20 Units into the skin 2 (two) times daily., Disp: , Rfl:   Orders Placed This Encounter  Procedures   CT CARDIAC SCORING ([facility] LOCATIONS ONLY)   Basic metabolic panel   TSH   Pregnancy, urine   B Nat Peptide   LONG TERM MONITOR (3-14 DAYS)   PCV MYOCARDIAL PERFUSION WO LEXISCAN   EKG 12-Lead   PCV ECHOCARDIOGRAM COMPLETE    There are no Patient Instructions on file for this visit.   --Continue cardiac medications as reconciled in final medication list. --Return in about 4 weeks (around 03/15/2021) for Chest pain. Or sooner if needed. --Continue follow-up with your primary care physician regarding the management of your other chronic comorbid conditions.  Patient's questions and concerns were addressed to her satisfaction. She voices understanding of the instructions provided during this encounter.   This note was created using a voice recognition software as a result there may be grammatical errors inadvertently enclosed that do not reflect the nature of this encounter. Every attempt is made to correct such errors.  Rex Kras, [place], [facility]  Pager: [phone] Office: [phone]

## 2021-02-16 LAB — BASIC METABOLIC PANEL
BUN/Creatinine Ratio: 11 (ref 9–23)
BUN: 7 mg/dL (ref 6–24)
CO2: 30 mmol/L — ABNORMAL HIGH (ref 20–29)
Calcium: 9.5 mg/dL (ref 8.7–10.2)
Chloride: 99 mmol/L (ref 96–106)
Creatinine, Ser: 0.64 mg/dL (ref 0.57–1.00)
Glucose: 120 mg/dL — ABNORMAL HIGH (ref 70–99)
Potassium: 3.3 mmol/L — ABNORMAL LOW (ref 3.5–5.2)
Sodium: 140 mmol/L (ref 134–144)
eGFR: 114 mL/min/{1.73_m2} (ref >59)

## 2021-02-16 LAB — BRAIN NATRIURETIC PEPTIDE: BNP: 67 pg/mL (ref 0.0–100.0)

## 2021-02-16 LAB — TSH: TSH: 3.5 u[IU]/mL (ref 0.450–4.500)

## 2021-02-16 LAB — PREGNANCY, URINE: Preg Test, Ur: POSITIVE — AB

## 2021-02-16 NOTE — Progress Notes (Signed)
I spoke to patient she stated she will f/u with her doctor because she was unaware and once she knows she will give [place] a call back

## 2021-02-17 ENCOUNTER — Inpatient Hospital Stay: Payer: Medicare Other

## 2021-02-17 ENCOUNTER — Other Ambulatory Visit: Payer: Medicare Other

## 2021-02-17 DIAGNOSIS — R000 Tachycardia, unspecified: Secondary | ICD-10-CM

## 2021-02-28 NOTE — Progress Notes (Signed)
External Labs: °Collected: 11/20/2020 provided primary care  °Total cholesterol 150, HDL 26, triglycerides 275, LDL 88, non-HDL 124. °Glucose 180. °BUN 10, creatinine 0.66. °eGFR 114 mL/min per 1.73m2 °Sodium 137, potassium 4.3, chloride 105, bicarb 25, °Albumin 3.7. °A1c 6.9. °White count 18.8. °Hemoglobin 15.5/hematocrit 45.3% ° °We will review the results at the upcoming office visit.

## 2021-03-02 ENCOUNTER — Other Ambulatory Visit: Payer: Medicare Other

## 2021-03-04 ENCOUNTER — Other Ambulatory Visit: Payer: Self-pay | Admitting: Cardiology

## 2021-03-15 ENCOUNTER — Other Ambulatory Visit: Payer: Medicare Other

## 2021-03-15 ENCOUNTER — Inpatient Hospital Stay: Payer: Medicare Other

## 2021-03-22 ENCOUNTER — Ambulatory Visit: Payer: Medicare Other | Admitting: Cardiology

## 2021-03-23 ENCOUNTER — Ambulatory Visit: Payer: Medicare Other | Admitting: Cardiology

## 2021-03-24 ENCOUNTER — Other Ambulatory Visit: Payer: Medicare Other

## 2021-03-24 ENCOUNTER — Inpatient Hospital Stay: Payer: Medicare Other

## 2021-04-12 ENCOUNTER — Inpatient Hospital Stay: Payer: Medicare Other

## 2021-04-12 ENCOUNTER — Other Ambulatory Visit: Payer: Medicare Other

## 2021-04-21 IMAGING — DX DG LUMBAR SPINE COMPLETE 4+V
5 series · 5 of 5 positions shown · non-contrast
Comparison: 05/23/2012

CLINICAL DATA: Midline low back pain.

EXAM:
LUMBAR SPINE - COMPLETE 4+ VIEW

[l-spine ap]
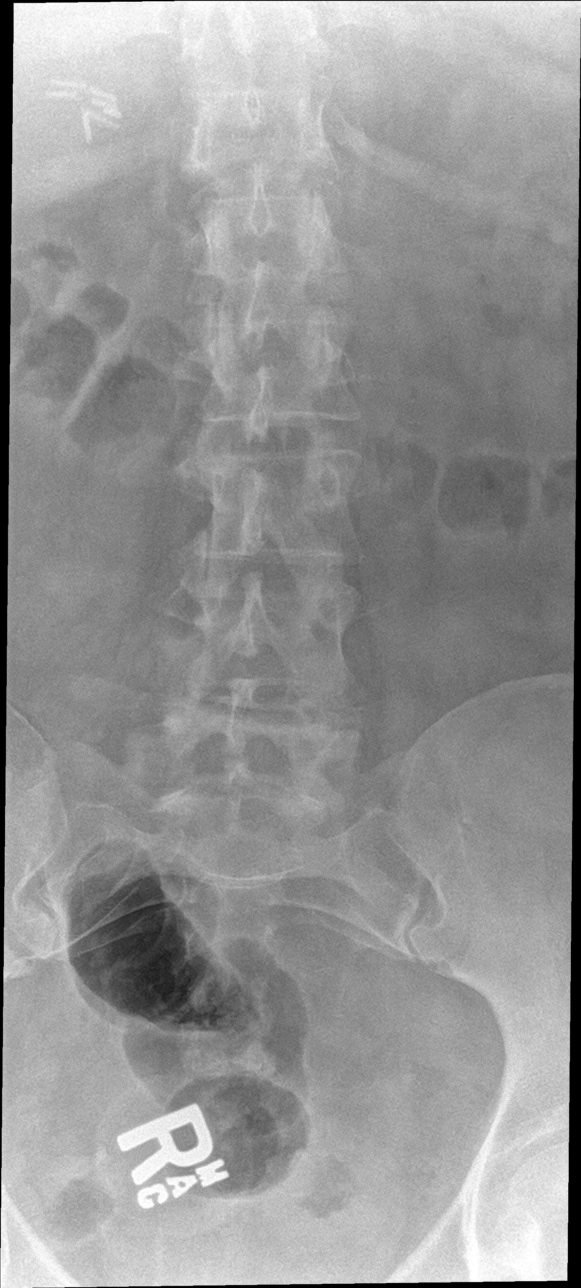

[l-spine obl (1 of 2)]
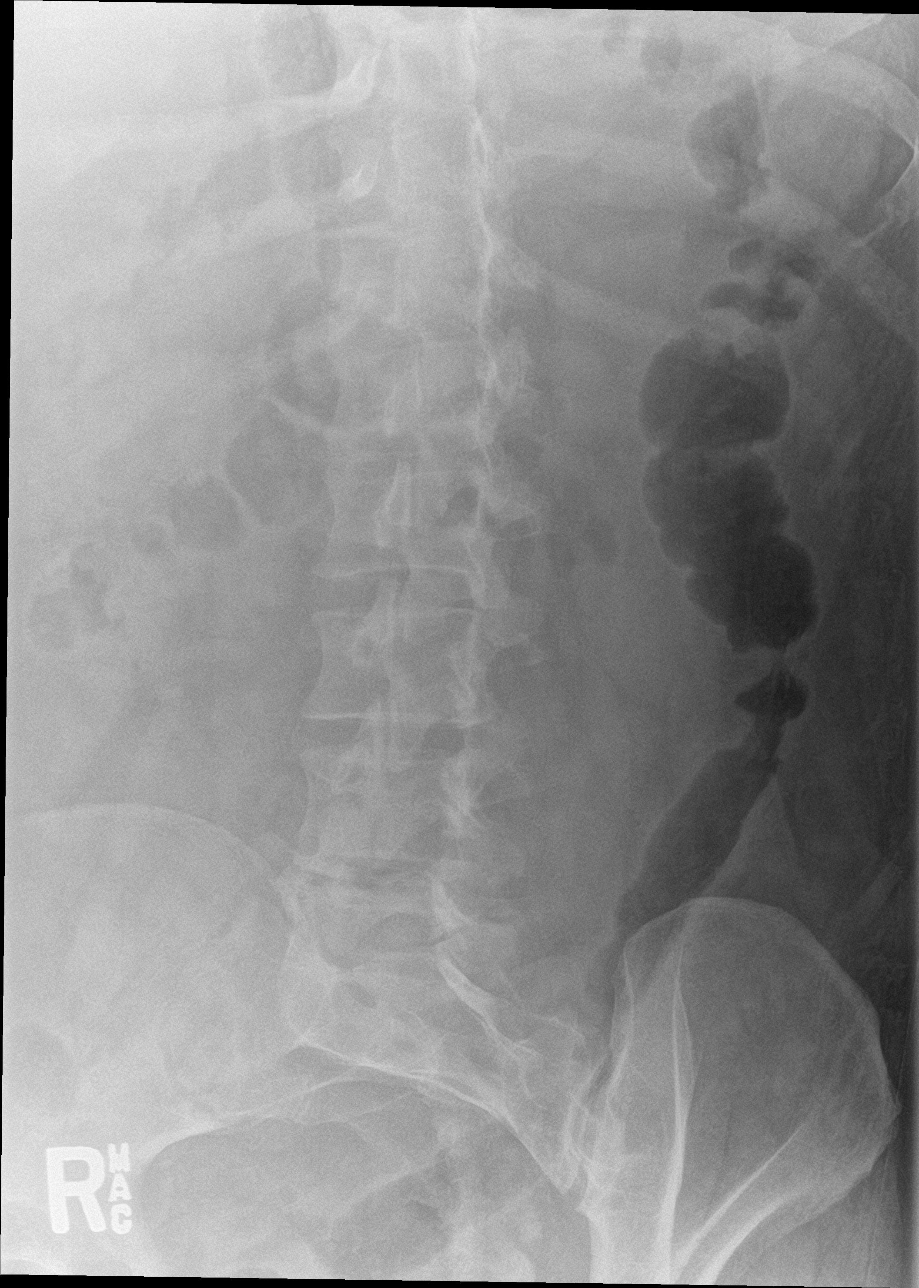

[l-spine obl (2 of 2)]
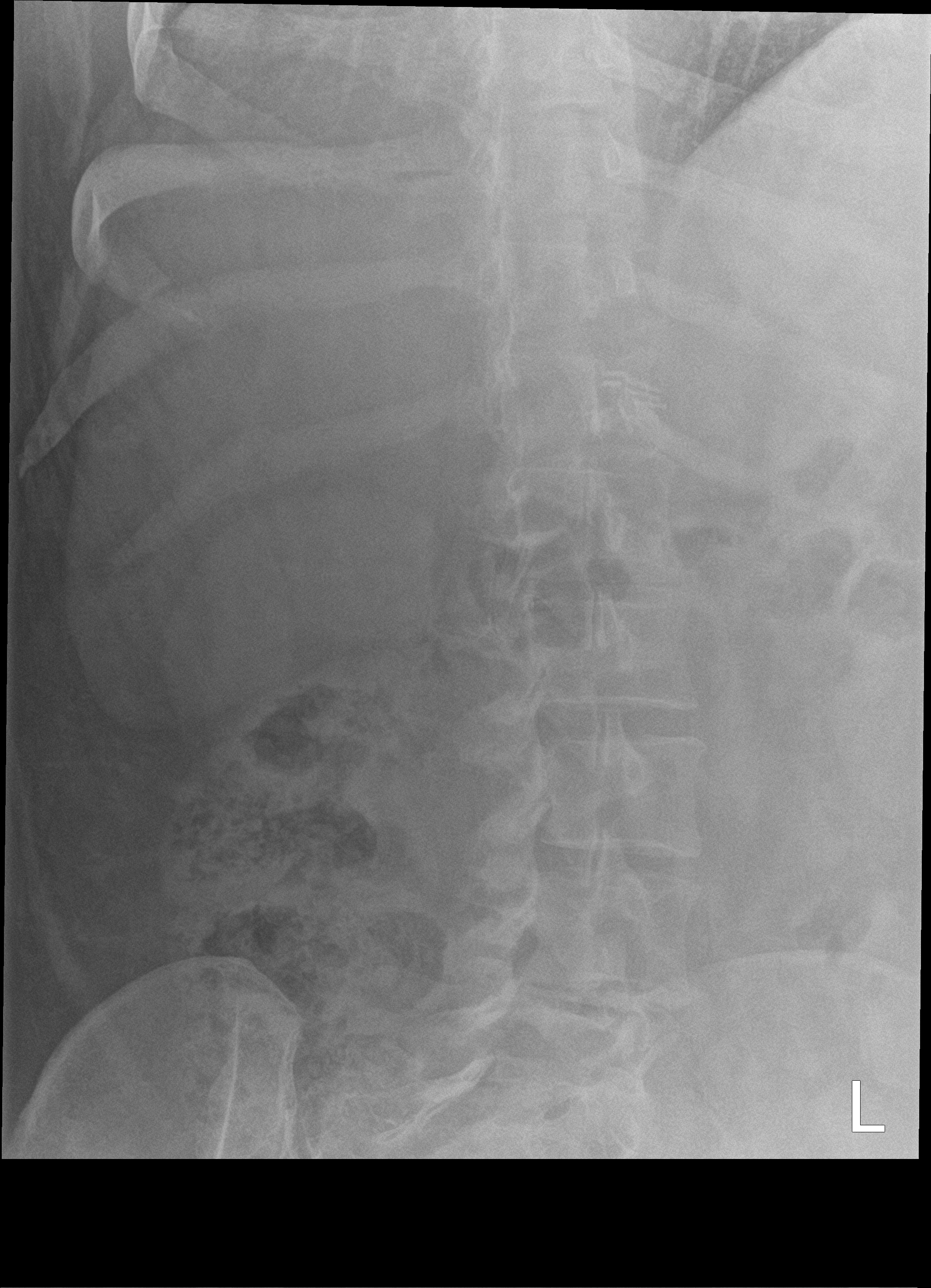

[l-spine lat]
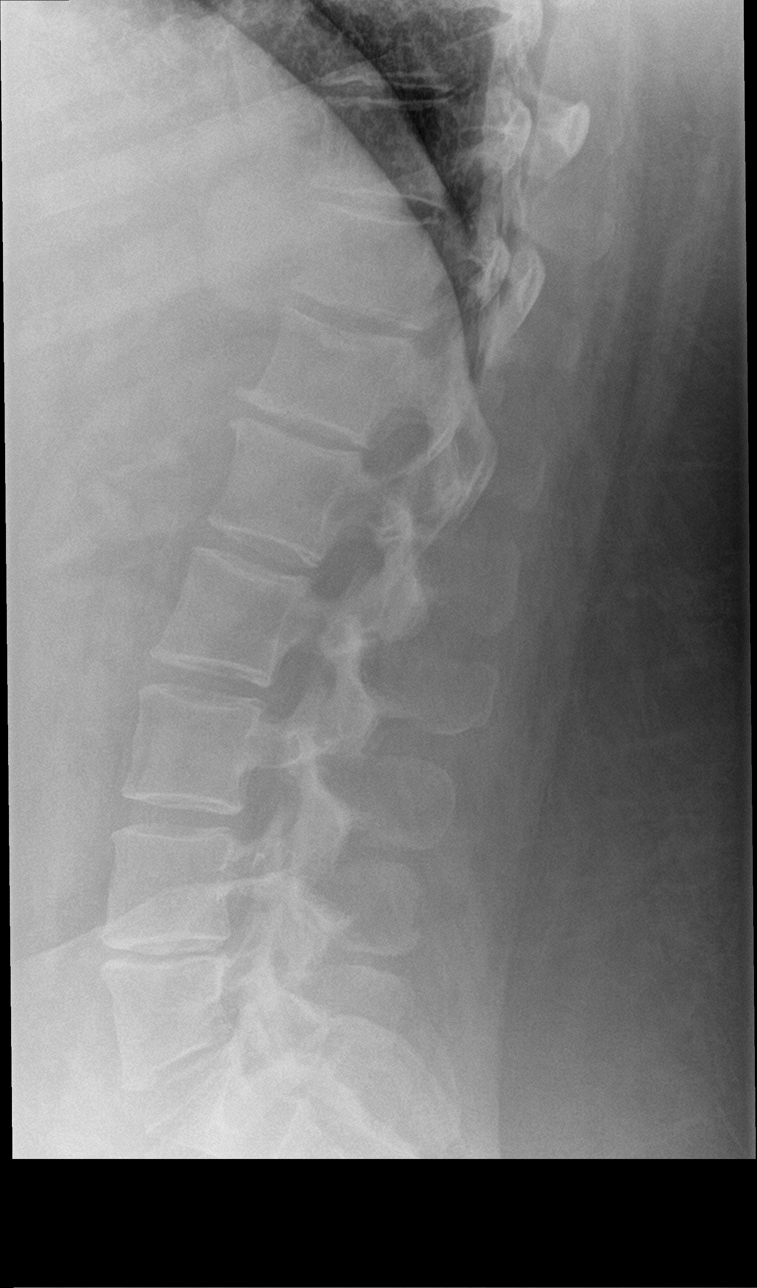

[l-spine spot]
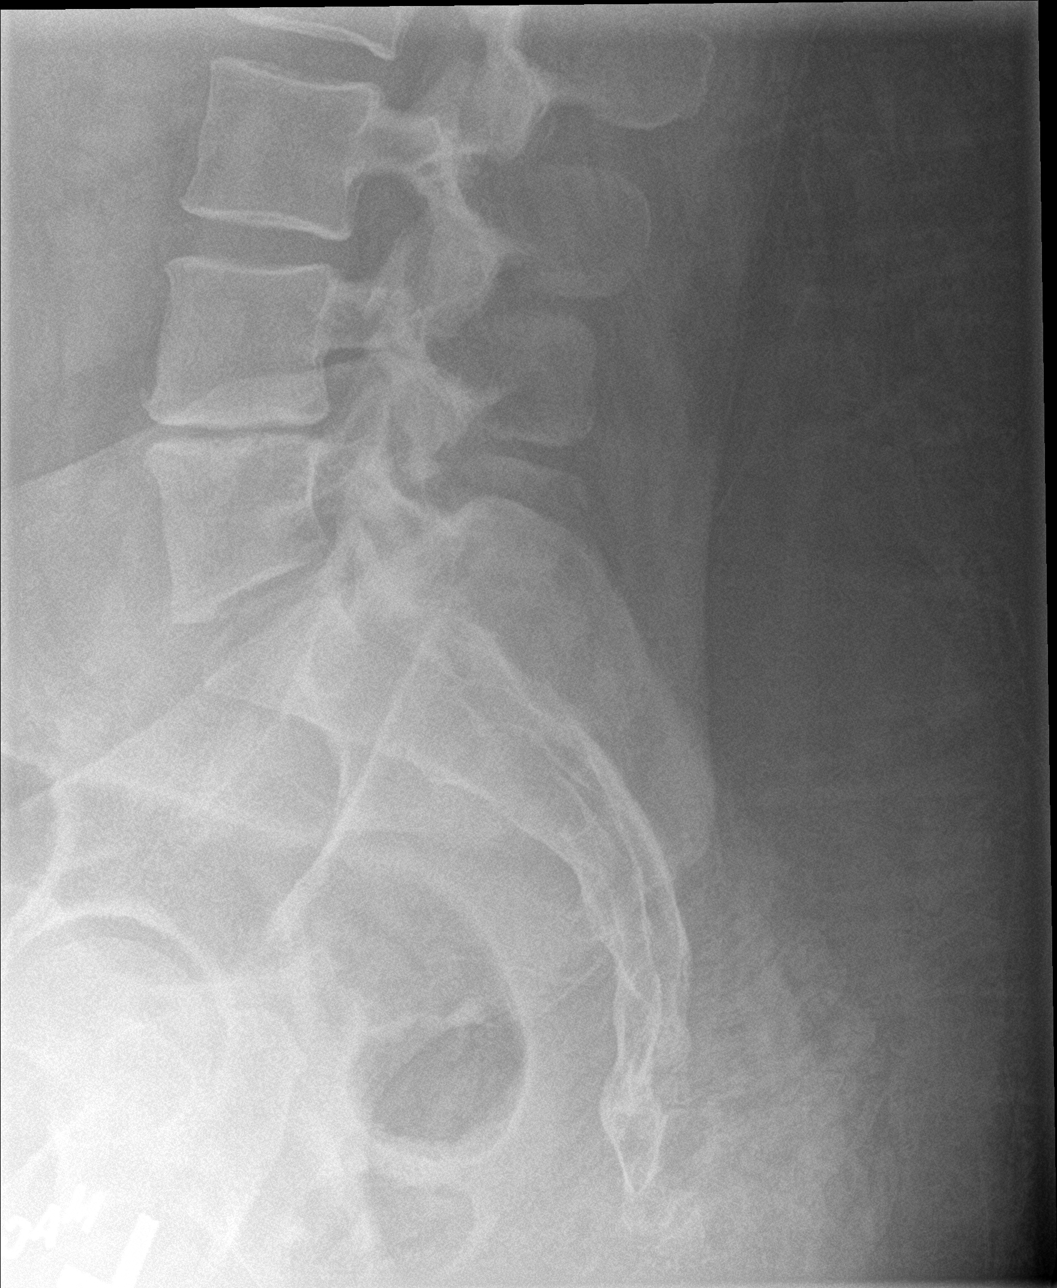

[5 of 5 positions shown; findings below may reference images not displayed]

FINDINGS: There is no acute displaced fracture. There are mild multilevel
degenerative changes throughout the lumbar spine. There is no
significant malalignment.
IMPRESSION: No acute osseous abnormality.

## 2021-04-29 ENCOUNTER — Ambulatory Visit: Payer: Medicare Other | Admitting: Cardiology

## 2021-05-24 ENCOUNTER — Ambulatory Visit: Payer: Medicare Other | Admitting: Cardiology

## 2021-06-28 ENCOUNTER — Ambulatory Visit: Payer: Medicare Other

## 2021-06-28 DIAGNOSIS — R072 Precordial pain: Secondary | ICD-10-CM

## 2021-06-29 LAB — PCV MYOCARDIAL PERFUSION WITH LEXISCAN
Base ST Depression (mm): 0 mm
ST Depression (mm): 0 mm

## 2021-06-29 NOTE — Progress Notes (Signed)
Called pt to inform her about her stress test pt will make an appt for a f/u with you

## 2021-07-07 ENCOUNTER — Ambulatory Visit: Payer: Medicare Other | Admitting: Cardiology

## 2021-07-12 ENCOUNTER — Ambulatory Visit: Payer: Medicare Other | Admitting: Cardiology

## 2021-07-29 ENCOUNTER — Ambulatory Visit: Payer: Medicare Other | Admitting: Cardiology

## 2021-10-26 ENCOUNTER — Encounter: Payer: Medicare Other | Admitting: Family Medicine

## 2021-11-30 ENCOUNTER — Encounter: Payer: Medicare Other | Admitting: Family Medicine

## 2021-12-10 IMAGING — DX DG CHEST 1V PORT
1 series · 1 of 1 positions shown · non-contrast
Comparison: 08/06/2019 chest radiograph.

CLINICAL DATA: Cough, dyspnea

EXAM:
PORTABLE CHEST 1 VIEW

[chest ap]
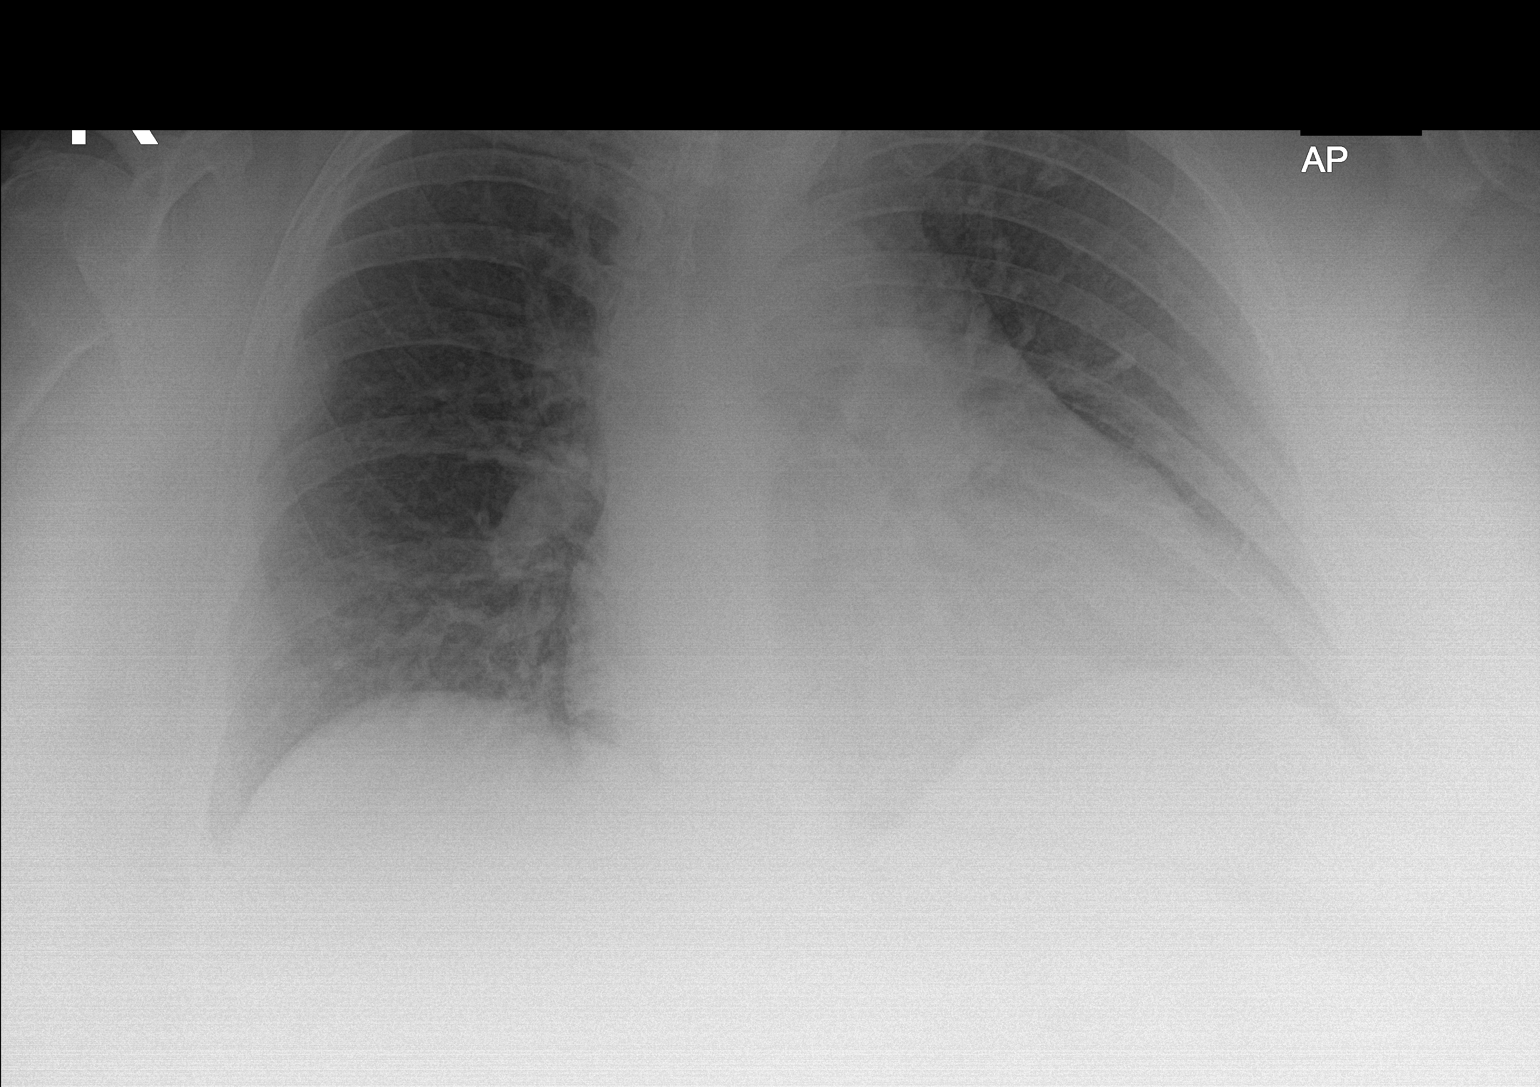

[1 of 1 positions shown; findings below may reference images not displayed]

FINDINGS: Stable cardiomediastinal silhouette with mild to moderate
cardiomegaly. No pneumothorax. No pleural effusion. Borderline mild
pulmonary edema. No acute consolidative airspace disease.
IMPRESSION: Borderline mild congestive heart failure.

## 2022-03-25 DEATH — deceased

## 2022-10-06 NOTE — Telephone Encounter (Signed)
This encounter was created in error - please disregard.
